# Patient Record
Sex: Female | Born: 1955 | Race: White | Hispanic: No | Marital: Married | Smoking: Former smoker
Health system: Southern US, Community
[De-identification: ages and names within clinical notes are randomized; demographics above are authoritative.]

## PROBLEM LIST (undated history)

## (undated) DIAGNOSIS — Z8719 Personal history of other diseases of the digestive system: Secondary | ICD-10-CM

## (undated) DIAGNOSIS — Z8041 Family history of malignant neoplasm of ovary: Secondary | ICD-10-CM

## (undated) DIAGNOSIS — G43909 Migraine, unspecified, not intractable, without status migrainosus: Secondary | ICD-10-CM

## (undated) DIAGNOSIS — E785 Hyperlipidemia, unspecified: Secondary | ICD-10-CM

## (undated) DIAGNOSIS — I10 Essential (primary) hypertension: Secondary | ICD-10-CM

## (undated) DIAGNOSIS — Z803 Family history of malignant neoplasm of breast: Secondary | ICD-10-CM

## (undated) DIAGNOSIS — K219 Gastro-esophageal reflux disease without esophagitis: Secondary | ICD-10-CM

## (undated) DIAGNOSIS — E119 Type 2 diabetes mellitus without complications: Secondary | ICD-10-CM

## (undated) HISTORY — PX: BREAST LUMPECTOMY: SHX2

## (undated) HISTORY — PX: TUBAL LIGATION: SHX77

## (undated) HISTORY — DX: Hyperlipidemia, unspecified: E78.5

## (undated) HISTORY — DX: Family history of malignant neoplasm of ovary: Z80.41

## (undated) HISTORY — DX: Family history of malignant neoplasm of breast: Z80.3

## (undated) HISTORY — PX: EXCISION OF BREAST BIOPSY: SHX5822

---

## 1999-04-14 HISTORY — PX: BREAST BIOPSY: SHX20

## 1999-06-19 HISTORY — PX: BREAST EXCISIONAL BIOPSY: SUR124

## 1999-06-19 HISTORY — PX: BREAST BIOPSY: SHX20

## 2004-09-25 ENCOUNTER — Ambulatory Visit: Payer: Self-pay | Admitting: Family Medicine

## 2006-05-20 ENCOUNTER — Ambulatory Visit: Payer: Self-pay | Admitting: Family Medicine

## 2007-07-19 ENCOUNTER — Ambulatory Visit: Payer: Self-pay | Admitting: Family Medicine

## 2008-11-01 ENCOUNTER — Ambulatory Visit: Payer: Self-pay | Admitting: Family Medicine

## 2009-02-15 ENCOUNTER — Ambulatory Visit: Payer: Self-pay | Admitting: Unknown Physician Specialty

## 2009-02-15 HISTORY — PX: COLONOSCOPY: SHX174

## 2009-12-03 ENCOUNTER — Ambulatory Visit: Payer: Self-pay | Admitting: Family Medicine

## 2010-12-11 ENCOUNTER — Ambulatory Visit: Payer: Self-pay | Admitting: Family Medicine

## 2014-06-20 IMAGING — US US BREAST*R* LIMITED INC AXILLA
1 series · 7 of 7 positions shown · non-contrast
Comparison: [DATE]

CLINICAL DATA: The patient returns after screening study for
evaluation of possible asymmetry is in the right breast.

EXAM:
2D DIGITAL DIAGNOSTIC RIGHT MAMMOGRAM WITH CAD AND ADJUNCT TOMO
ULTRASOUND RIGHT BREAST

[Series 1: us breast*right* limited inc axilla · 0.07mm/px · 7 of 7 slices shown]
[im 1/7]
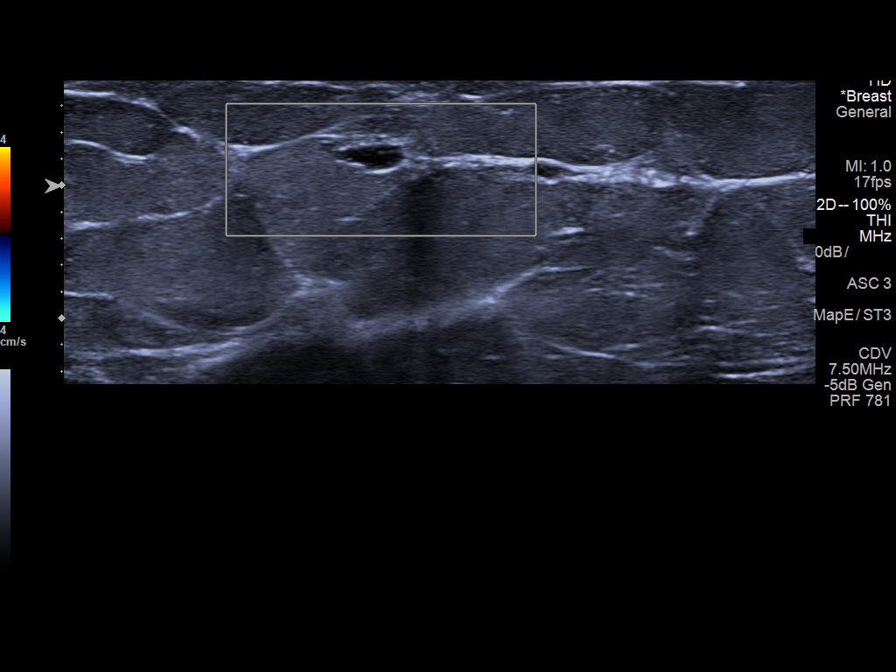
[im 2/7]
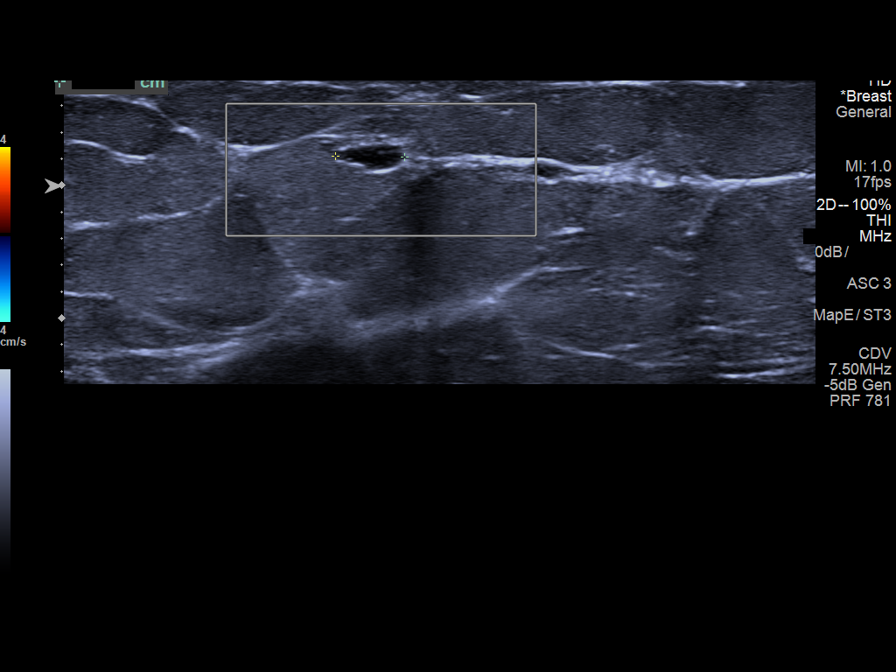
[im 3/7]
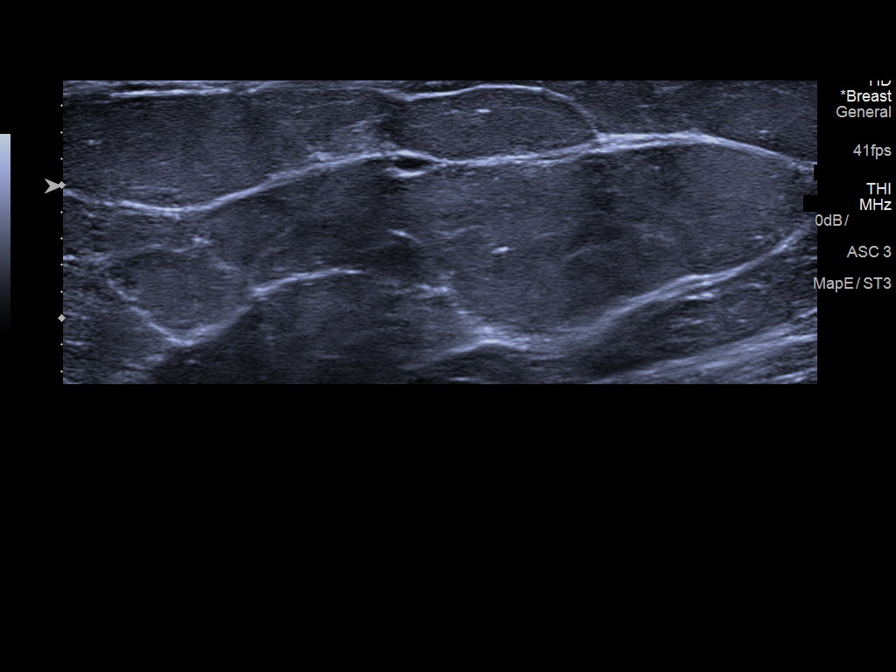
[im 4/7]
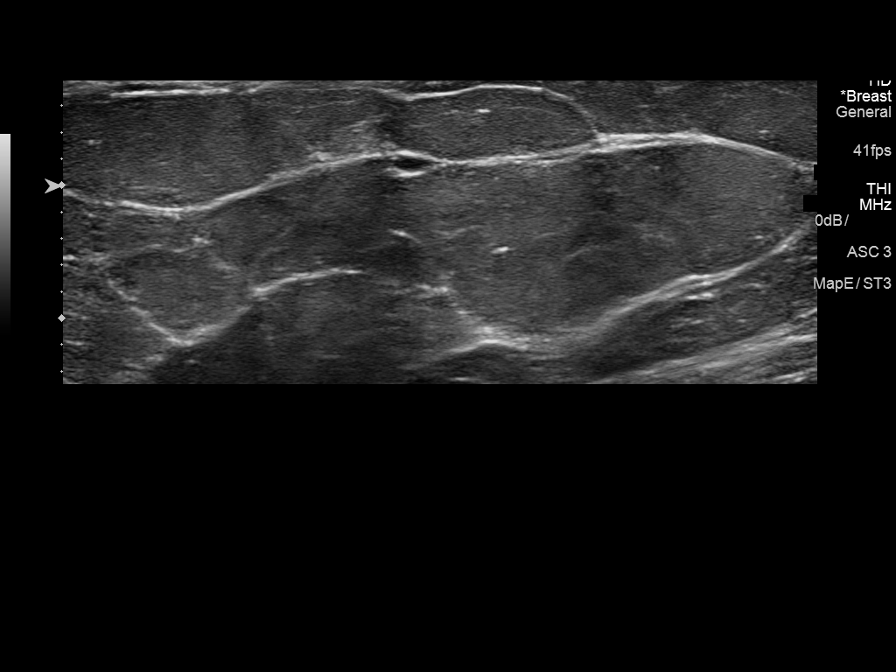
[im 5/7]
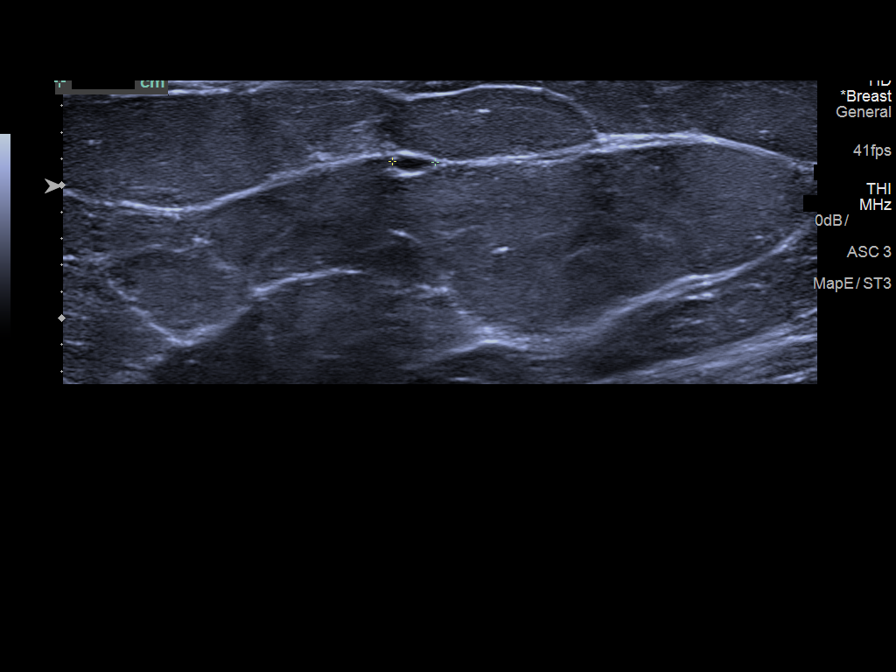
[im 6/7]
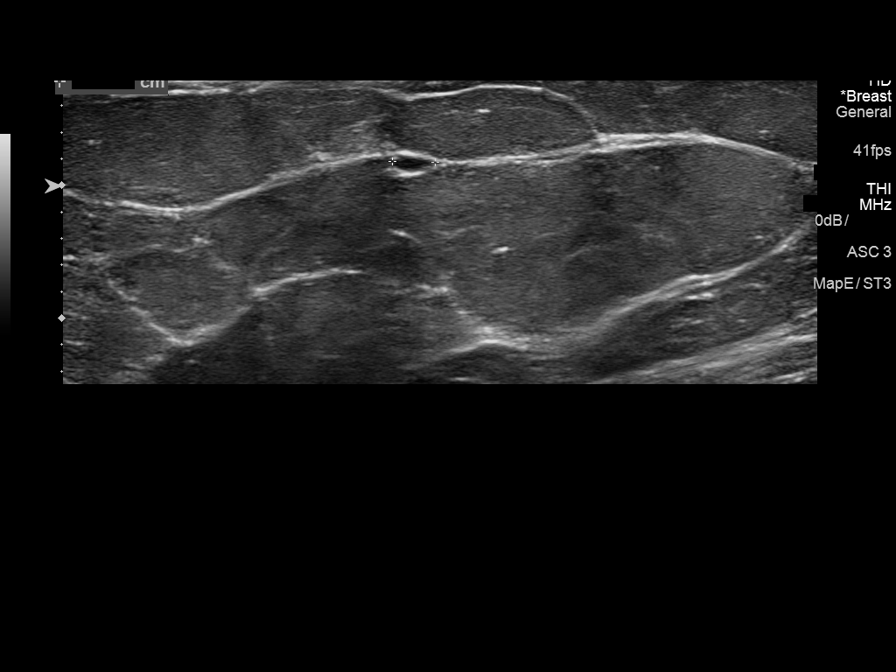
[im 7/7]
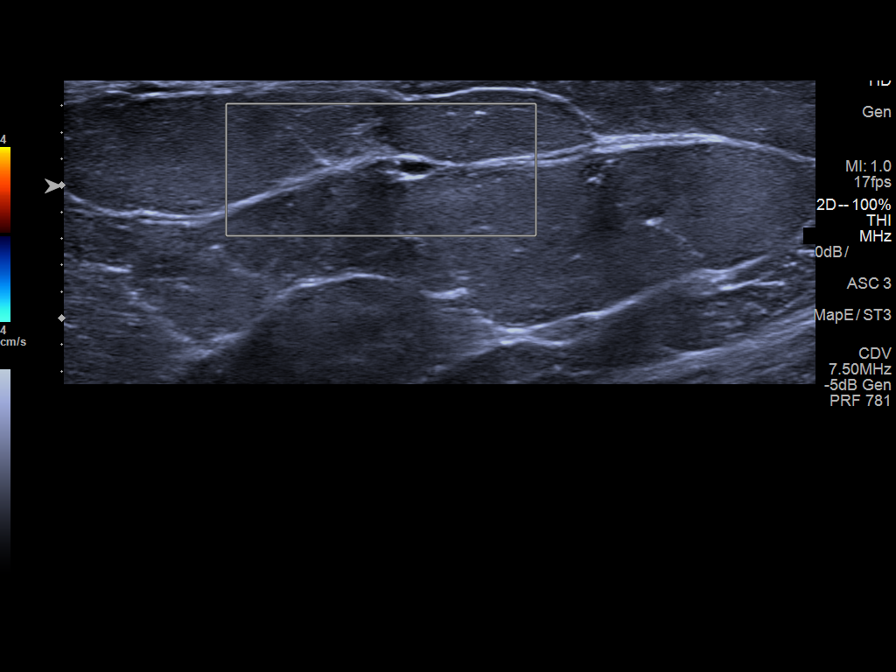

[7 of 7 positions shown; findings below may reference images not displayed]

ACR Breast Density Category b: There are scattered areas of
fibroglandular density.
FINDINGS: Additional tomosynthesis images are performed, showing persistent
circumscribed mass in the superficial medial portion of the right
breast. The second area of concern is shown represent normal
appearing breast parenchyma in the more central portion of the
breast.

Mammographic images were processed with CAD.

On physical exam, I palpate no abnormality in the medial aspect of
the right breast.

Targeted ultrasound is performed, showing a small oval hypoechoic
mass in the 3 o'clock location of the right breast 3 cm from the
nipple which measures 0.5 x 0.3 cm. Internal echoes are present. No
internal blood flow identified on Doppler evaluation. Lesion is
parallel and associated with increased through transmission.
Findings favor benign fibrocystic change/ apocrine metaplasia.
IMPRESSION: Persistent abnormality in the medial aspect of the right breast
likely represents benign apocrine metaplasia or fibrocystic changes.
Followup is recommended to document stability.

RECOMMENDATION:
Right breast ultrasound is suggested in 6 months.

I have discussed the findings and recommendations with the patient.
Results were also provided in writing at the conclusion of the
visit. If applicable, a reminder letter will be sent to the patient
regarding the next appointment.

BI-RADS CATEGORY  3: Probably benign.

## 2015-07-31 ENCOUNTER — Other Ambulatory Visit: Payer: Self-pay | Admitting: Family Medicine

## 2015-07-31 DIAGNOSIS — Z1231 Encounter for screening mammogram for malignant neoplasm of breast: Secondary | ICD-10-CM

## 2015-08-07 ENCOUNTER — Ambulatory Visit
Admission: RE | Admit: 2015-08-07 | Discharge: 2015-08-07 | Disposition: A | Discharge status: Home / Self Care | Payer: Managed Care, Other (non HMO) | Source: Ambulatory Visit | Attending: Family Medicine | Admitting: Family Medicine

## 2015-08-07 DIAGNOSIS — Z1231 Encounter for screening mammogram for malignant neoplasm of breast: Secondary | ICD-10-CM | POA: Insufficient documentation

## 2015-08-07 DIAGNOSIS — R928 Other abnormal and inconclusive findings on diagnostic imaging of breast: Secondary | ICD-10-CM | POA: Insufficient documentation

## 2015-08-07 IMAGING — MG MM DIGITAL SCREENING BILAT W/ CAD
2 series · 5 of 5 positions shown · non-contrast
Comparison: Previous exam(s).

CLINICAL DATA: Screening.

EXAM:
DIGITAL SCREENING BILATERAL MAMMOGRAM WITH CAD

[R CC · right · 4 of 4 slices shown (1 of 2)]
[im 1/4]
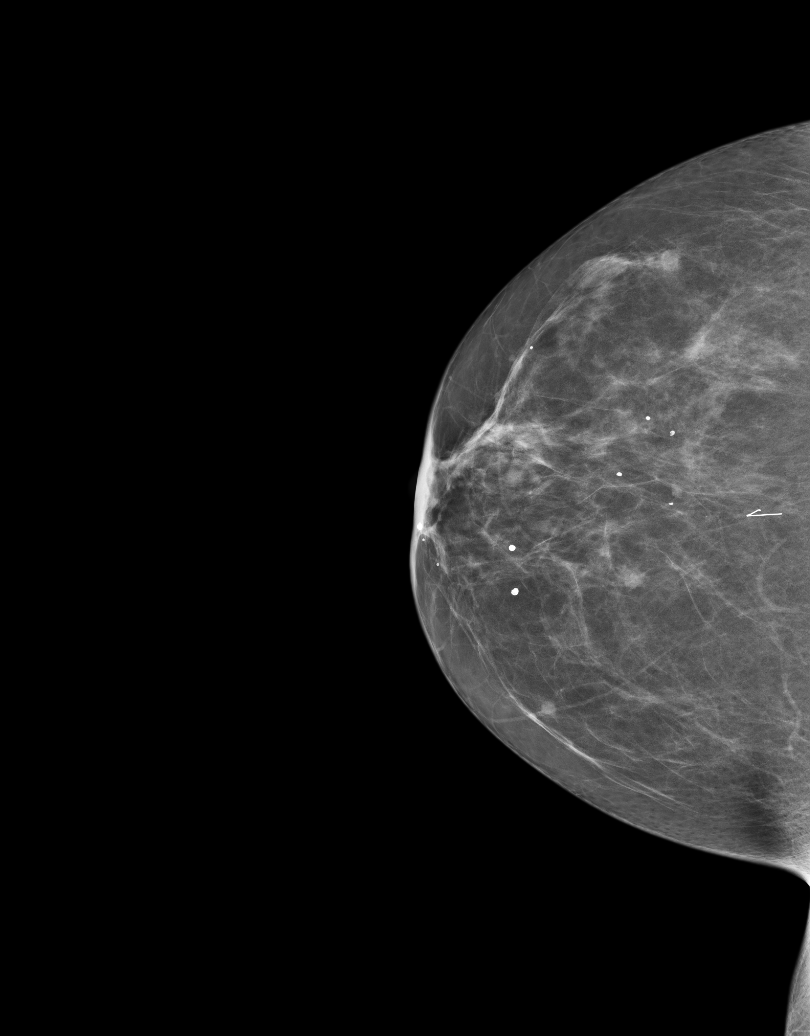
[im 2/4]
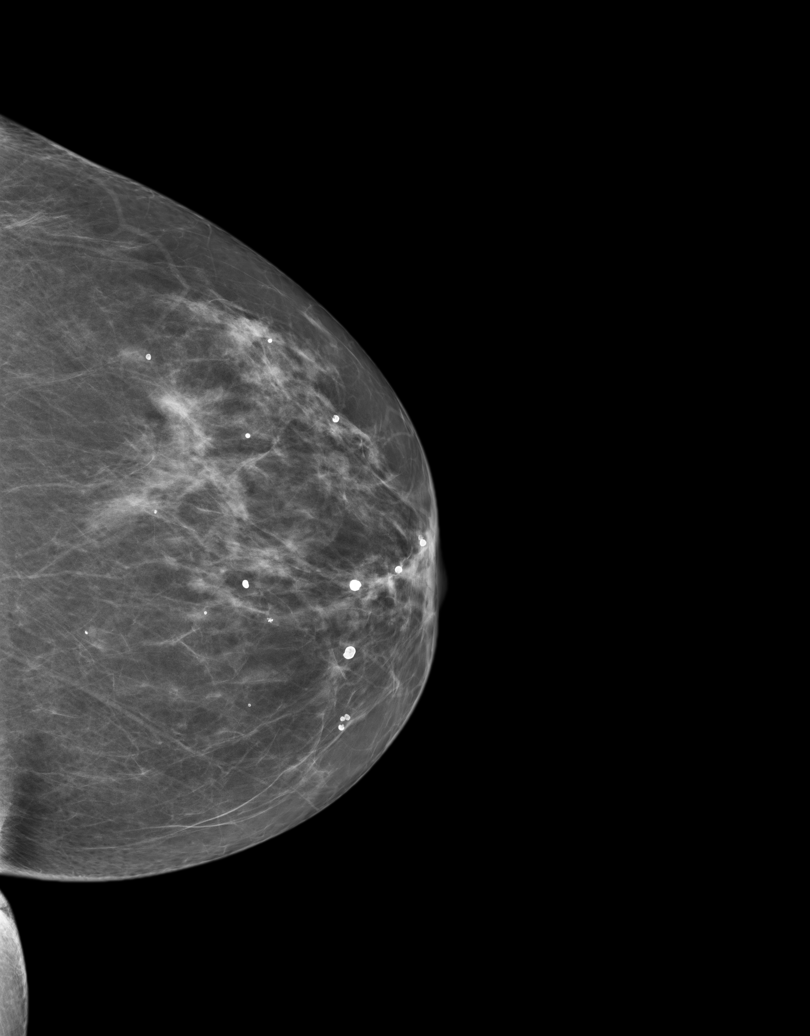
[im 3/4]
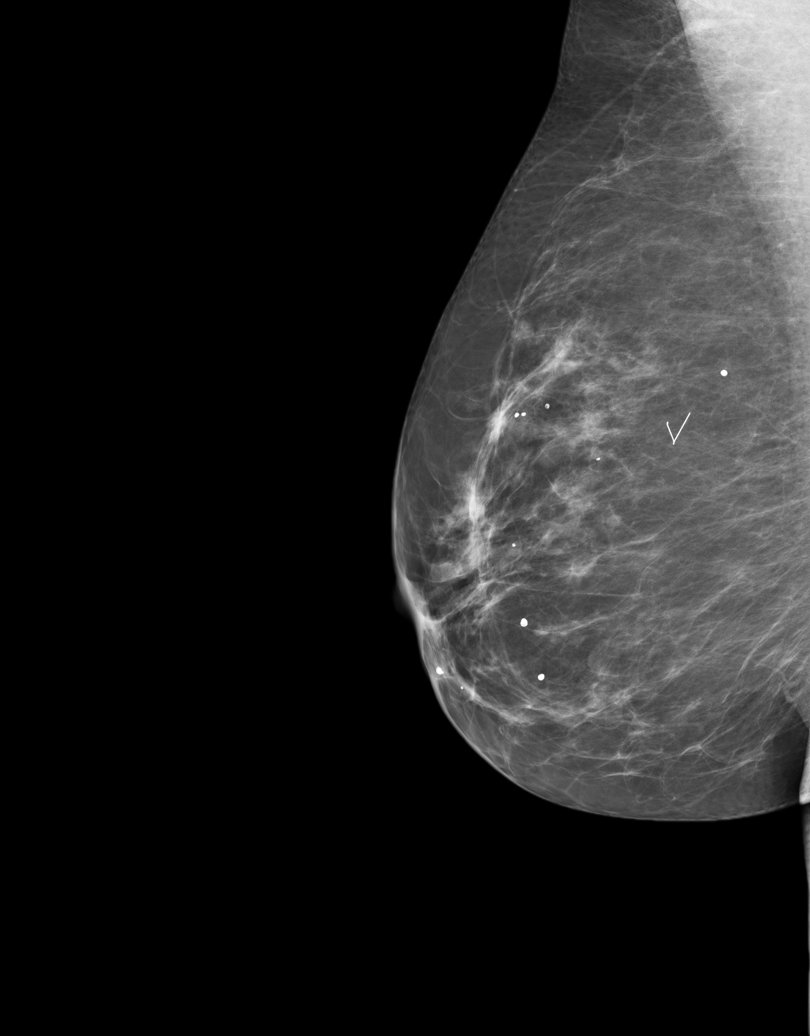
[im 4/4]
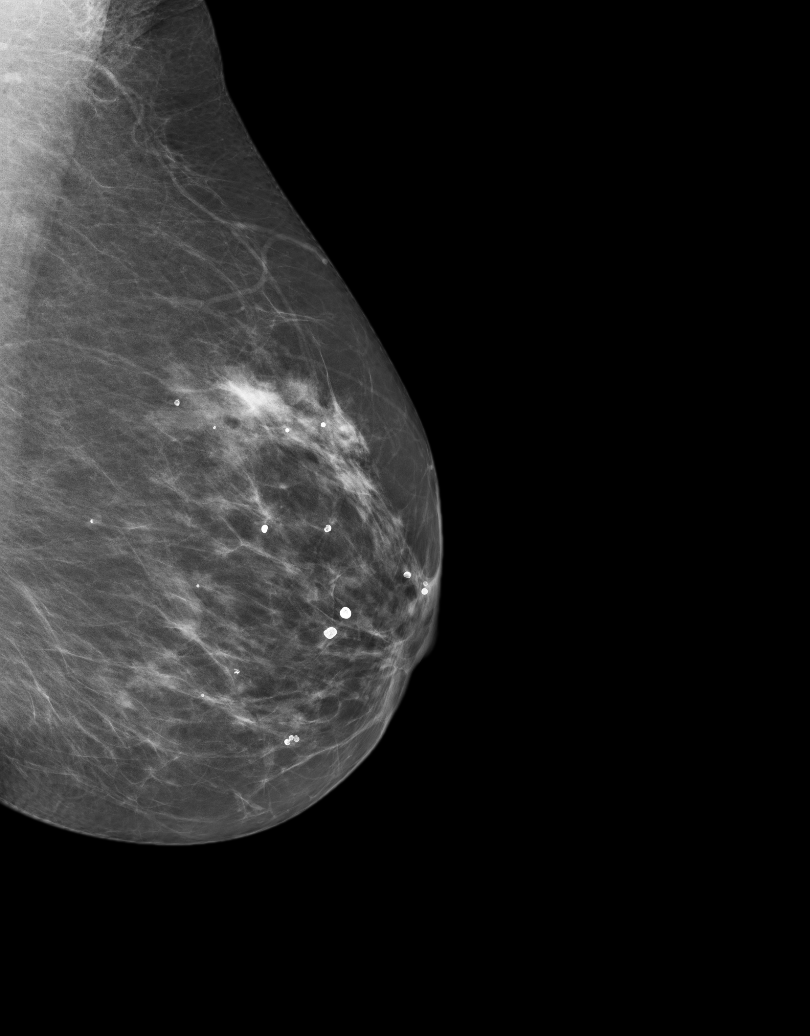

[R CC (2 of 2)]
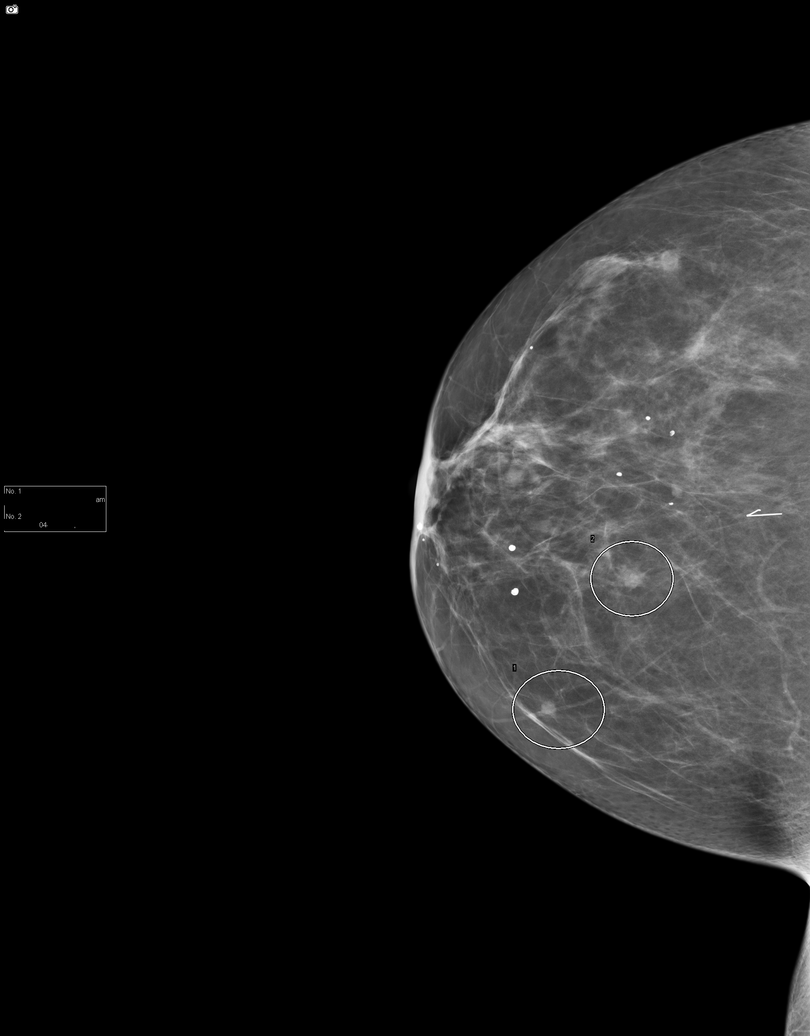

[5 of 5 positions shown; findings below may reference images not displayed]

ACR Breast Density Category b: There are scattered areas of
fibroglandular density.
FINDINGS: In the right breast, possible asymmetries warrant further
evaluation. In the left breast, no findings suspicious for
malignancy. Images were processed with CAD.
IMPRESSION: Further evaluation is suggested for possible asymmetries in the
right breast.

RECOMMENDATION:
Diagnostic mammogram and possibly ultrasound of the right breast.
(Code:[H2])

The patient will be contacted regarding the findings, and additional
imaging will be scheduled.

BI-RADS CATEGORY  0: Incomplete. Need additional imaging evaluation
and/or prior mammograms for comparison.

## 2015-08-26 ENCOUNTER — Other Ambulatory Visit: Payer: Self-pay | Admitting: Family Medicine

## 2015-08-26 DIAGNOSIS — R928 Other abnormal and inconclusive findings on diagnostic imaging of breast: Secondary | ICD-10-CM

## 2015-09-02 ENCOUNTER — Ambulatory Visit
Admission: RE | Admit: 2015-09-02 | Discharge: 2015-09-02 | Disposition: A | Discharge status: Home / Self Care | Payer: Managed Care, Other (non HMO) | Source: Ambulatory Visit | Attending: Family Medicine | Admitting: Family Medicine

## 2015-09-02 DIAGNOSIS — R928 Other abnormal and inconclusive findings on diagnostic imaging of breast: Secondary | ICD-10-CM

## 2015-09-02 DIAGNOSIS — N63 Unspecified lump in breast: Secondary | ICD-10-CM | POA: Insufficient documentation

## 2015-09-02 IMAGING — MG MM DIGITAL DIAGNOSTIC UNILAT*R* W/ TOMO W/ CAD
6 series · 6 of 14 positions shown · non-contrast
Comparison: [DATE]

CLINICAL DATA: The patient returns after screening study for
evaluation of possible asymmetry is in the right breast.

EXAM:
2D DIGITAL DIAGNOSTIC RIGHT MAMMOGRAM WITH CAD AND ADJUNCT TOMO
ULTRASOUND RIGHT BREAST

[R MLO synth-2D]
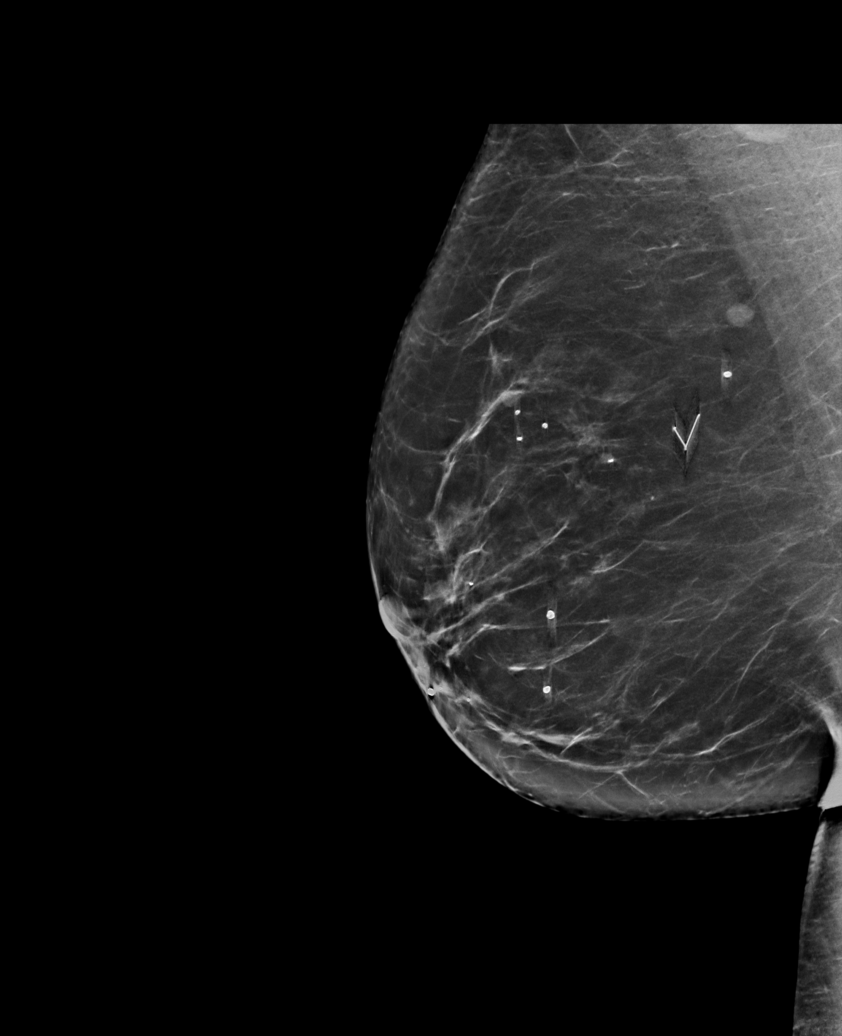

[R CC]
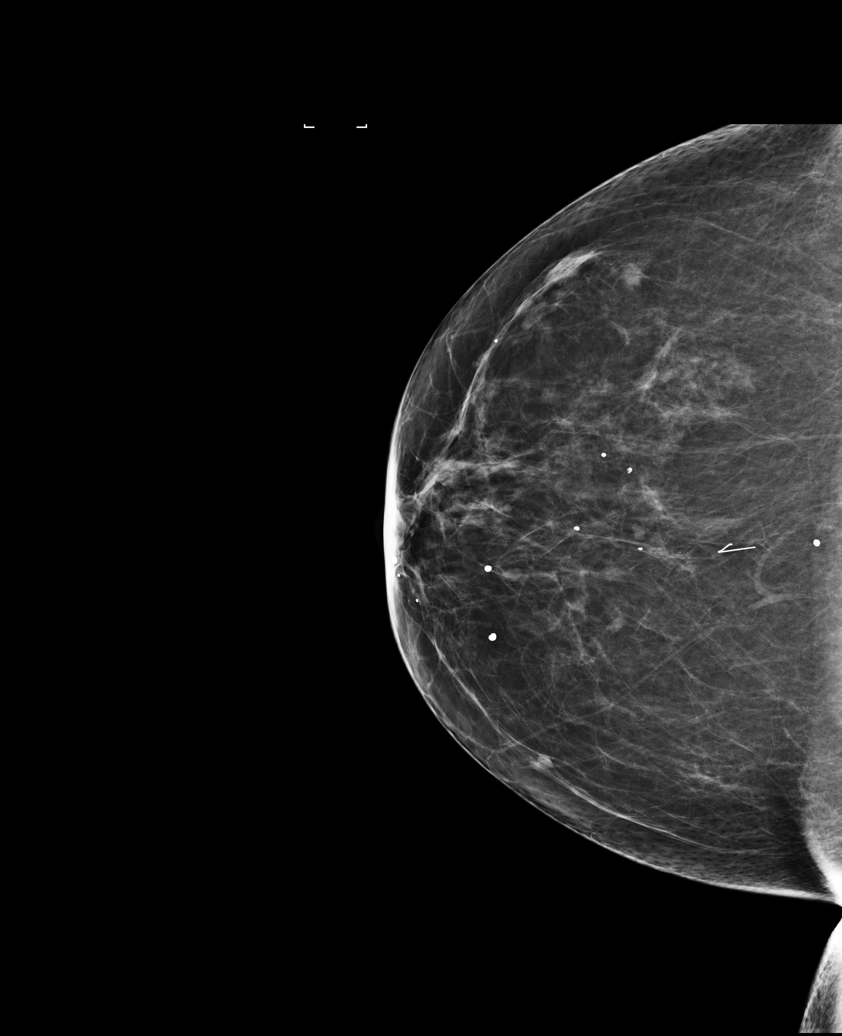

[R CC synth-2D]
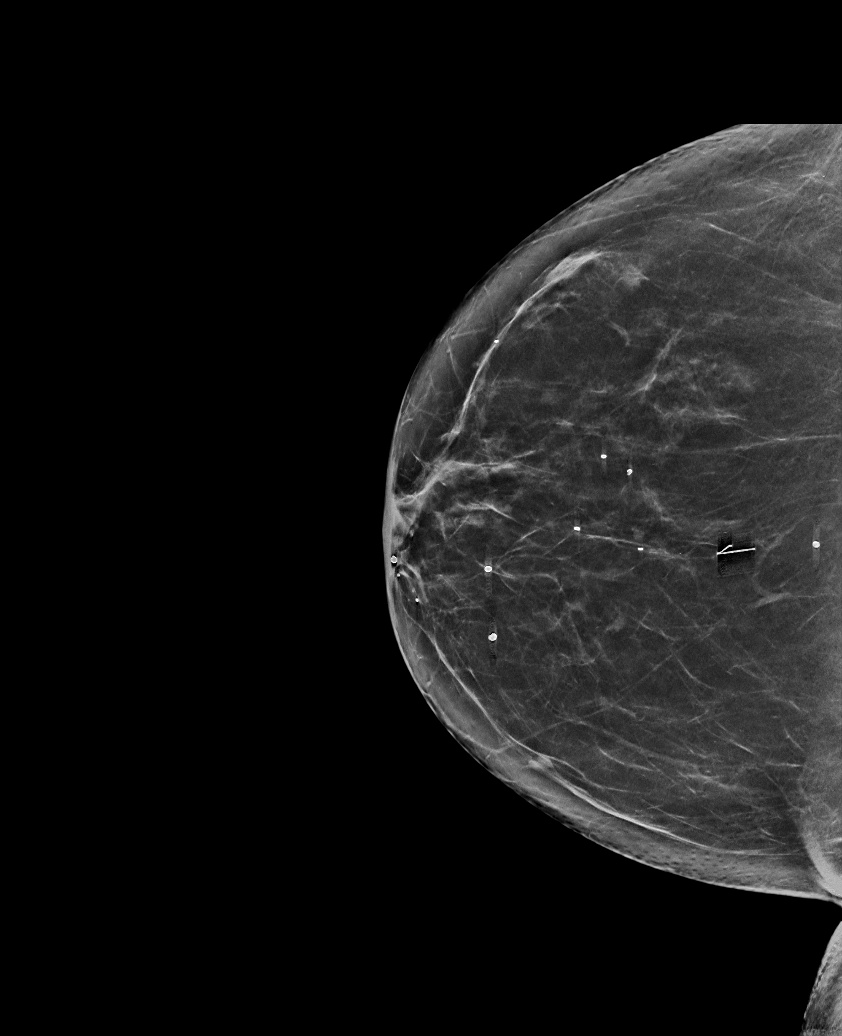

[R MLO]
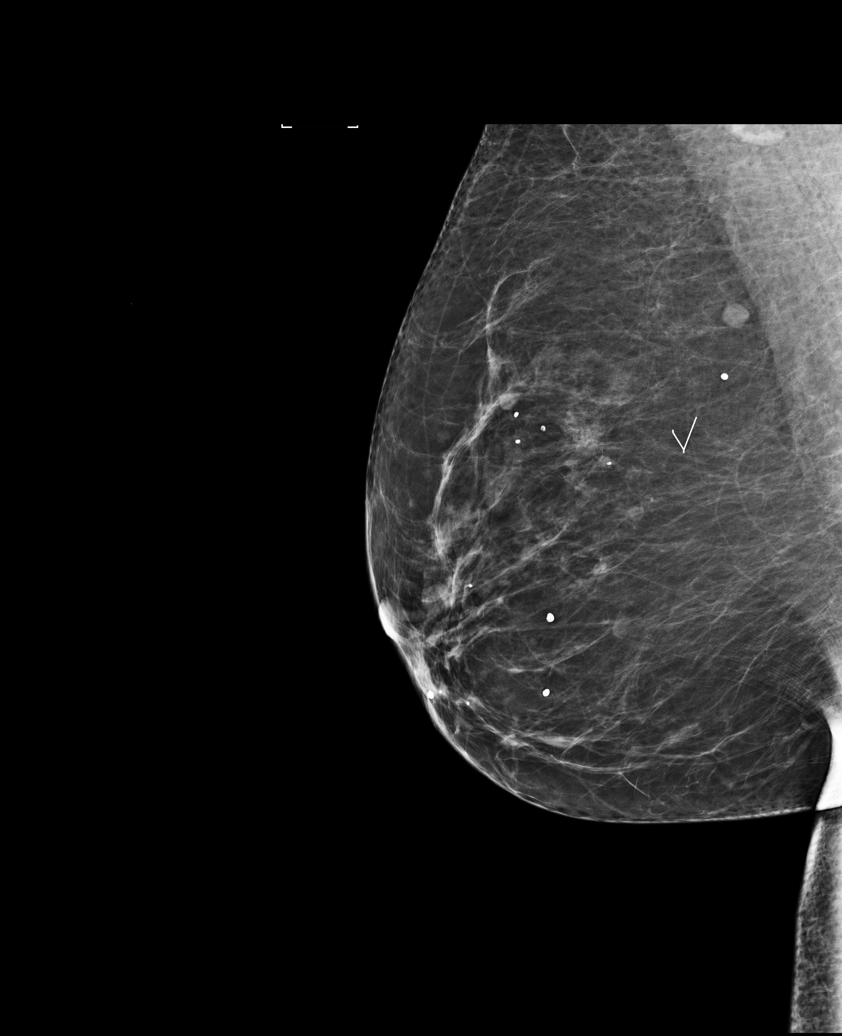

[R MLO tomo · tomo slice 41/80.0]
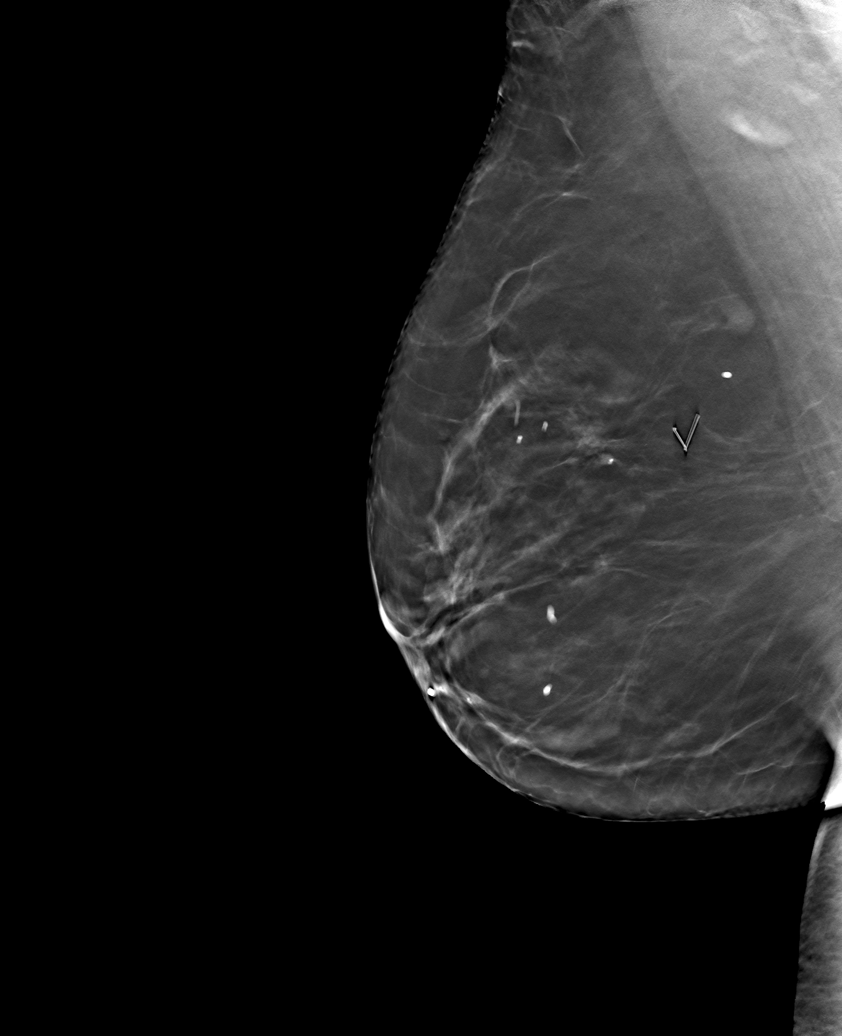

[R CC tomo · tomo slice 35/69.0]
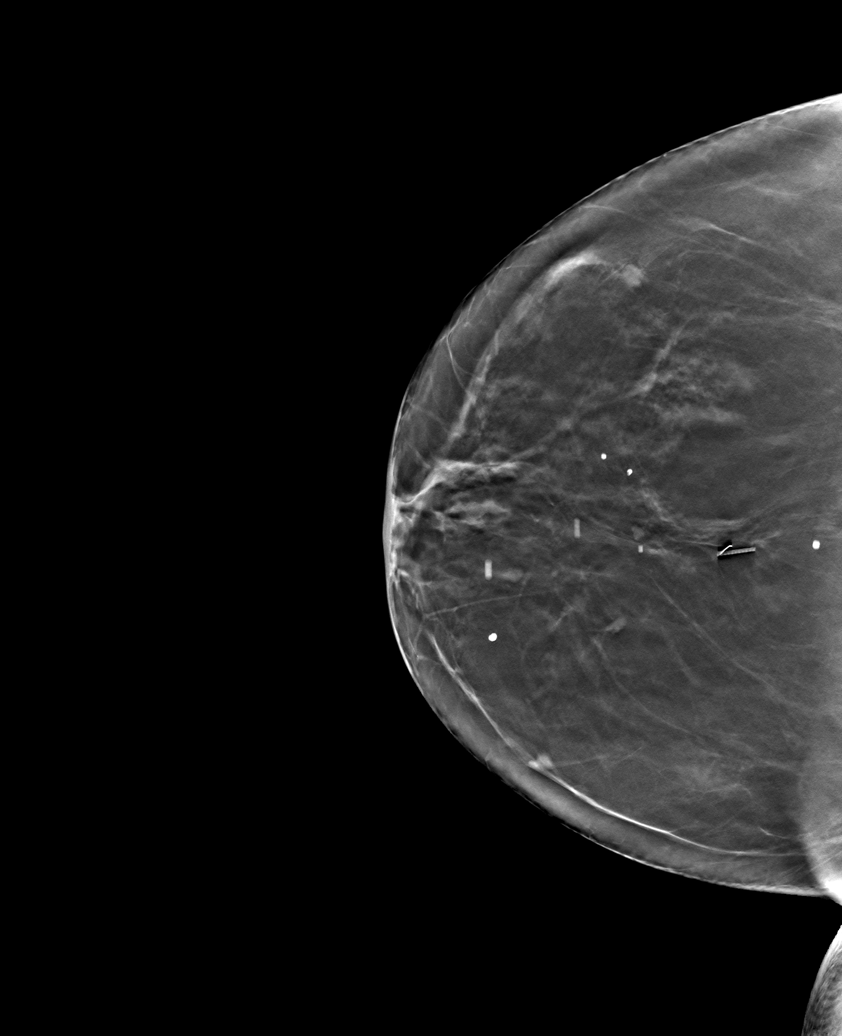

[6 of 14 positions shown; findings below may reference images not displayed]

ACR Breast Density Category b: There are scattered areas of
fibroglandular density.
FINDINGS: Additional tomosynthesis images are performed, showing persistent
circumscribed mass in the superficial medial portion of the right
breast. The second area of concern is shown represent normal
appearing breast parenchyma in the more central portion of the
breast.

Mammographic images were processed with CAD.

On physical exam, I palpate no abnormality in the medial aspect of
the right breast.

Targeted ultrasound is performed, showing a small oval hypoechoic
mass in the 3 o'clock location of the right breast 3 cm from the
nipple which measures 0.5 x 0.3 cm. Internal echoes are present. No
internal blood flow identified on Doppler evaluation. Lesion is
parallel and associated with increased through transmission.
Findings favor benign fibrocystic change/ apocrine metaplasia.
IMPRESSION: Persistent abnormality in the medial aspect of the right breast
likely represents benign apocrine metaplasia or fibrocystic changes.
Followup is recommended to document stability.

RECOMMENDATION:
Right breast ultrasound is suggested in 6 months.

I have discussed the findings and recommendations with the patient.
Results were also provided in writing at the conclusion of the
visit. If applicable, a reminder letter will be sent to the patient
regarding the next appointment.

BI-RADS CATEGORY  3: Probably benign.

## 2016-08-03 ENCOUNTER — Other Ambulatory Visit: Payer: Self-pay | Admitting: Family Medicine

## 2016-08-03 DIAGNOSIS — Z1231 Encounter for screening mammogram for malignant neoplasm of breast: Secondary | ICD-10-CM

## 2016-08-11 ENCOUNTER — Other Ambulatory Visit: Payer: Self-pay | Admitting: Family Medicine

## 2016-08-11 DIAGNOSIS — N6019 Diffuse cystic mastopathy of unspecified breast: Secondary | ICD-10-CM

## 2017-03-16 ENCOUNTER — Ambulatory Visit
Admission: RE | Admit: 2017-03-16 | Discharge: 2017-03-16 | Disposition: A | Discharge status: Home / Self Care | Payer: Managed Care, Other (non HMO) | Source: Ambulatory Visit | Attending: Family Medicine | Admitting: Family Medicine

## 2017-03-16 DIAGNOSIS — N6019 Diffuse cystic mastopathy of unspecified breast: Secondary | ICD-10-CM

## 2017-03-16 IMAGING — MG 2D DIGITAL DIAGNOSTIC BILATERAL MAMMOGRAM WITH CAD AND ADJUNCT T
8 of 12 series · 8 of 28 positions shown · non-contrast
Comparison: Previous exam(s).

CLINICAL DATA: Follow-up for probably benign mass within the inner
right breast. This mass was originally identified on screening
mammogram of [DATE]. Diagnostic report of [DATE] described
this mass as a probably benign finding, likely apocrine metaplasia
or fibrocystic change, for which a follow-up diagnostic exam was
recommended in 6 months to ensure stability. Patient returns today
for the follow-up diagnostic exam.

EXAM:
2D DIGITAL DIAGNOSTIC BILATERAL MAMMOGRAM WITH CAD AND ADJUNCT TOMO

[R CC]
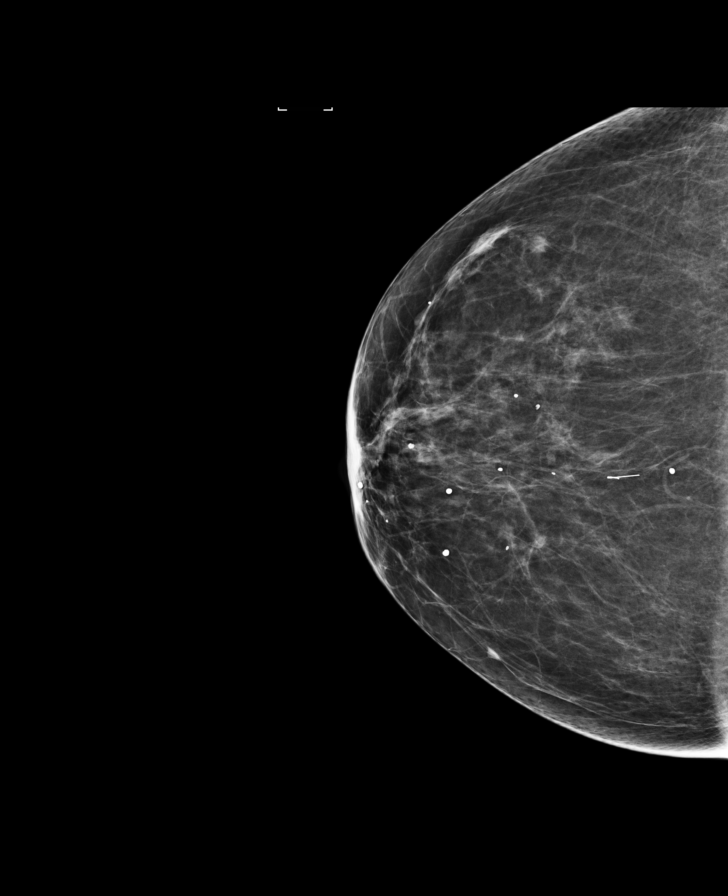

[L MLO]
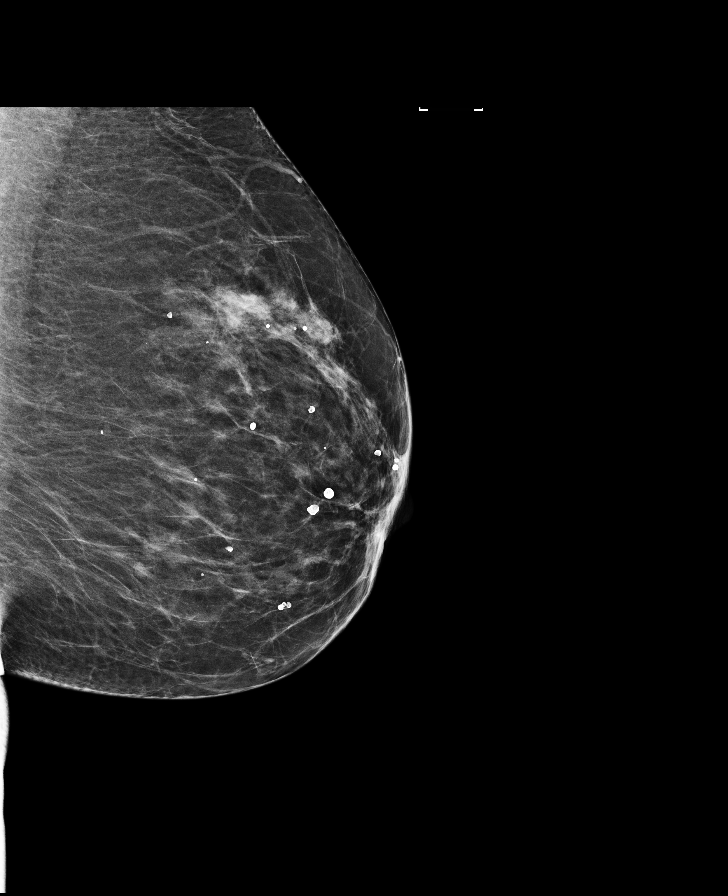

[L MLO synth-2D]
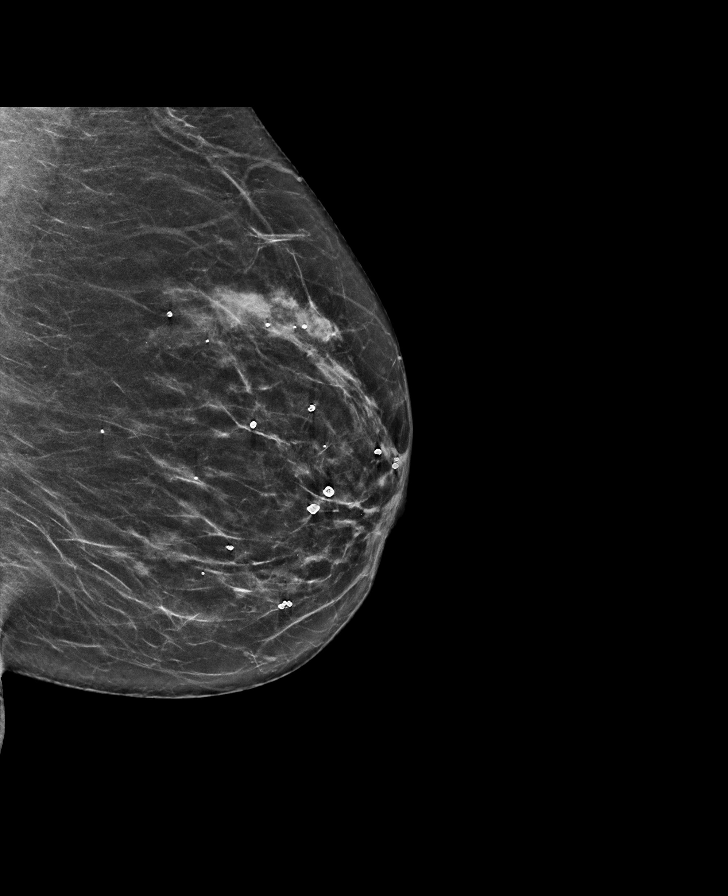

[R CC synth-2D]
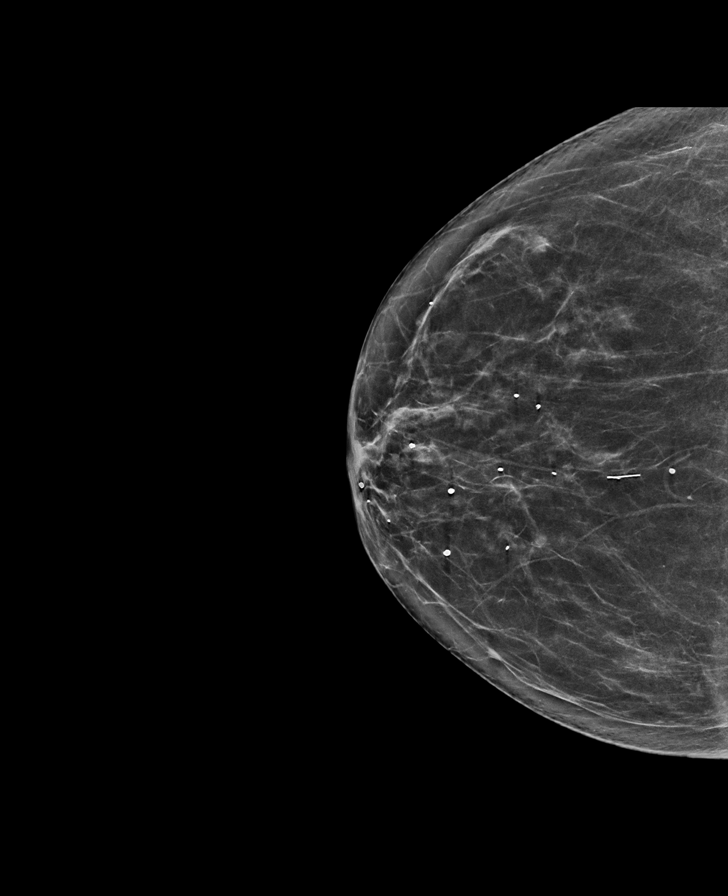

[L CC synth-2D]
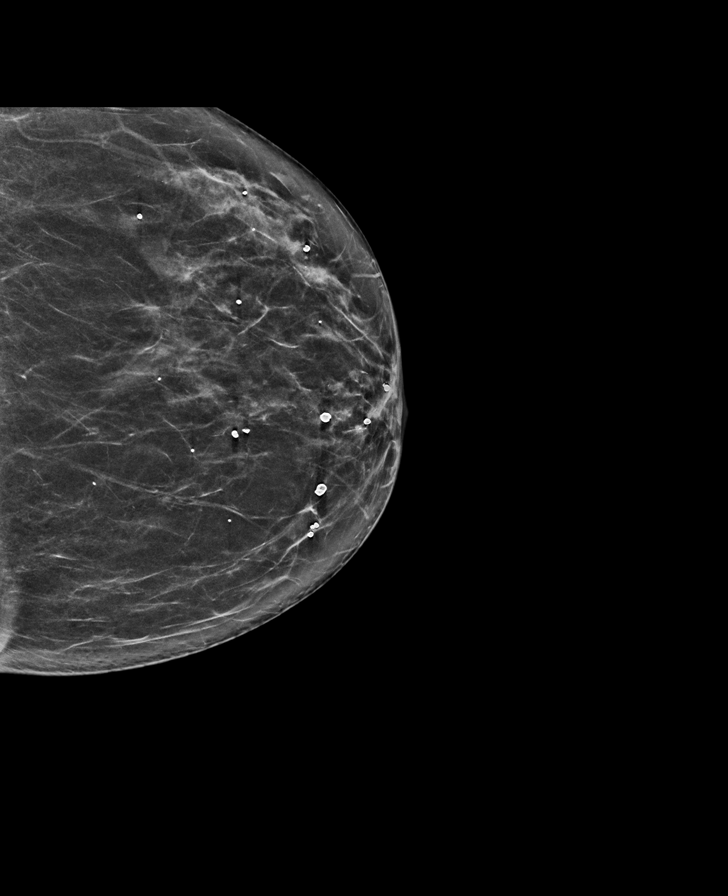

[R MLO]
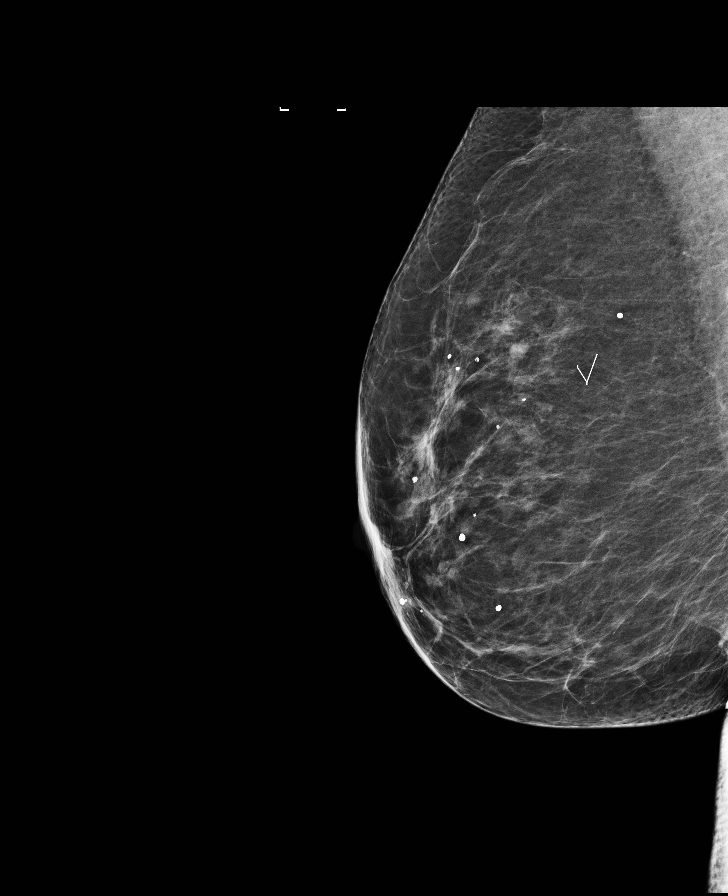

[R MLO synth-2D]
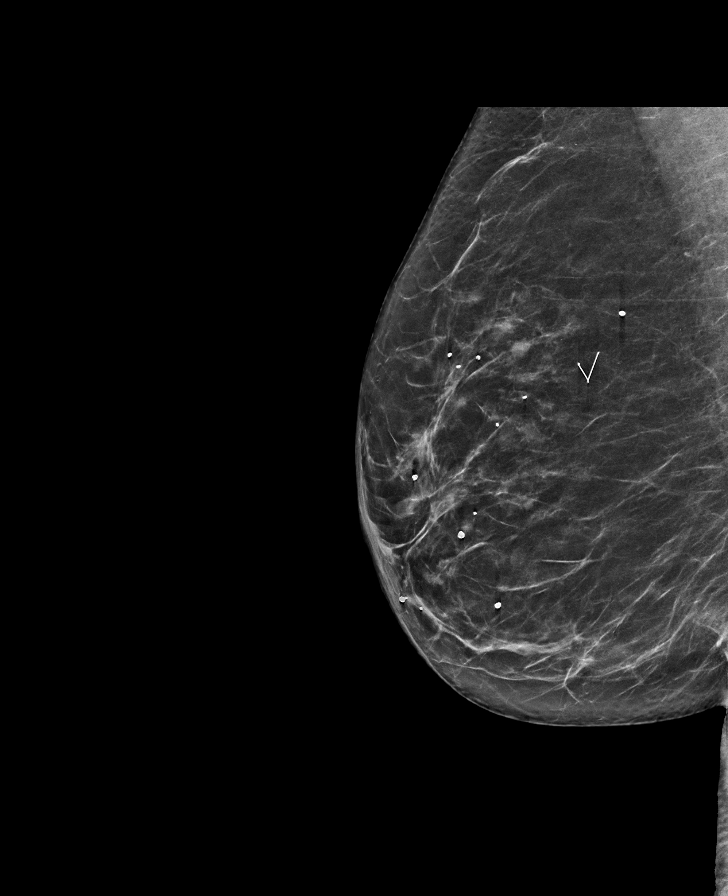

[L CC]
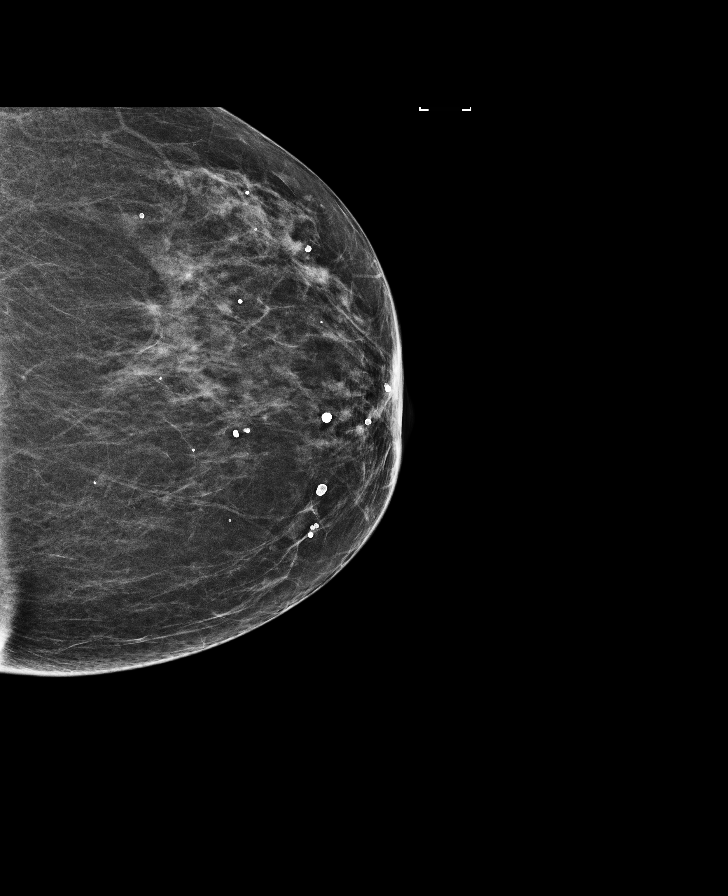

[8 of 28 positions shown; findings below may reference images not displayed]

ACR Breast Density Category b: There are scattered areas of
fibroglandular density.
FINDINGS: The previously questioned mass within the inner right breast is
stable, or perhaps slightly smaller, indicating benignity. There are
no new dominant masses, suspicious calcifications or secondary signs
of malignancy identified within either breast.

Mammographic images were processed with CAD.
IMPRESSION: No evidence of malignancy within either breast. Patient may return
to routine annual bilateral screening mammogram schedule.

RECOMMENDATION:
Screening mammogram in one year.(Code:[39])

I have discussed the findings and recommendations with the patient.
Results were also provided in writing at the conclusion of the
visit. If applicable, a reminder letter will be sent to the patient
regarding the next appointment.

BI-RADS CATEGORY  2: Benign.

## 2017-03-17 ENCOUNTER — Other Ambulatory Visit: Payer: Managed Care, Other (non HMO)

## 2018-03-02 ENCOUNTER — Other Ambulatory Visit: Payer: Self-pay | Admitting: Family Medicine

## 2018-03-02 DIAGNOSIS — Z1231 Encounter for screening mammogram for malignant neoplasm of breast: Secondary | ICD-10-CM

## 2018-03-21 ENCOUNTER — Encounter (INDEPENDENT_AMBULATORY_CARE_PROVIDER_SITE_OTHER): Payer: Self-pay

## 2018-03-21 ENCOUNTER — Ambulatory Visit
Admission: RE | Admit: 2018-03-21 | Discharge: 2018-03-21 | Disposition: A | Discharge status: Home / Self Care | Payer: BLUE CROSS/BLUE SHIELD | Source: Ambulatory Visit | Attending: Family Medicine | Admitting: Family Medicine

## 2018-03-21 DIAGNOSIS — Z1231 Encounter for screening mammogram for malignant neoplasm of breast: Secondary | ICD-10-CM | POA: Diagnosis not present

## 2018-03-21 IMAGING — MG DIGITAL SCREENING BILATERAL MAMMOGRAM WITH TOMO AND CAD
8 series · 8 of 24 positions shown · non-contrast
Comparison: Previous exam(s).

CLINICAL DATA: Screening.

EXAM:
DIGITAL SCREENING BILATERAL MAMMOGRAM WITH TOMO AND CAD

[L CC synth-2D]
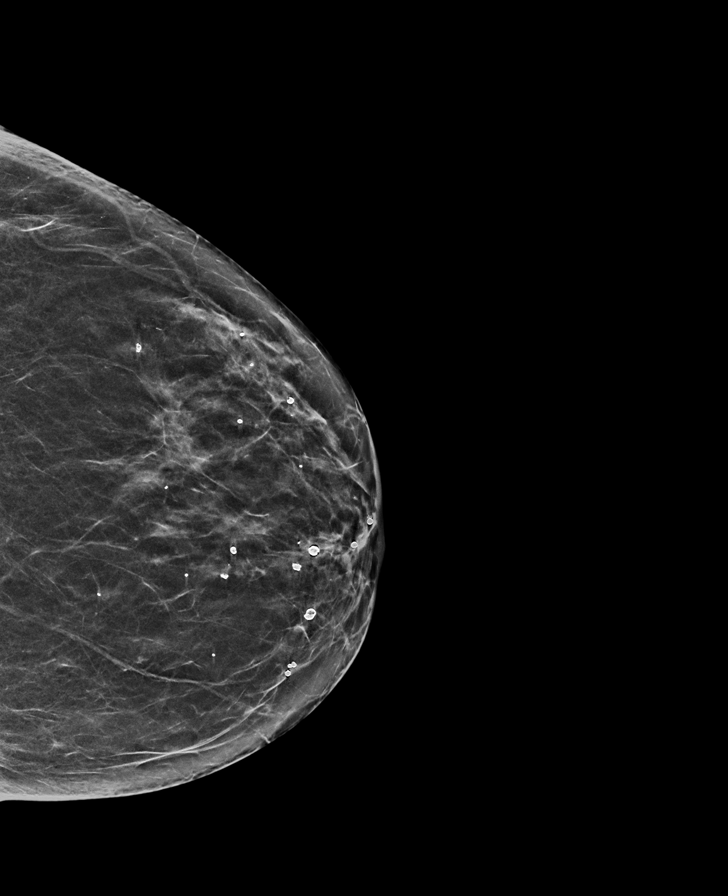

[L MLO synth-2D]
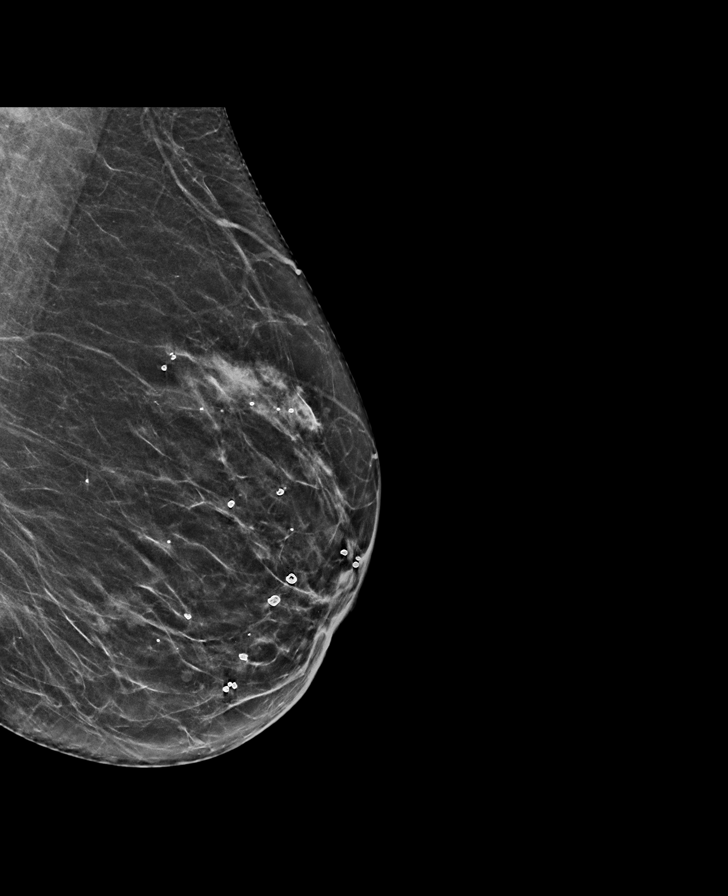

[R CC synth-2D]
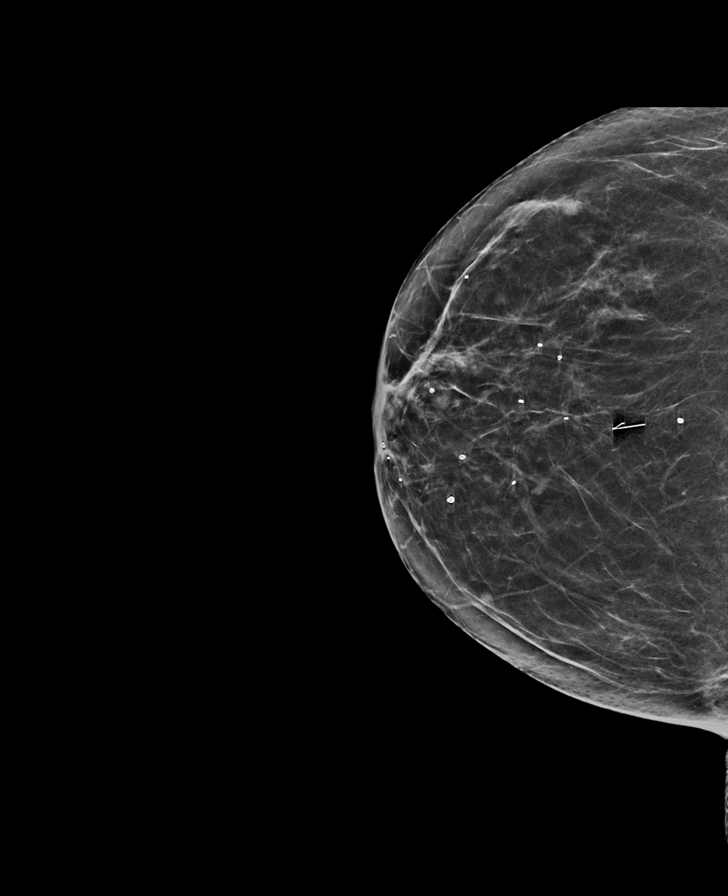

[R MLO synth-2D]
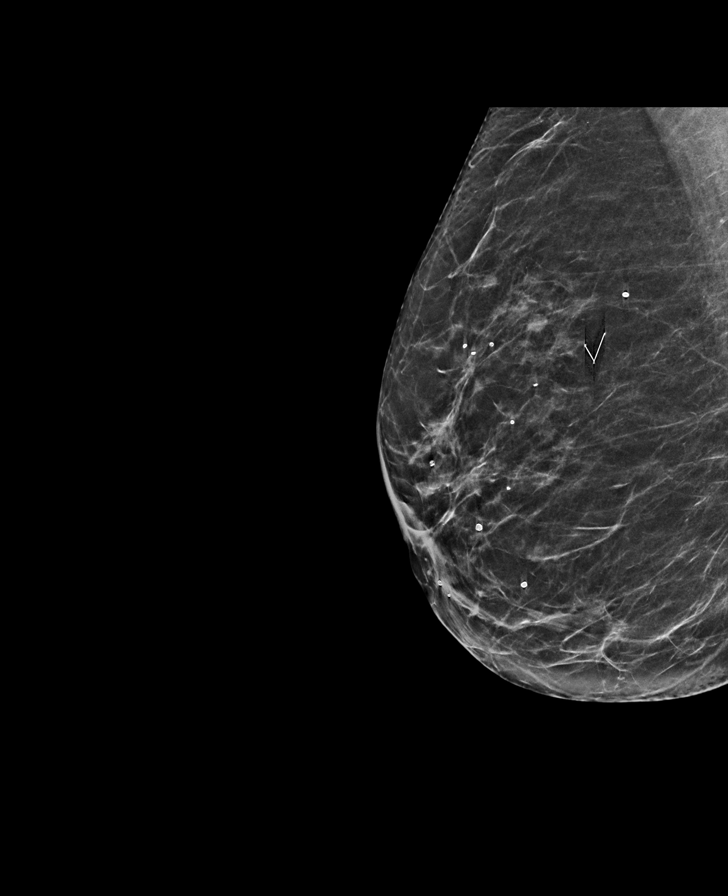

[R CC tomo · tomo slice 30/59.0]
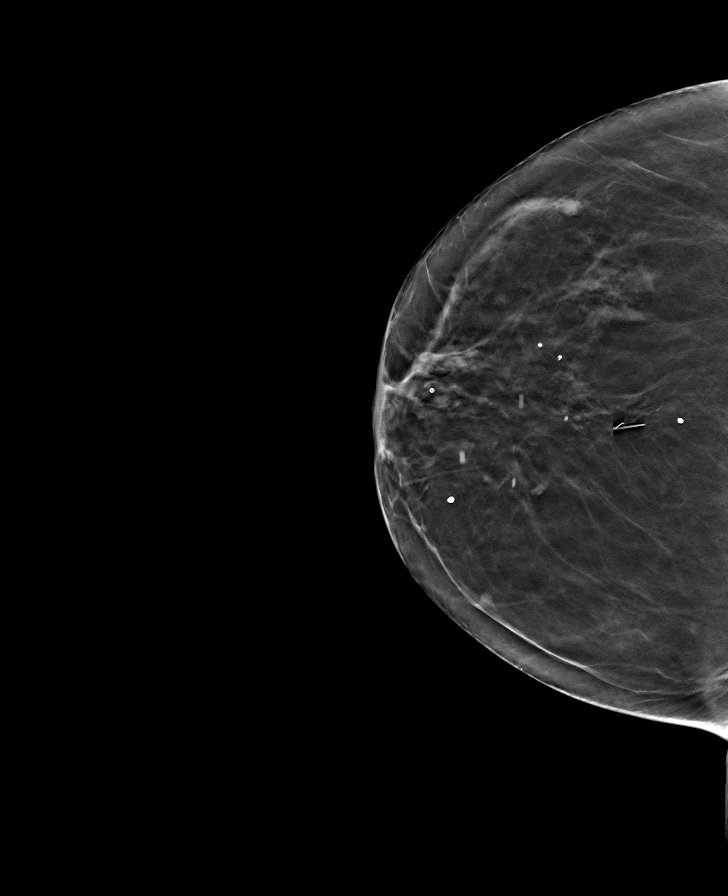

[L CC tomo · tomo slice 29/58.0]
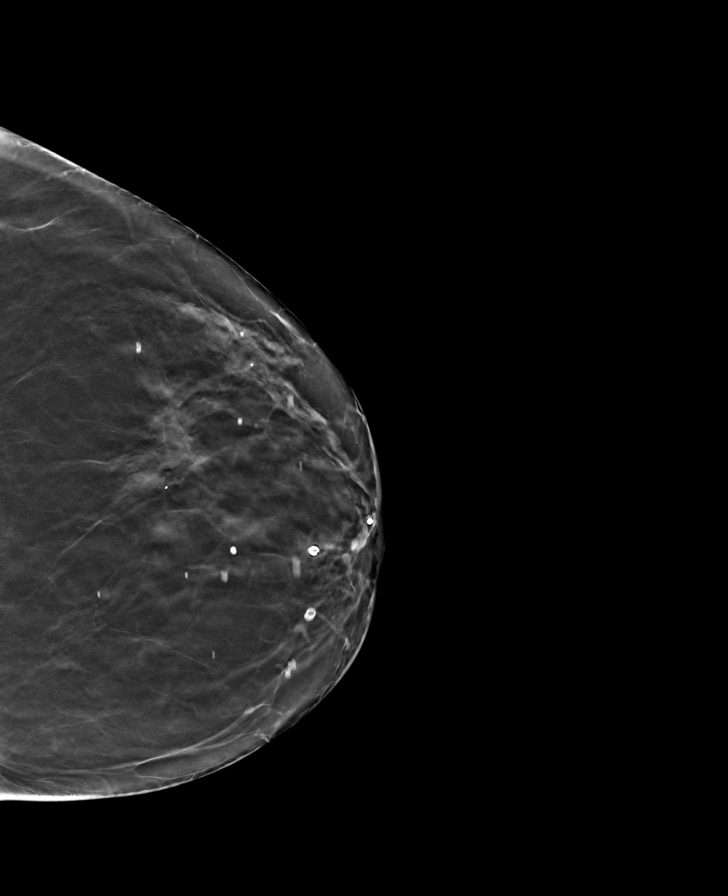

[L MLO tomo · tomo slice 31/60.0]
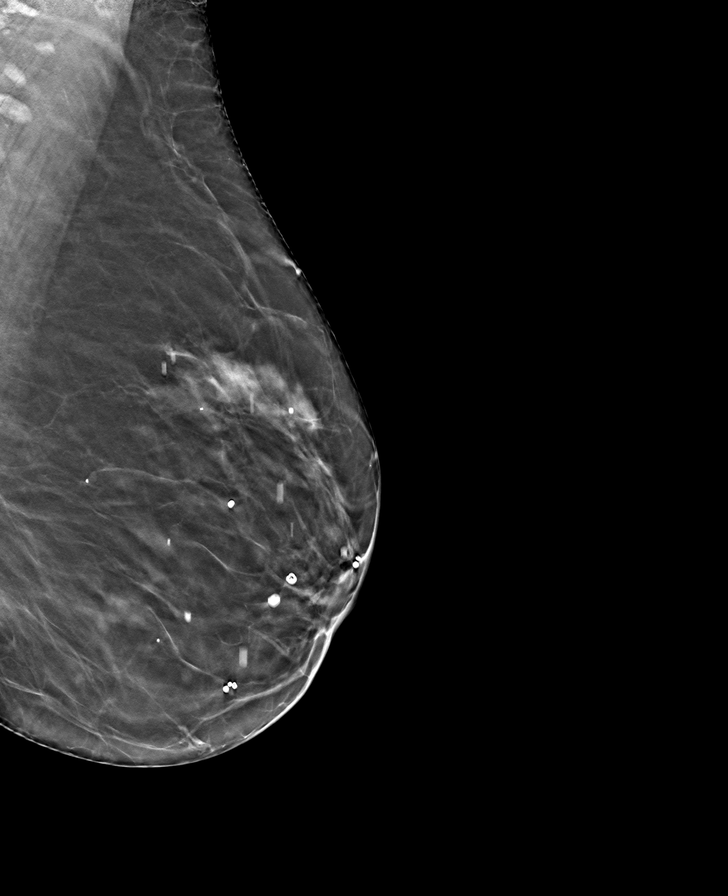

[R MLO tomo · tomo slice 30/59.0]
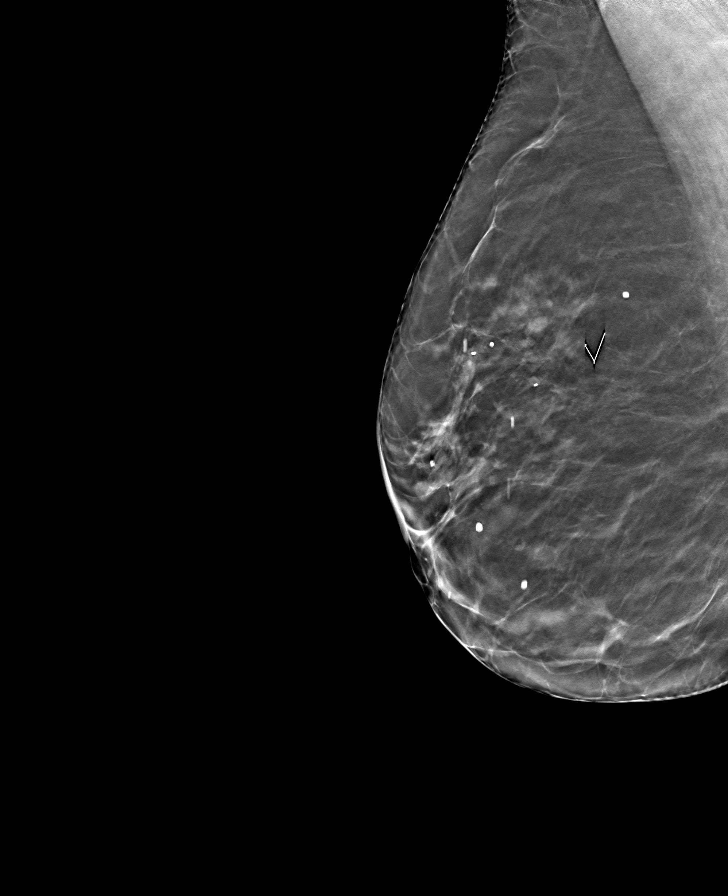

[8 of 24 positions shown; findings below may reference images not displayed]

ACR Breast Density Category b: There are scattered areas of
fibroglandular density.
FINDINGS: There are no findings suspicious for malignancy. Images were
processed with CAD.
IMPRESSION: No mammographic evidence of malignancy. A result letter of this
screening mammogram will be mailed directly to the patient.

RECOMMENDATION:
Screening mammogram in one year. (Code:[TQ])

BI-RADS CATEGORY  1: Negative.

## 2019-03-06 ENCOUNTER — Other Ambulatory Visit: Payer: Self-pay | Admitting: Family Medicine

## 2019-03-06 DIAGNOSIS — Z1231 Encounter for screening mammogram for malignant neoplasm of breast: Secondary | ICD-10-CM

## 2019-03-23 ENCOUNTER — Other Ambulatory Visit: Payer: Self-pay

## 2019-03-23 ENCOUNTER — Ambulatory Visit
Admission: RE | Admit: 2019-03-23 | Discharge: 2019-03-23 | Disposition: A | Discharge status: Home / Self Care | Payer: BLUE CROSS/BLUE SHIELD | Source: Ambulatory Visit | Attending: Family Medicine | Admitting: Family Medicine

## 2019-03-23 DIAGNOSIS — Z1231 Encounter for screening mammogram for malignant neoplasm of breast: Secondary | ICD-10-CM | POA: Insufficient documentation

## 2019-03-23 IMAGING — MG DIGITAL SCREENING BILAT W/ TOMO W/ CAD
8 series · 8 of 24 positions shown · non-contrast
Comparison: Previous exam(s).

CLINICAL DATA: Screening.

EXAM:
DIGITAL SCREENING BILATERAL MAMMOGRAM WITH TOMO AND CAD

[R CC synth-2D]
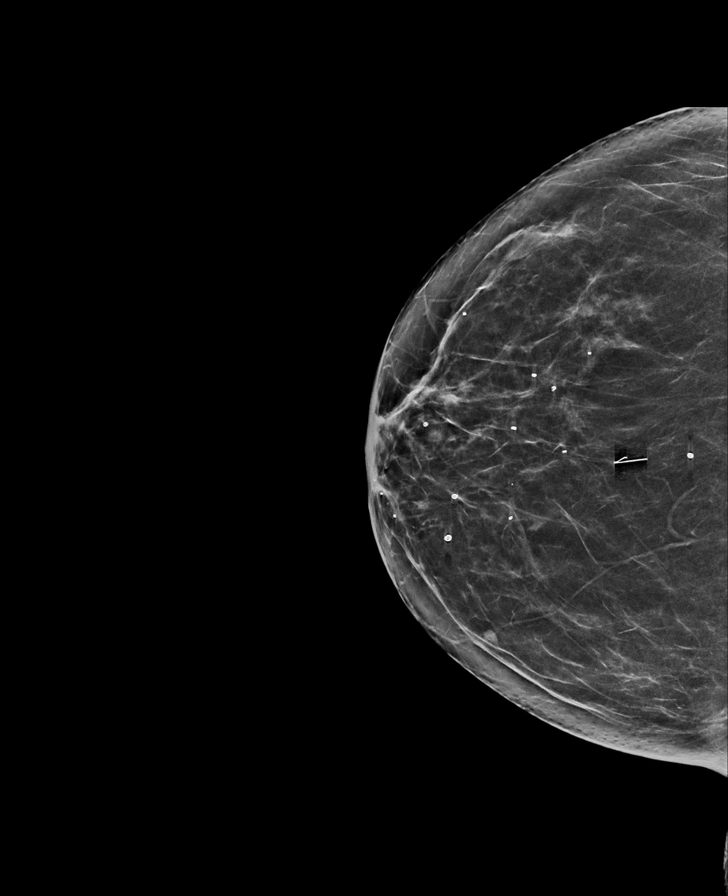

[L MLO synth-2D]
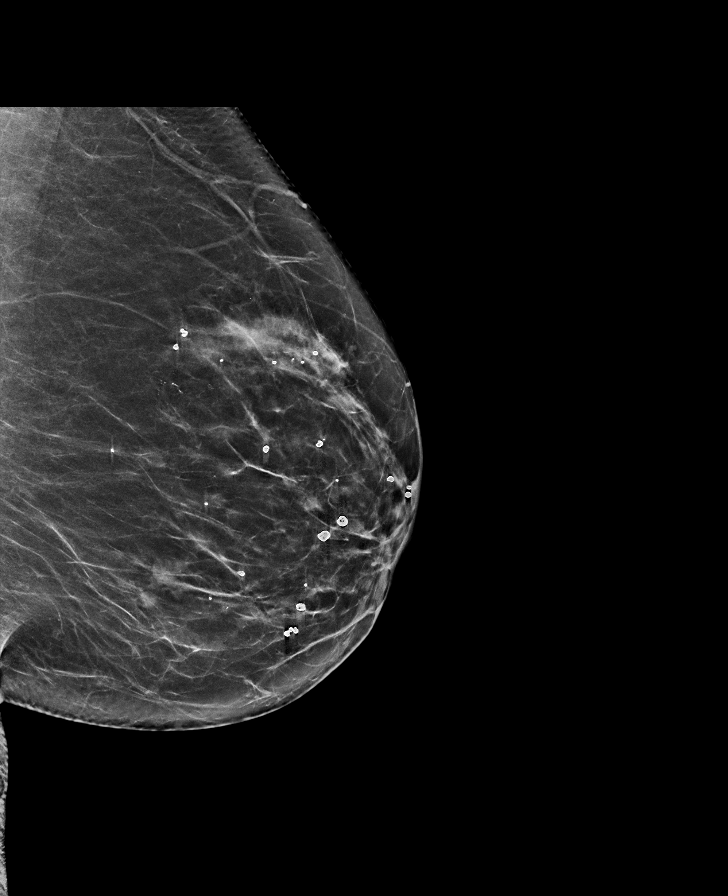

[R MLO synth-2D]
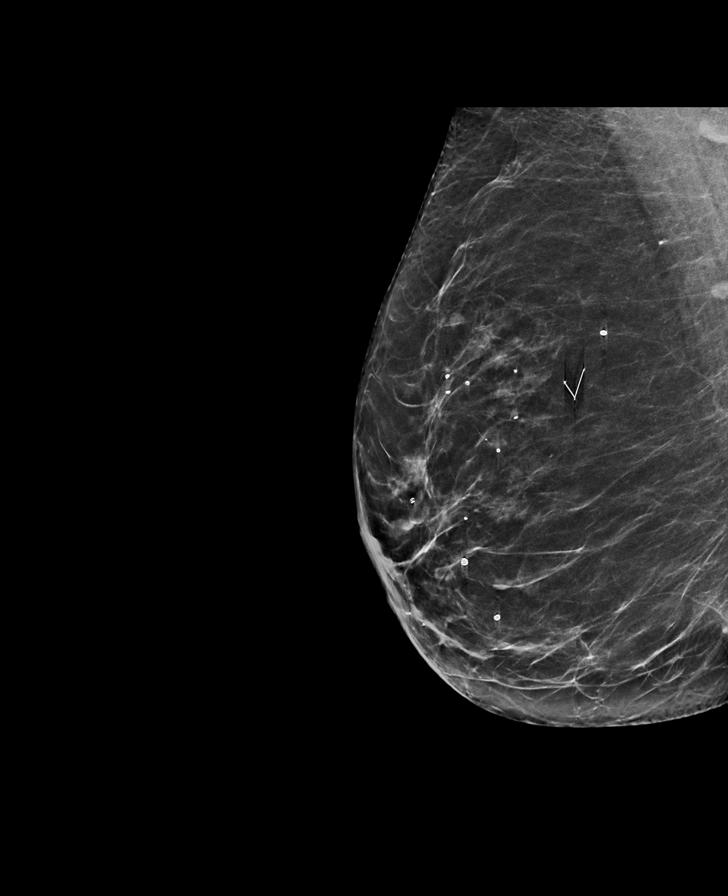

[L CC synth-2D]
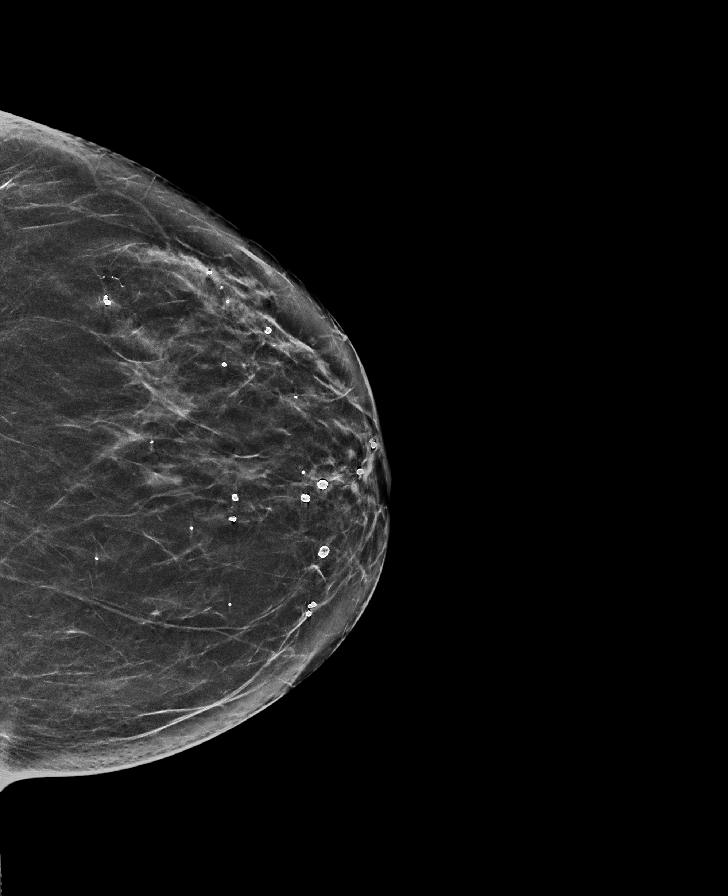

[R MLO tomo · tomo slice 35/68.0]
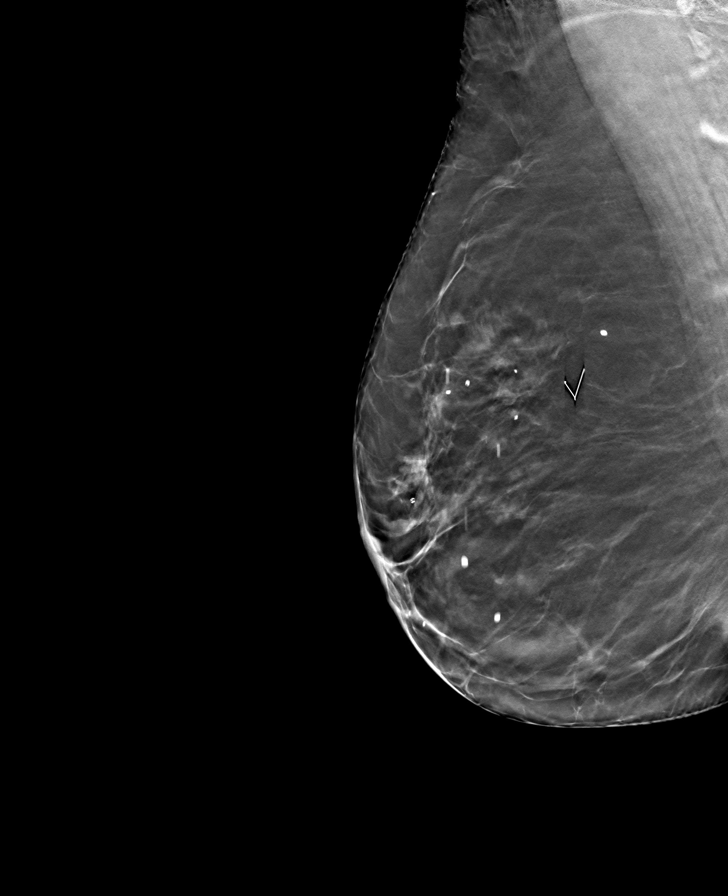

[L CC tomo · tomo slice 32/63.0]
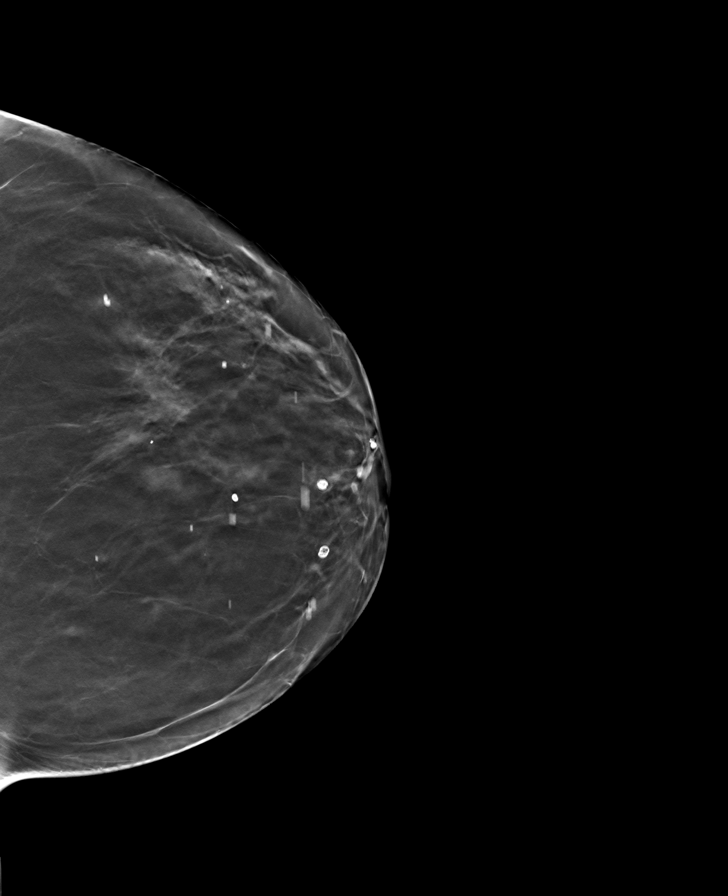

[R CC tomo · tomo slice 33/64.0]
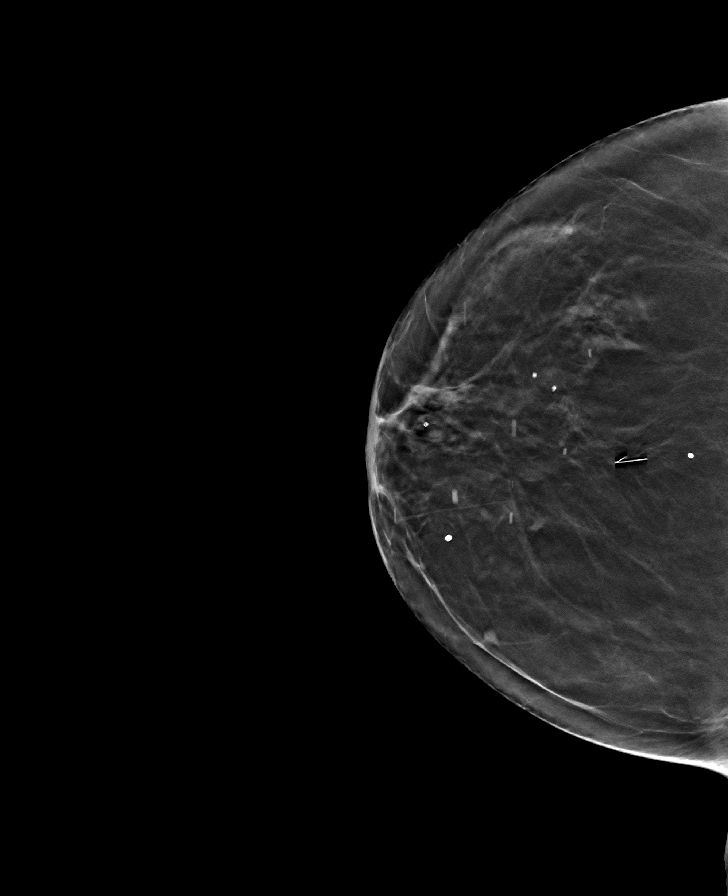

[L MLO tomo · tomo slice 34/67.0]
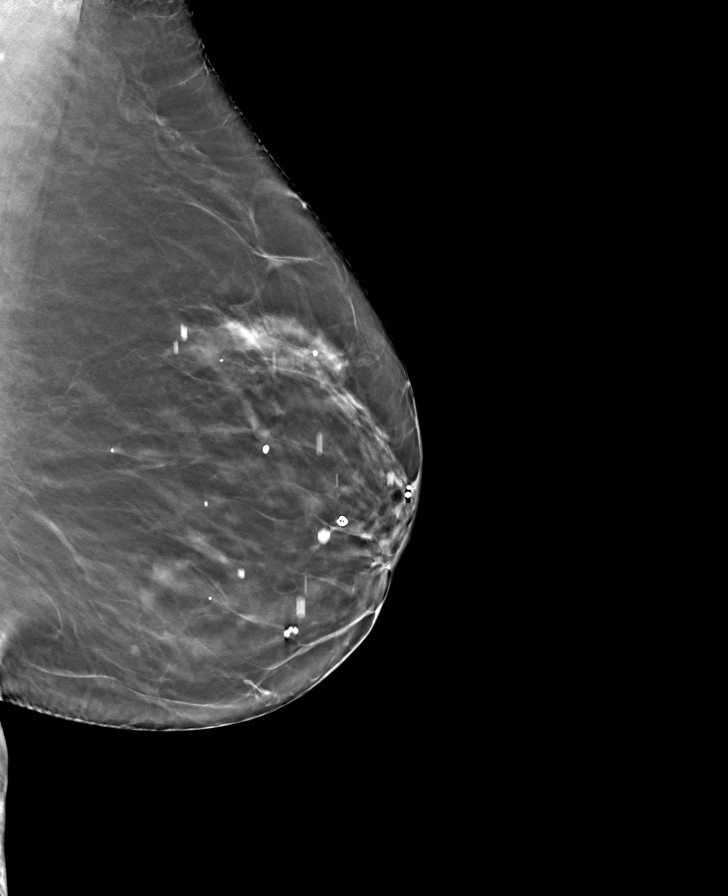

[8 of 24 positions shown; findings below may reference images not displayed]

ACR Breast Density Category b: There are scattered areas of
fibroglandular density.
FINDINGS: There are no findings suspicious for malignancy. Images were
processed with CAD.
IMPRESSION: No mammographic evidence of malignancy. A result letter of this
screening mammogram will be mailed directly to the patient.

RECOMMENDATION:
Screening mammogram in one year. (Code:[TQ])

BI-RADS CATEGORY  1: Negative.

## 2020-04-08 ENCOUNTER — Other Ambulatory Visit: Payer: Self-pay | Admitting: Family Medicine

## 2020-04-08 DIAGNOSIS — Z1231 Encounter for screening mammogram for malignant neoplasm of breast: Secondary | ICD-10-CM

## 2020-05-01 ENCOUNTER — Ambulatory Visit
Admission: RE | Admit: 2020-05-01 | Discharge: 2020-05-01 | Disposition: A | Discharge status: Home / Self Care | Payer: 59 | Source: Ambulatory Visit | Attending: Family Medicine | Admitting: Family Medicine

## 2020-05-01 ENCOUNTER — Other Ambulatory Visit: Payer: Self-pay

## 2020-05-01 DIAGNOSIS — Z1231 Encounter for screening mammogram for malignant neoplasm of breast: Secondary | ICD-10-CM | POA: Insufficient documentation

## 2020-05-01 IMAGING — MG DIGITAL SCREENING BILAT W/ TOMO W/ CAD
8 series · 8 of 24 positions shown · non-contrast
Comparison: Previous exam(s).

CLINICAL DATA: Screening.

EXAM:
DIGITAL SCREENING BILATERAL MAMMOGRAM WITH TOMO AND CAD

[L MLO synth-2D]
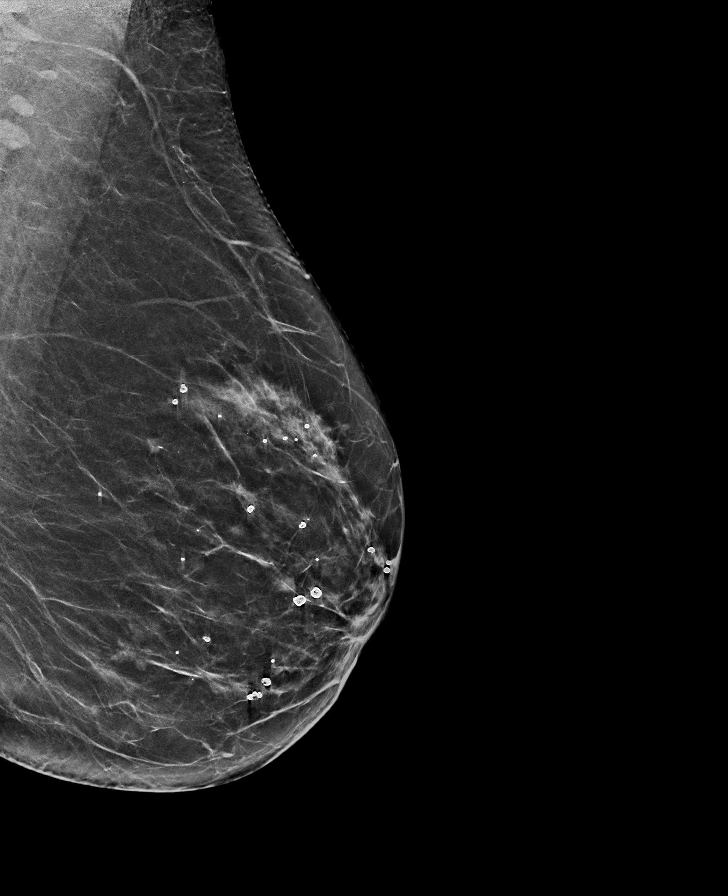

[R MLO synth-2D]
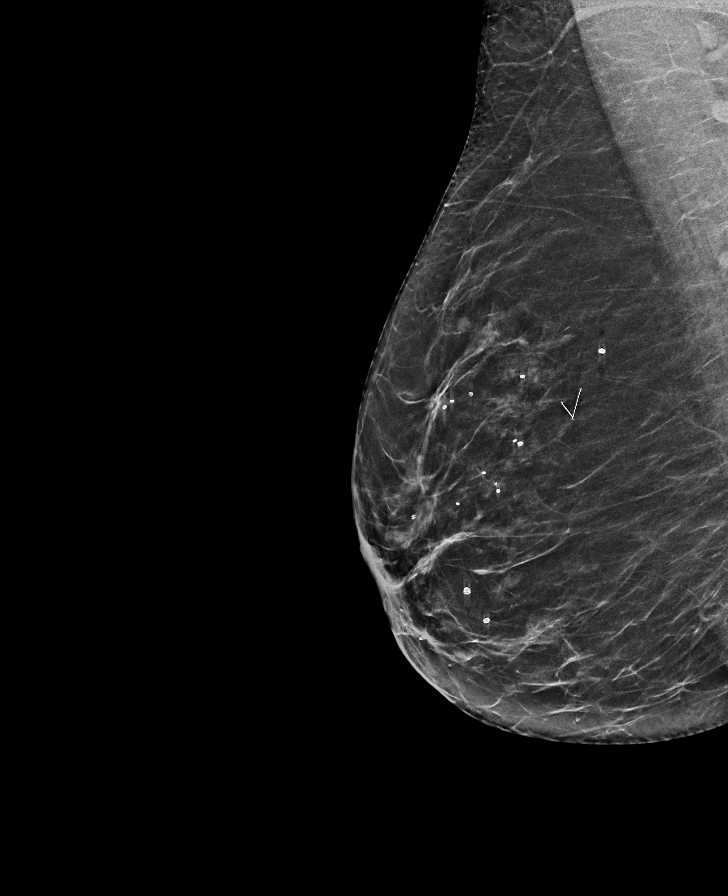

[R CC synth-2D]
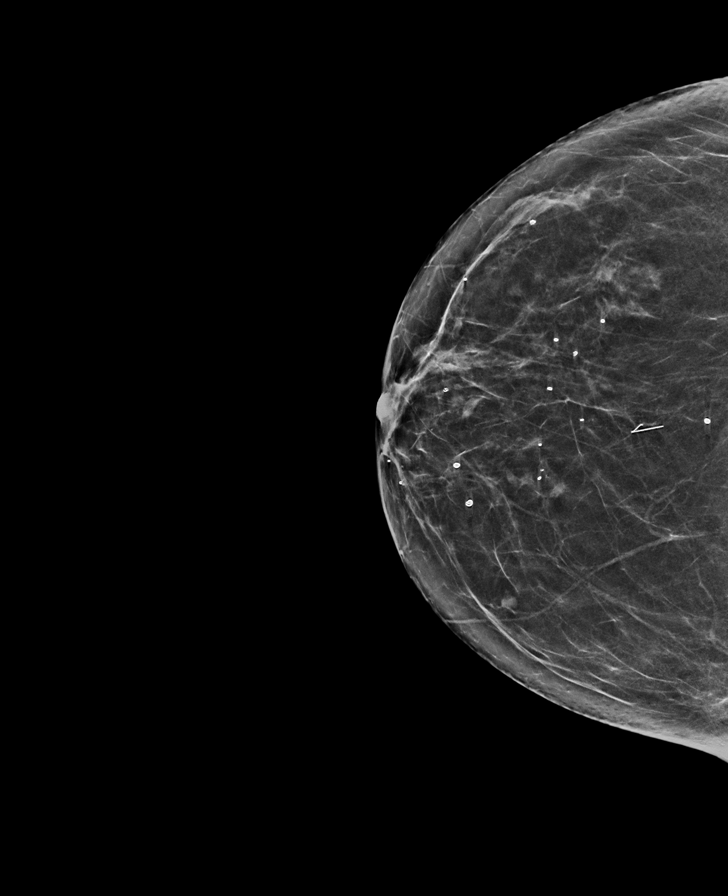

[L CC synth-2D]
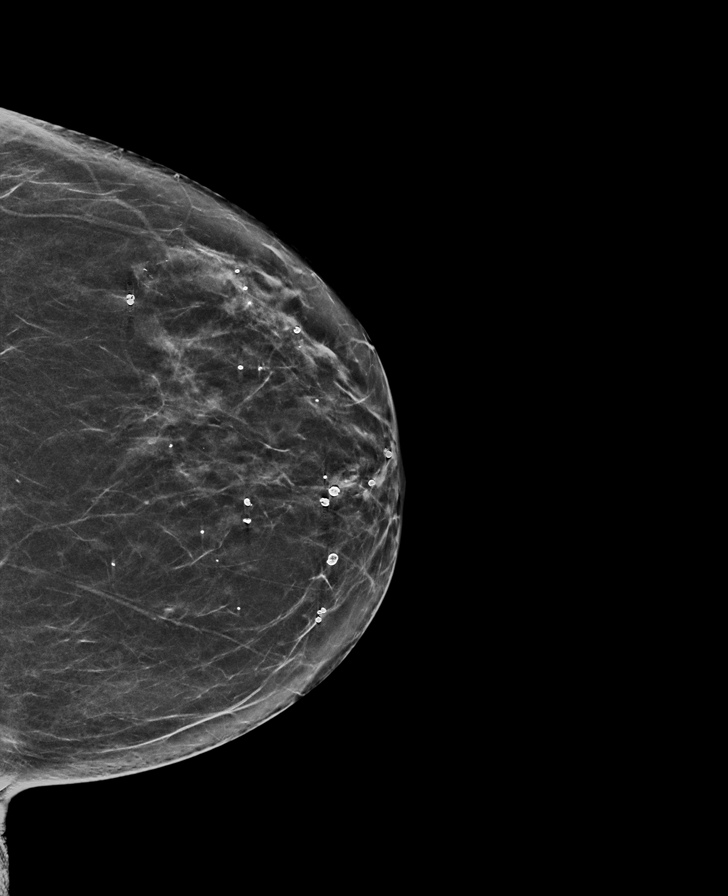

[L MLO tomo · tomo slice 33/65.0]
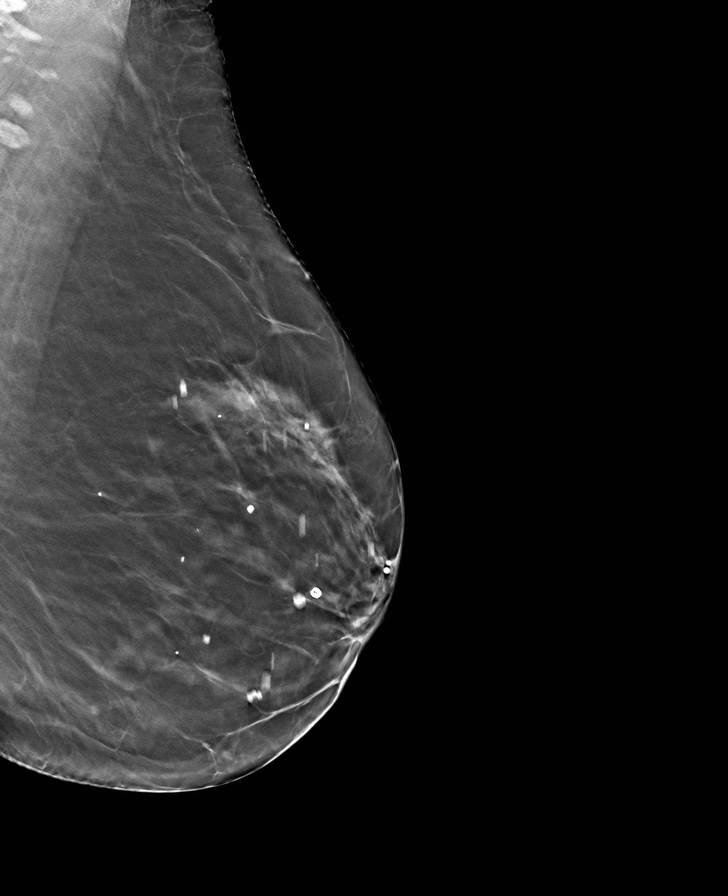

[L CC tomo · tomo slice 31/61.0]
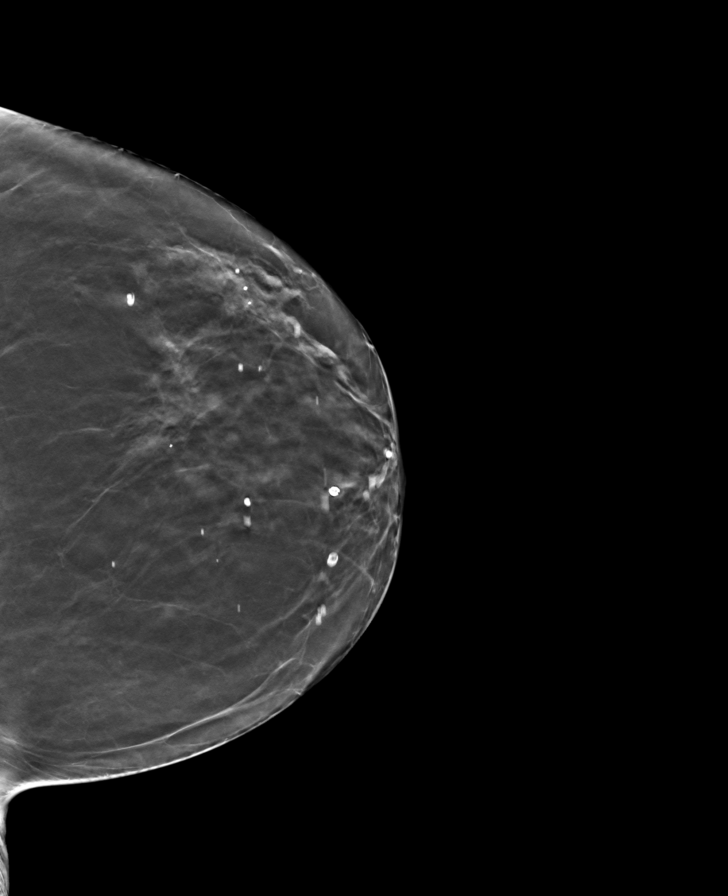

[R CC tomo · tomo slice 31/62.0]
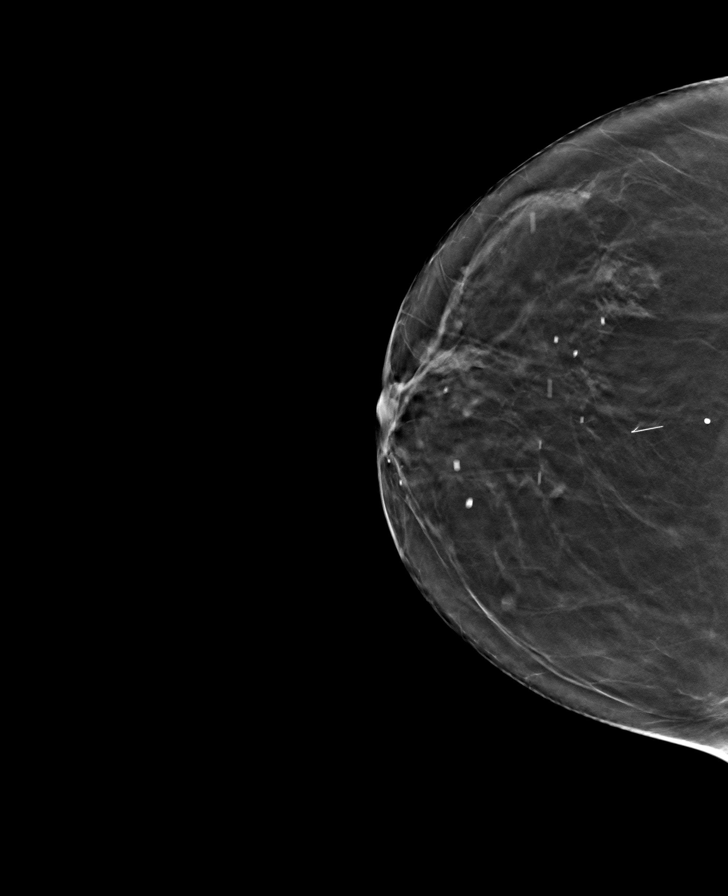

[R MLO tomo · tomo slice 33/65.0]
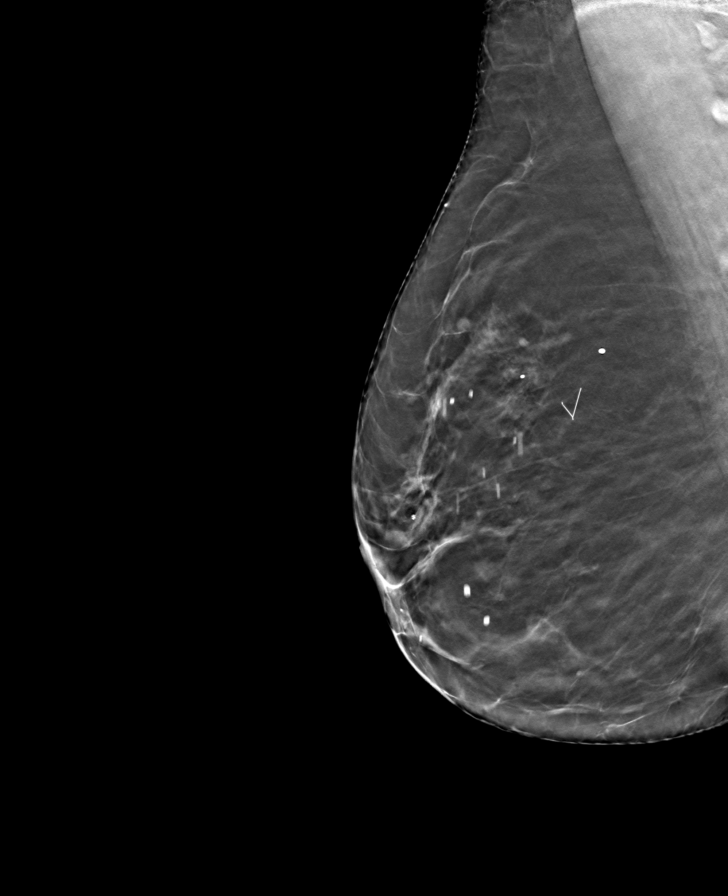

[8 of 24 positions shown; findings below may reference images not displayed]

ACR Breast Density Category b: There are scattered areas of
fibroglandular density.
FINDINGS: In the left breast, a possible mass with adjacent calcifications
warrants further evaluation. In the right breast, no findings
suspicious for malignancy.

Images were processed with CAD.
IMPRESSION: Further evaluation is suggested for a possible mass with adjacent
calcifications in the left breast.

RECOMMENDATION:
Diagnostic mammogram and possibly ultrasound of the left breast.
(Code:[BR])

The patient will be contacted regarding the findings, and additional
imaging will be scheduled.

BI-RADS CATEGORY  0: Incomplete. Need additional imaging evaluation
and/or prior mammograms for comparison.

## 2020-05-03 ENCOUNTER — Other Ambulatory Visit: Payer: Self-pay | Admitting: Family Medicine

## 2020-05-03 DIAGNOSIS — R928 Other abnormal and inconclusive findings on diagnostic imaging of breast: Secondary | ICD-10-CM

## 2020-05-03 DIAGNOSIS — R921 Mammographic calcification found on diagnostic imaging of breast: Secondary | ICD-10-CM

## 2020-05-03 DIAGNOSIS — N6320 Unspecified lump in the left breast, unspecified quadrant: Secondary | ICD-10-CM

## 2020-05-14 ENCOUNTER — Ambulatory Visit
Admission: RE | Admit: 2020-05-14 | Discharge: 2020-05-14 | Disposition: A | Discharge status: Home / Self Care | Payer: 59 | Source: Ambulatory Visit | Attending: Family Medicine | Admitting: Family Medicine

## 2020-05-14 ENCOUNTER — Other Ambulatory Visit: Payer: Self-pay

## 2020-05-14 DIAGNOSIS — N6320 Unspecified lump in the left breast, unspecified quadrant: Secondary | ICD-10-CM | POA: Diagnosis present

## 2020-05-14 DIAGNOSIS — R921 Mammographic calcification found on diagnostic imaging of breast: Secondary | ICD-10-CM

## 2020-05-14 DIAGNOSIS — R928 Other abnormal and inconclusive findings on diagnostic imaging of breast: Secondary | ICD-10-CM

## 2020-05-14 IMAGING — MG MM DIGITAL DIAGNOSTIC UNILAT*L* W/ TOMO W/ CAD
8 series · 8 of 20 positions shown · non-contrast
Comparison: Previous exam(s).

CLINICAL DATA: 64-year-old female for further evaluation of
possible LEFT breast mass and calcifications identified on screening
mammogram.

EXAM:
DIGITAL DIAGNOSTIC LEFT MAMMOGRAM WITH CAD AND TOMOSYNTHESIS
ULTRASOUND LEFT BREAST
TECHNIQUE: Left digital diagnostic mammography and breast tomosynthesis was
performed. Digital images of the breasts were evaluated with
computer-aided detection. Targeted ultrasound examination of the
Left breast was performed.

[L CC]
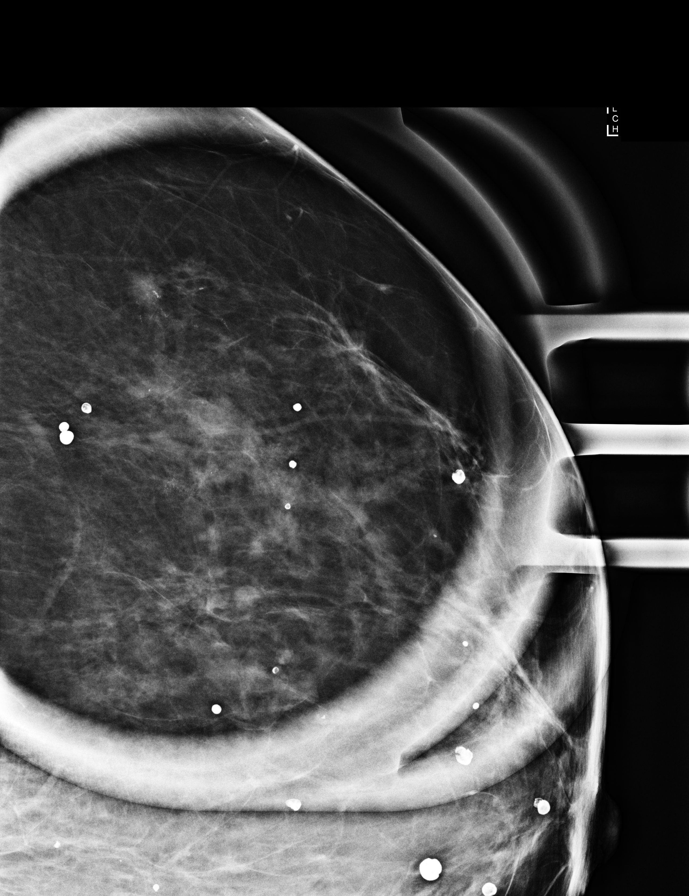

[L ML]
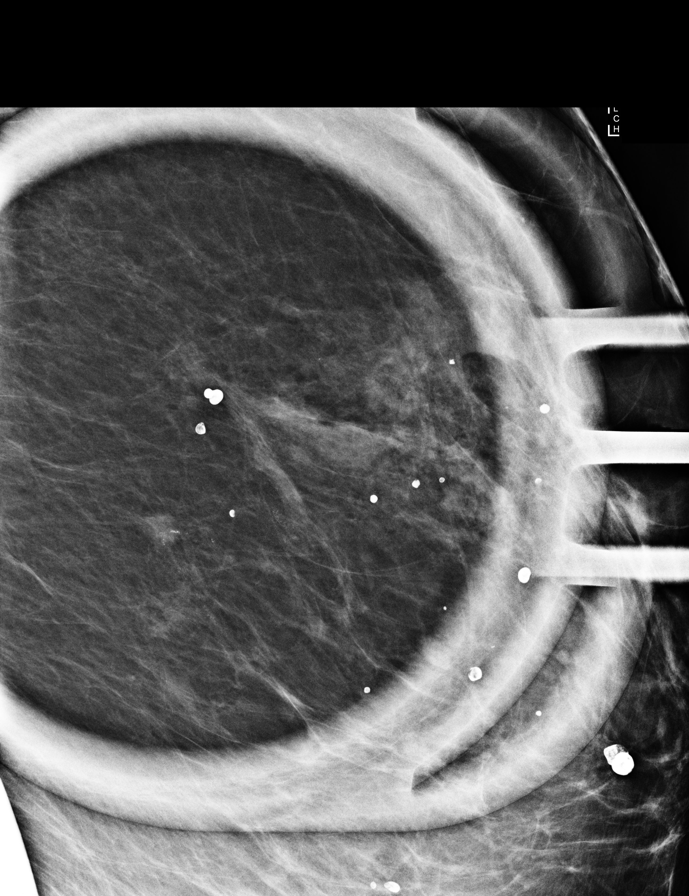

[L CC synth-2D]
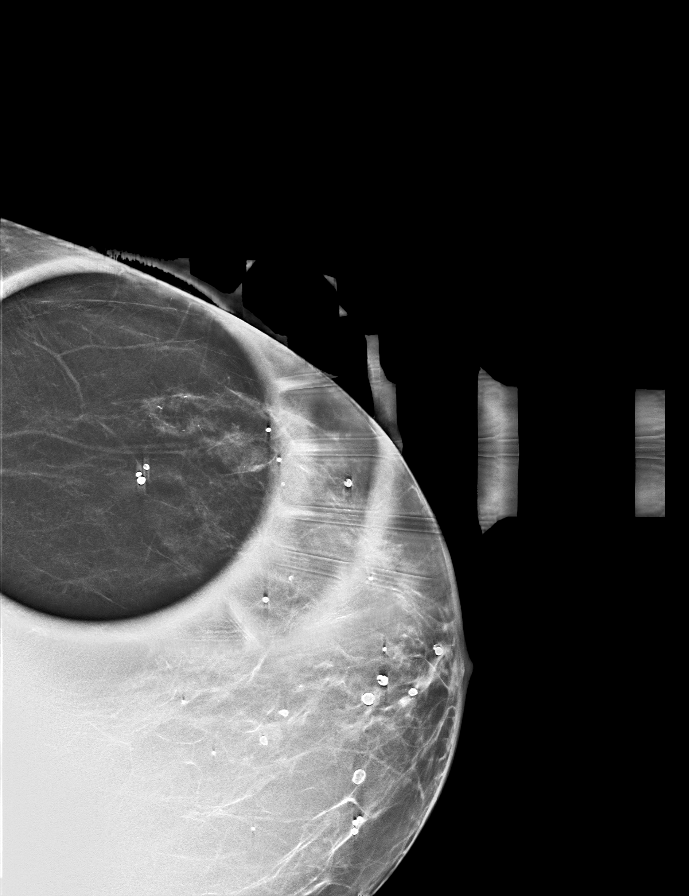

[L ML synth-2D]
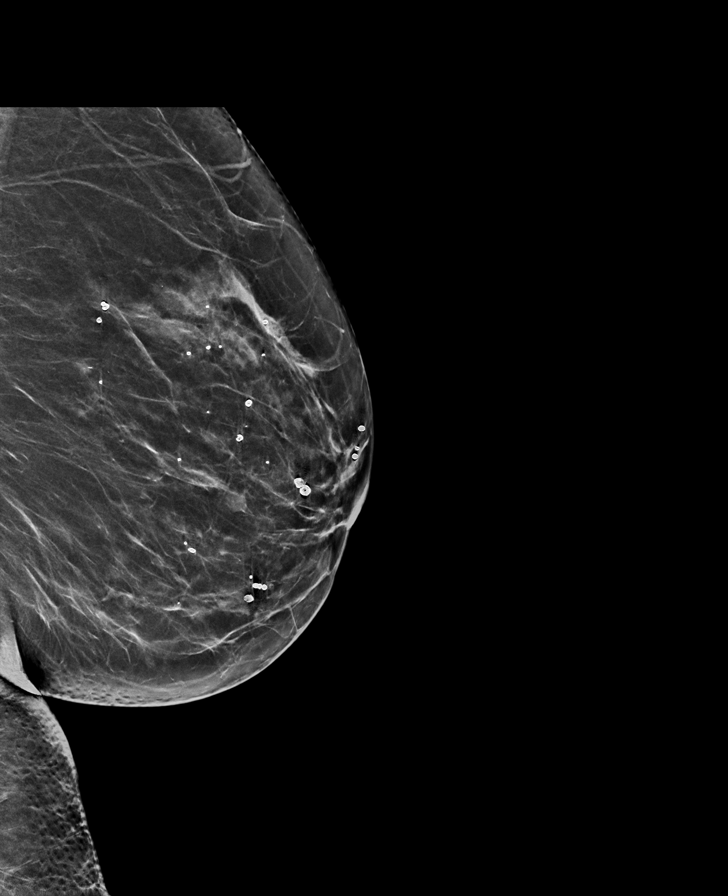

[L MLO synth-2D]
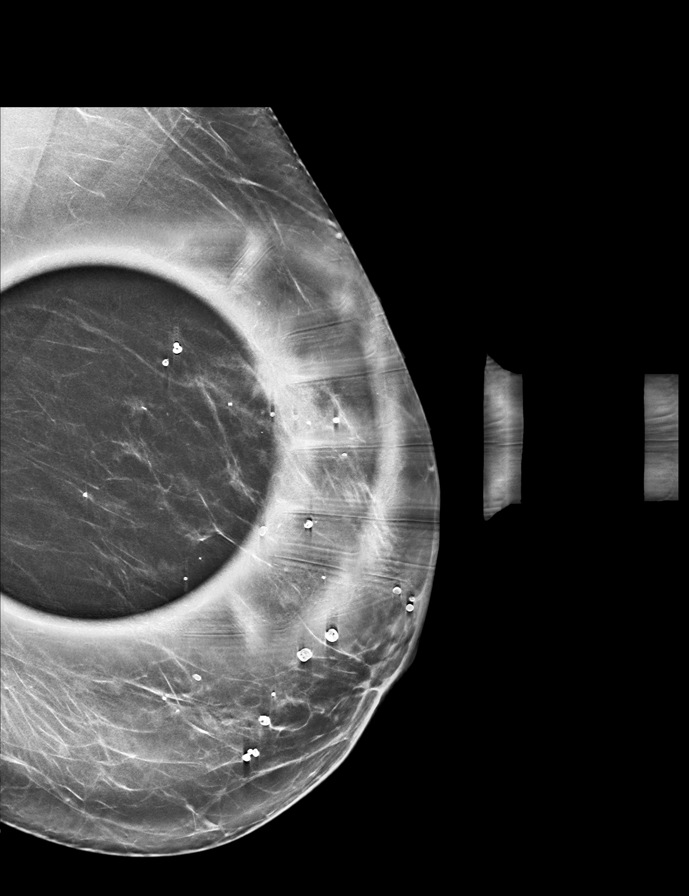

[L ML tomo · tomo slice 34/67.0]
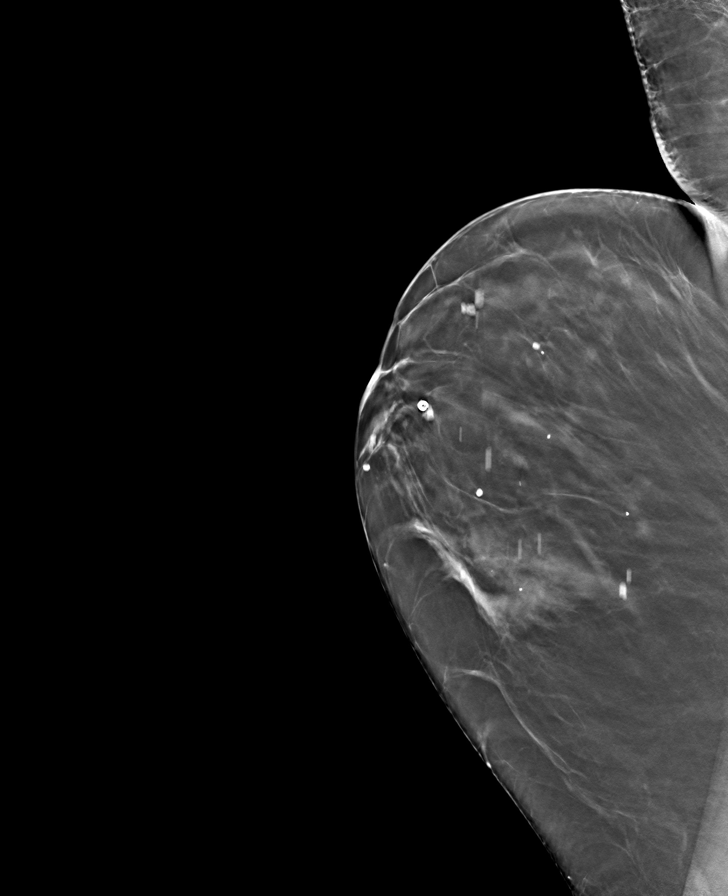

[L CC tomo · tomo slice 27/53.0]
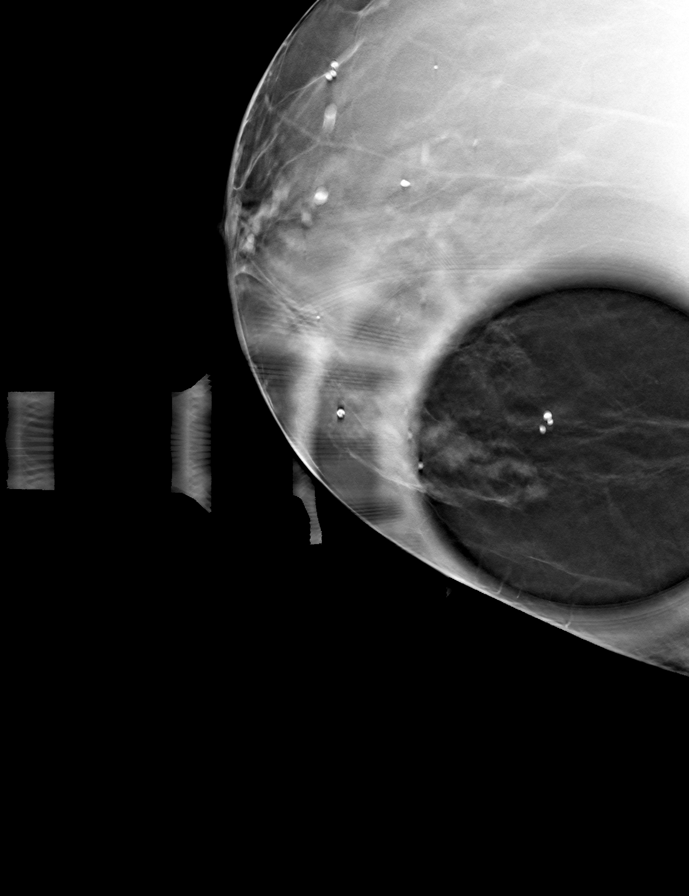

[L MLO tomo · tomo slice 31/61.0]
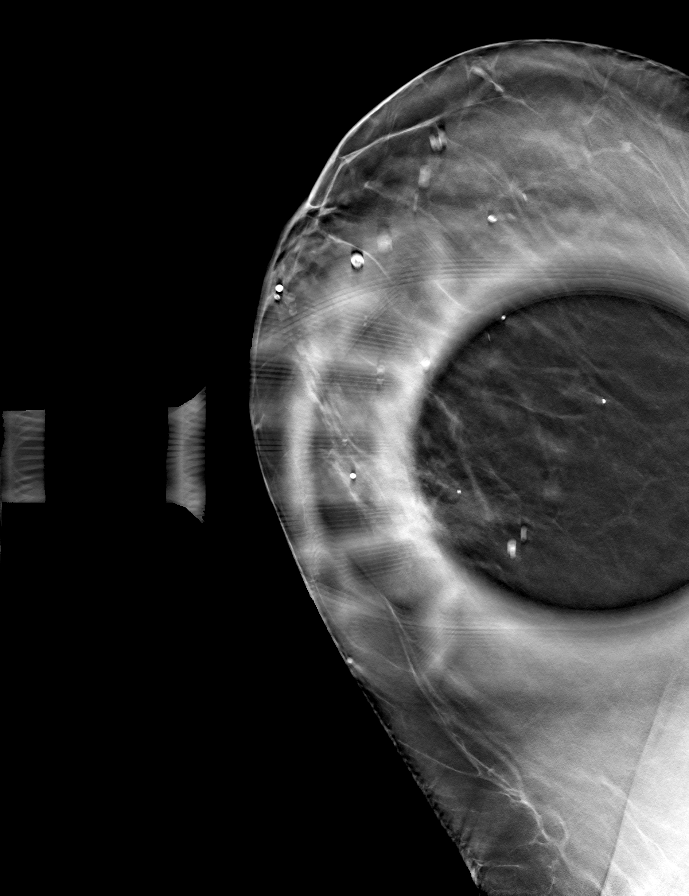

[8 of 20 positions shown; findings below may reference images not displayed]

ACR Breast Density Category b: There are scattered areas of
fibroglandular density.
FINDINGS: Full field, spot compression and magnification views of the LEFT
breast are performed.

A persistent 0.5 cm irregular mass within the UPPER-OUTER LEFT
breast is identified, middle depth. Extending 1.5 cm
anteroinferiorly from this irregular mass are calcifications in a
linear orientation.

An additional 0.4 cm group of primarily punctate calcifications are
also noted within the UPPER-OUTER LEFT breast, located 2.5 cm medial
and 1.5 cm anterior to the irregular mass.

Targeted ultrasound is performed, showing a 0.5 x 0.4 x 0.3 cm
irregular hypoechoic mass at the 2 o'clock position of the LEFT
breast 7 cm from the nipple, corresponding to the mammographic mass.

No abnormal LEFT axillary lymph nodes are identified.
IMPRESSION: 1. 0.5 cm irregular UPPER-OUTER LEFT breast mass with calcifications
extending 1.5 cm anteroinferiorly, suspicious for malignancy.
Ultrasound-guided biopsy of the mass is recommended. Stereotactic
guided biopsy of the anterior/inferior most calcifications extending
from this mass is also recommended.
2. Indeterminate 0.4 cm group of UPPER-OUTER LEFT breast
calcifications, located 2.5 cm from the irregular mass above. Tissue
sampling is recommended.

RECOMMENDATION:
1. Ultrasound-guided biopsy of 0.5 cm UPPER-OUTER LEFT breast mass.
2. Stereotactic guided biopsy of anterior/inferior most
calcifications extending from the 0.5 cm UPPER-OUTER LEFT breast
mass mass.
3. Stereotactic guided biopsy of 0.4 cm separate group of
calcifications within the UPPER-OUTER LEFT breast.

I have discussed the findings and recommendations with the patient.
If applicable, a reminder letter will be sent to the patient
regarding the next appointment.

BI-RADS CATEGORY  4: Suspicious.

## 2020-05-14 IMAGING — US US BREAST*L* LIMITED INC AXILLA
1 series · 11 of 11 positions shown · non-contrast
Comparison: Previous exam(s).

CLINICAL DATA: 64-year-old female for further evaluation of
possible LEFT breast mass and calcifications identified on screening
mammogram.

EXAM:
DIGITAL DIAGNOSTIC LEFT MAMMOGRAM WITH CAD AND TOMOSYNTHESIS
ULTRASOUND LEFT BREAST
TECHNIQUE: Left digital diagnostic mammography and breast tomosynthesis was
performed. Digital images of the breasts were evaluated with
computer-aided detection. Targeted ultrasound examination of the
Left breast was performed.

[Series 1: us breast*left* limited inc axilla · 0.06mm/px · 11 of 11 slices shown]
[im 1/11]
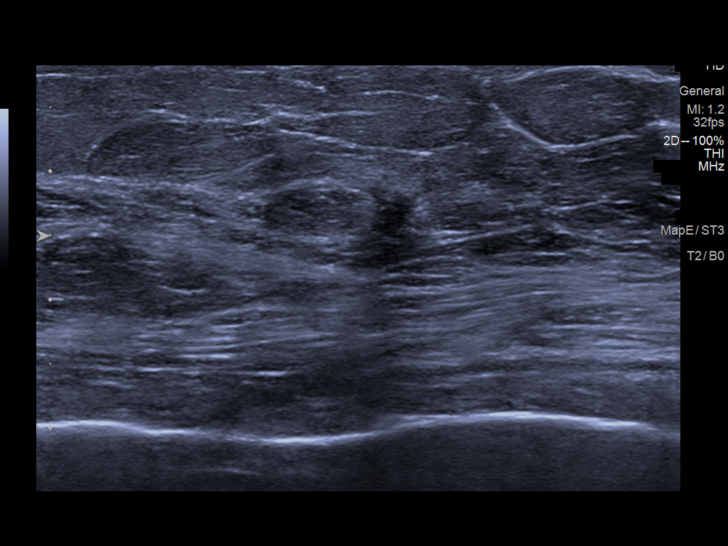
[im 2/11]
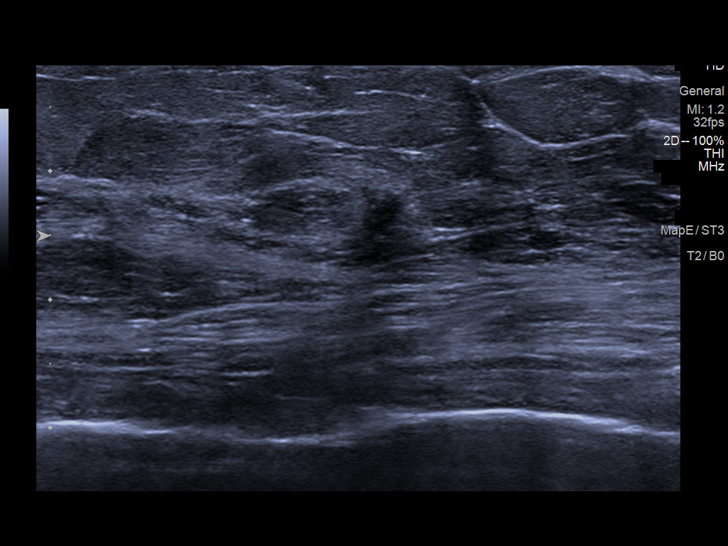
[im 3/11]
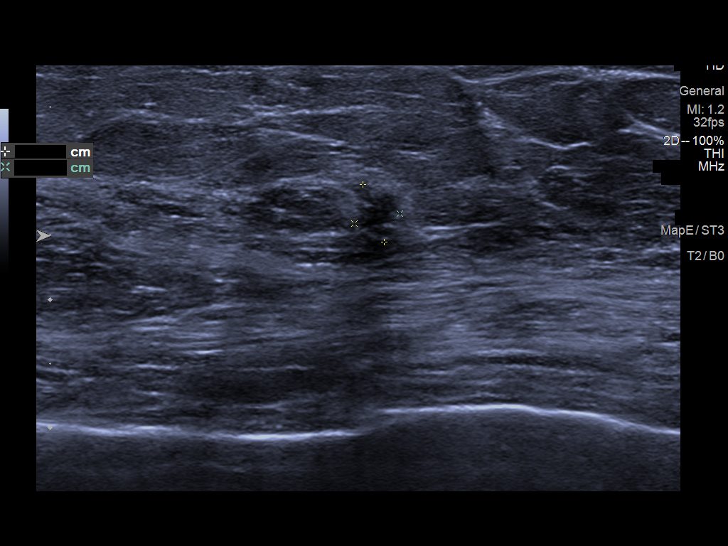
[im 4/11]
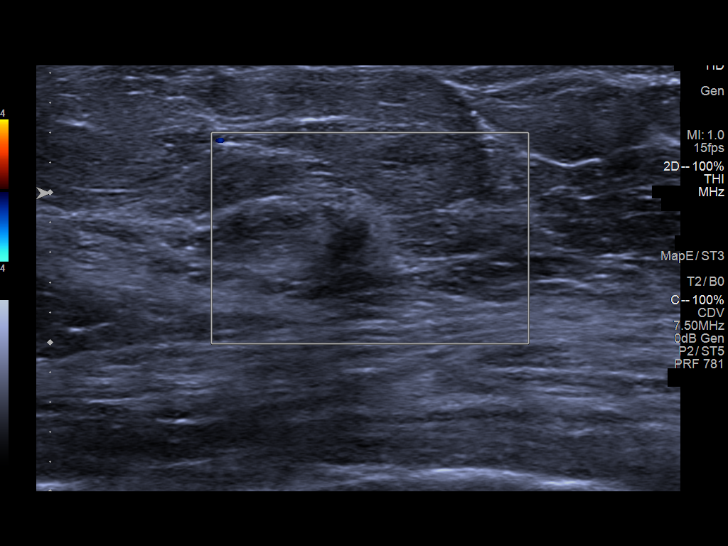
[im 5/11]
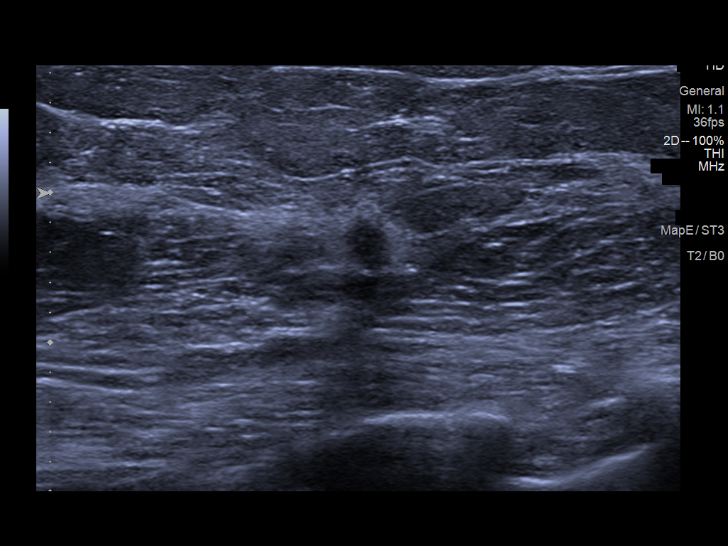
[im 6/11]
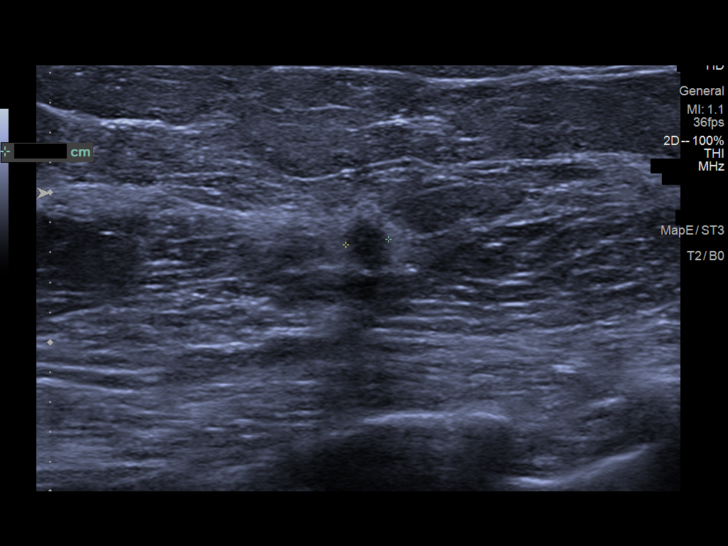
[im 7/11]
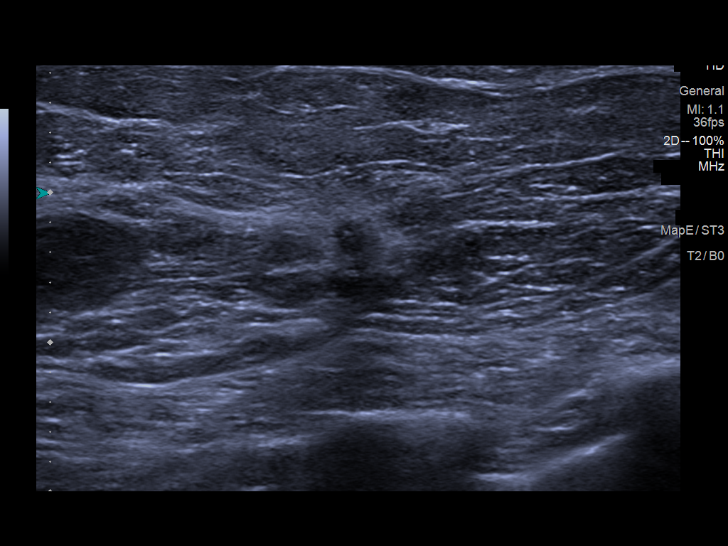
[im 8/11]
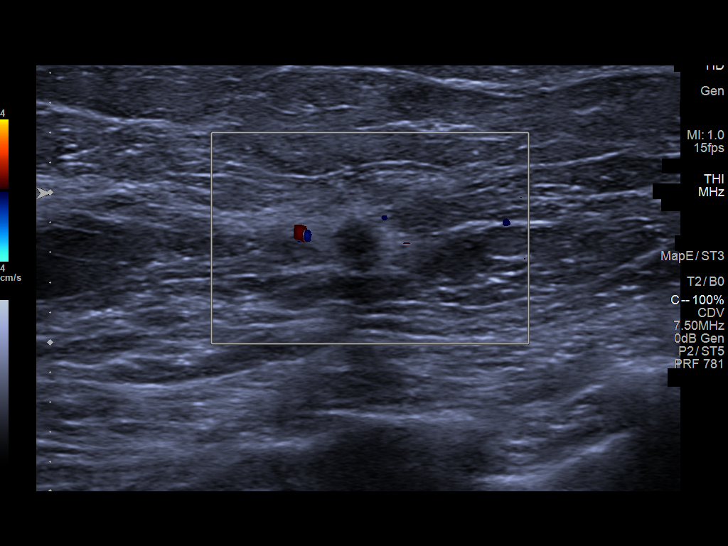
[im 9/11]
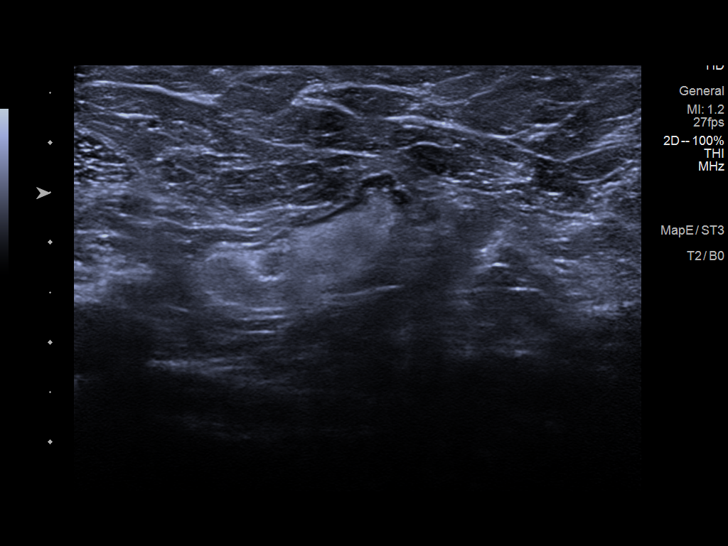
[im 10/11]
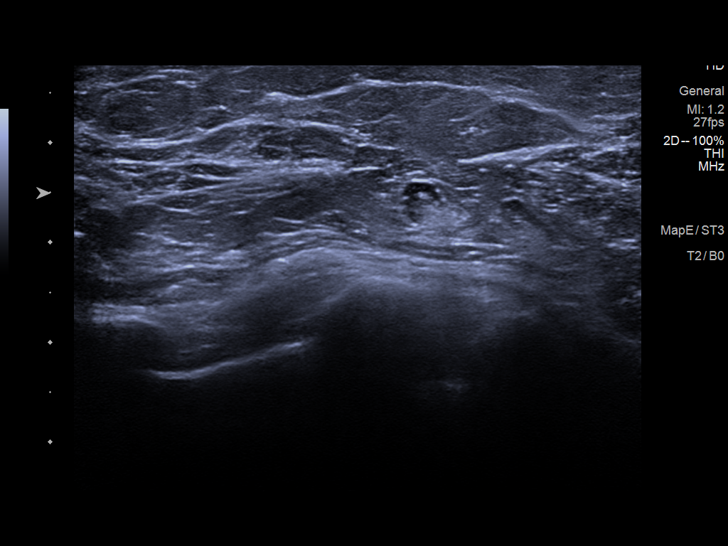
[im 11/11]
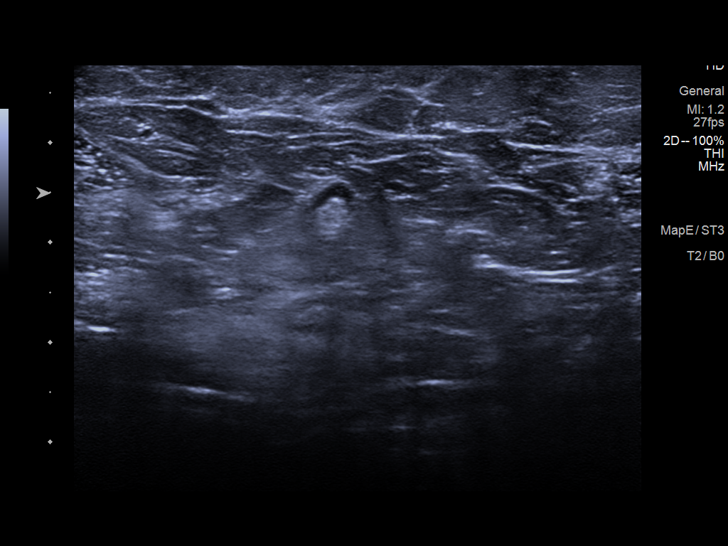

[11 of 11 positions shown; findings below may reference images not displayed]

ACR Breast Density Category b: There are scattered areas of
fibroglandular density.
FINDINGS: Full field, spot compression and magnification views of the LEFT
breast are performed.

A persistent 0.5 cm irregular mass within the UPPER-OUTER LEFT
breast is identified, middle depth. Extending 1.5 cm
anteroinferiorly from this irregular mass are calcifications in a
linear orientation.

An additional 0.4 cm group of primarily punctate calcifications are
also noted within the UPPER-OUTER LEFT breast, located 2.5 cm medial
and 1.5 cm anterior to the irregular mass.

Targeted ultrasound is performed, showing a 0.5 x 0.4 x 0.3 cm
irregular hypoechoic mass at the 2 o'clock position of the LEFT
breast 7 cm from the nipple, corresponding to the mammographic mass.

No abnormal LEFT axillary lymph nodes are identified.
IMPRESSION: 1. 0.5 cm irregular UPPER-OUTER LEFT breast mass with calcifications
extending 1.5 cm anteroinferiorly, suspicious for malignancy.
Ultrasound-guided biopsy of the mass is recommended. Stereotactic
guided biopsy of the anterior/inferior most calcifications extending
from this mass is also recommended.
2. Indeterminate 0.4 cm group of UPPER-OUTER LEFT breast
calcifications, located 2.5 cm from the irregular mass above. Tissue
sampling is recommended.

RECOMMENDATION:
1. Ultrasound-guided biopsy of 0.5 cm UPPER-OUTER LEFT breast mass.
2. Stereotactic guided biopsy of anterior/inferior most
calcifications extending from the 0.5 cm UPPER-OUTER LEFT breast
mass mass.
3. Stereotactic guided biopsy of 0.4 cm separate group of
calcifications within the UPPER-OUTER LEFT breast.

I have discussed the findings and recommendations with the patient.
If applicable, a reminder letter will be sent to the patient
regarding the next appointment.

BI-RADS CATEGORY  4: Suspicious.

## 2020-05-17 ENCOUNTER — Other Ambulatory Visit: Payer: Self-pay | Admitting: Family Medicine

## 2020-05-17 DIAGNOSIS — R921 Mammographic calcification found on diagnostic imaging of breast: Secondary | ICD-10-CM

## 2020-05-17 DIAGNOSIS — R928 Other abnormal and inconclusive findings on diagnostic imaging of breast: Secondary | ICD-10-CM

## 2020-05-17 DIAGNOSIS — N6320 Unspecified lump in the left breast, unspecified quadrant: Secondary | ICD-10-CM

## 2020-05-29 ENCOUNTER — Ambulatory Visit
Admission: RE | Admit: 2020-05-29 | Discharge: 2020-05-29 | Disposition: A | Discharge status: Home / Self Care | Payer: 59 | Source: Ambulatory Visit | Attending: Family Medicine | Admitting: Family Medicine

## 2020-05-29 ENCOUNTER — Other Ambulatory Visit: Payer: Self-pay

## 2020-05-29 DIAGNOSIS — N6320 Unspecified lump in the left breast, unspecified quadrant: Secondary | ICD-10-CM | POA: Insufficient documentation

## 2020-05-29 DIAGNOSIS — R921 Mammographic calcification found on diagnostic imaging of breast: Secondary | ICD-10-CM | POA: Insufficient documentation

## 2020-05-29 DIAGNOSIS — R928 Other abnormal and inconclusive findings on diagnostic imaging of breast: Secondary | ICD-10-CM

## 2020-05-29 HISTORY — PX: BREAST BIOPSY: SHX20

## 2020-05-29 IMAGING — MG MM BREAST LOCALIZATION CLIP
4 series · 4 of 12 positions shown · non-contrast
Comparison: Previous exam(s).

CLINICAL DATA: Status post ultrasound-guided biopsy of a mass in
the left breast and two-site stereotactic guided biopsy of
calcifications in the left breast.

EXAM:
3D DIAGNOSTIC LEFT MAMMOGRAM POST ULTRASOUND AND STEREOTACTIC GUIDED
BIOPSIES

[L ML synth-2D]
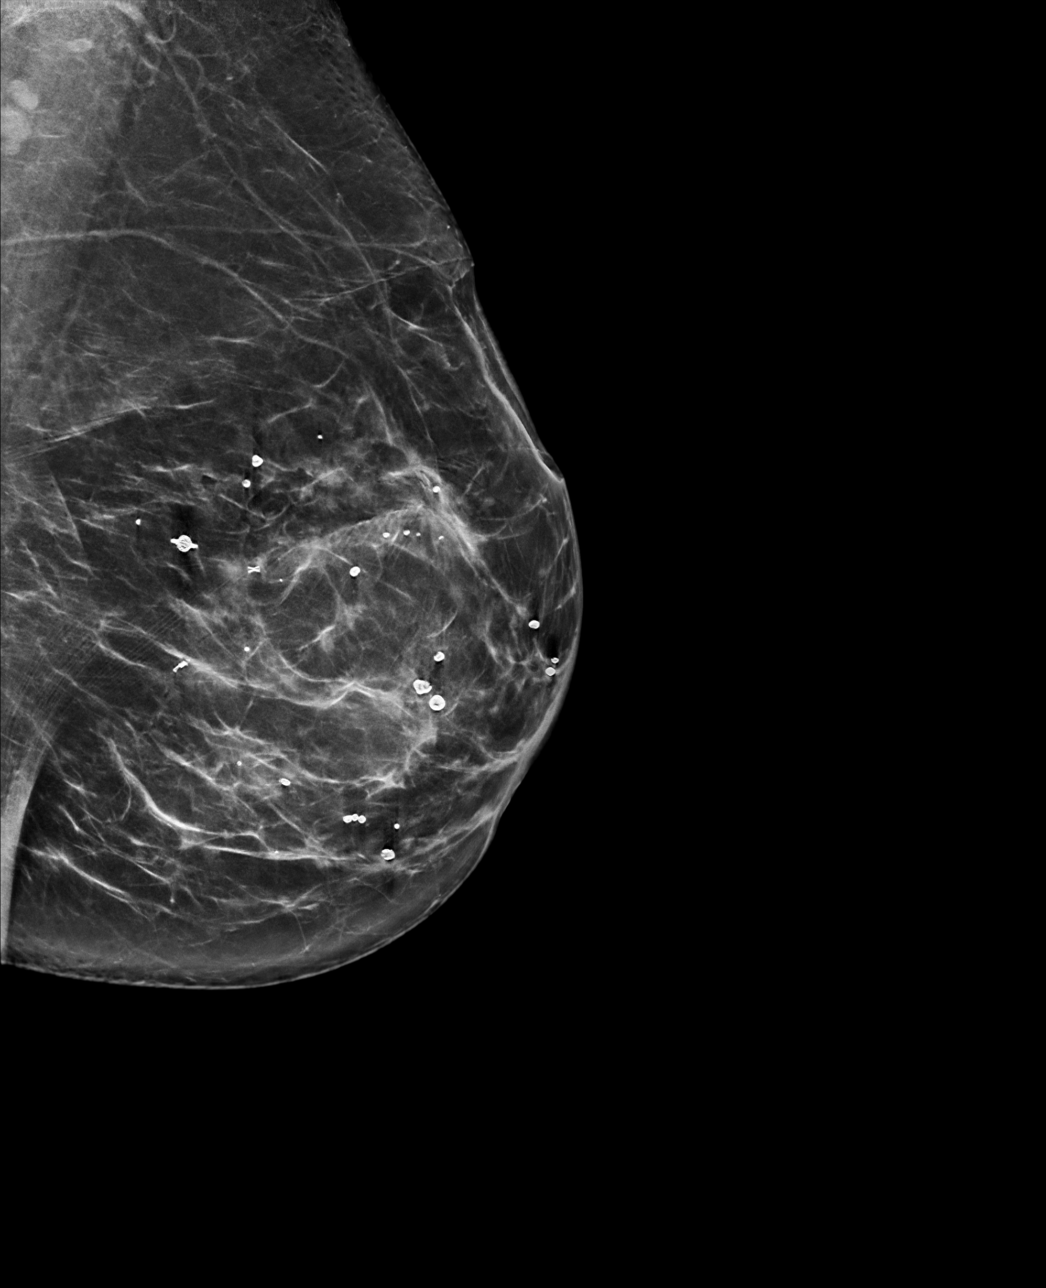

[L CC synth-2D]
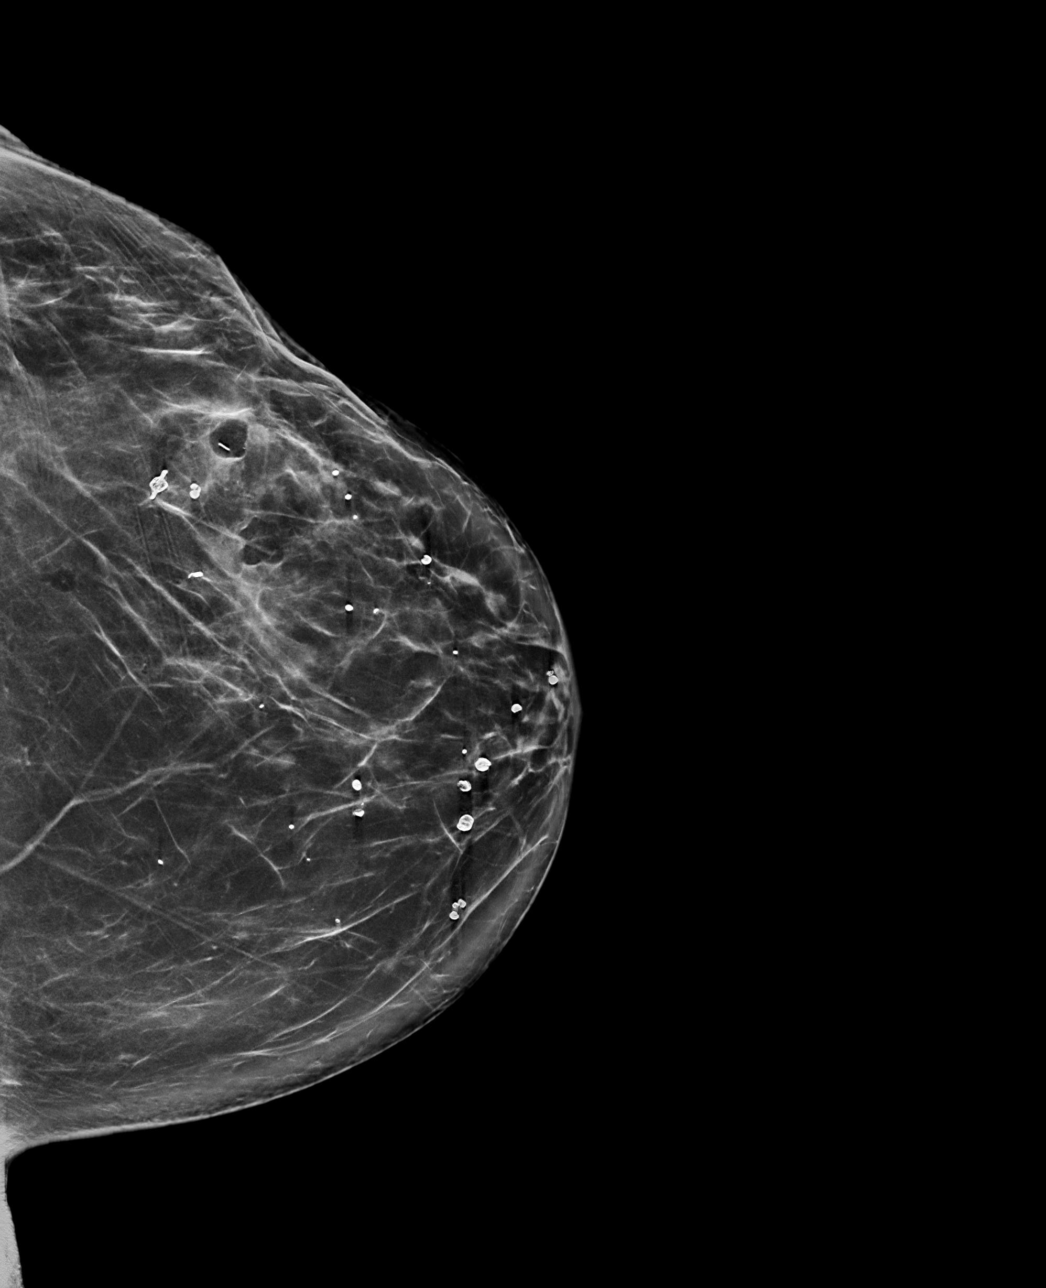

[L ML tomo · tomo slice 38/75.0]
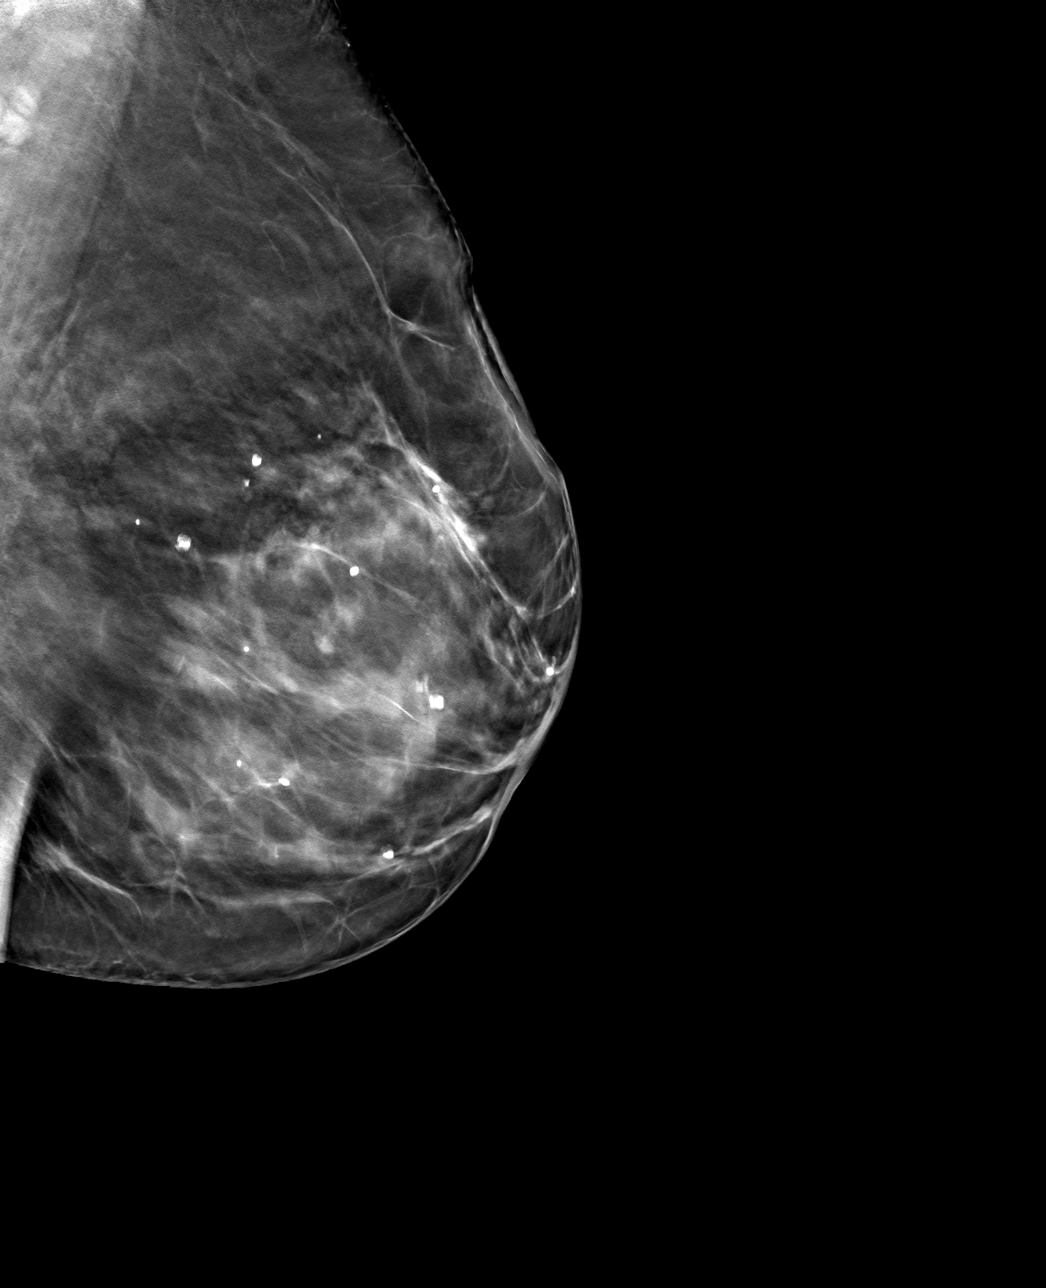

[L CC tomo · tomo slice 41/80.0]
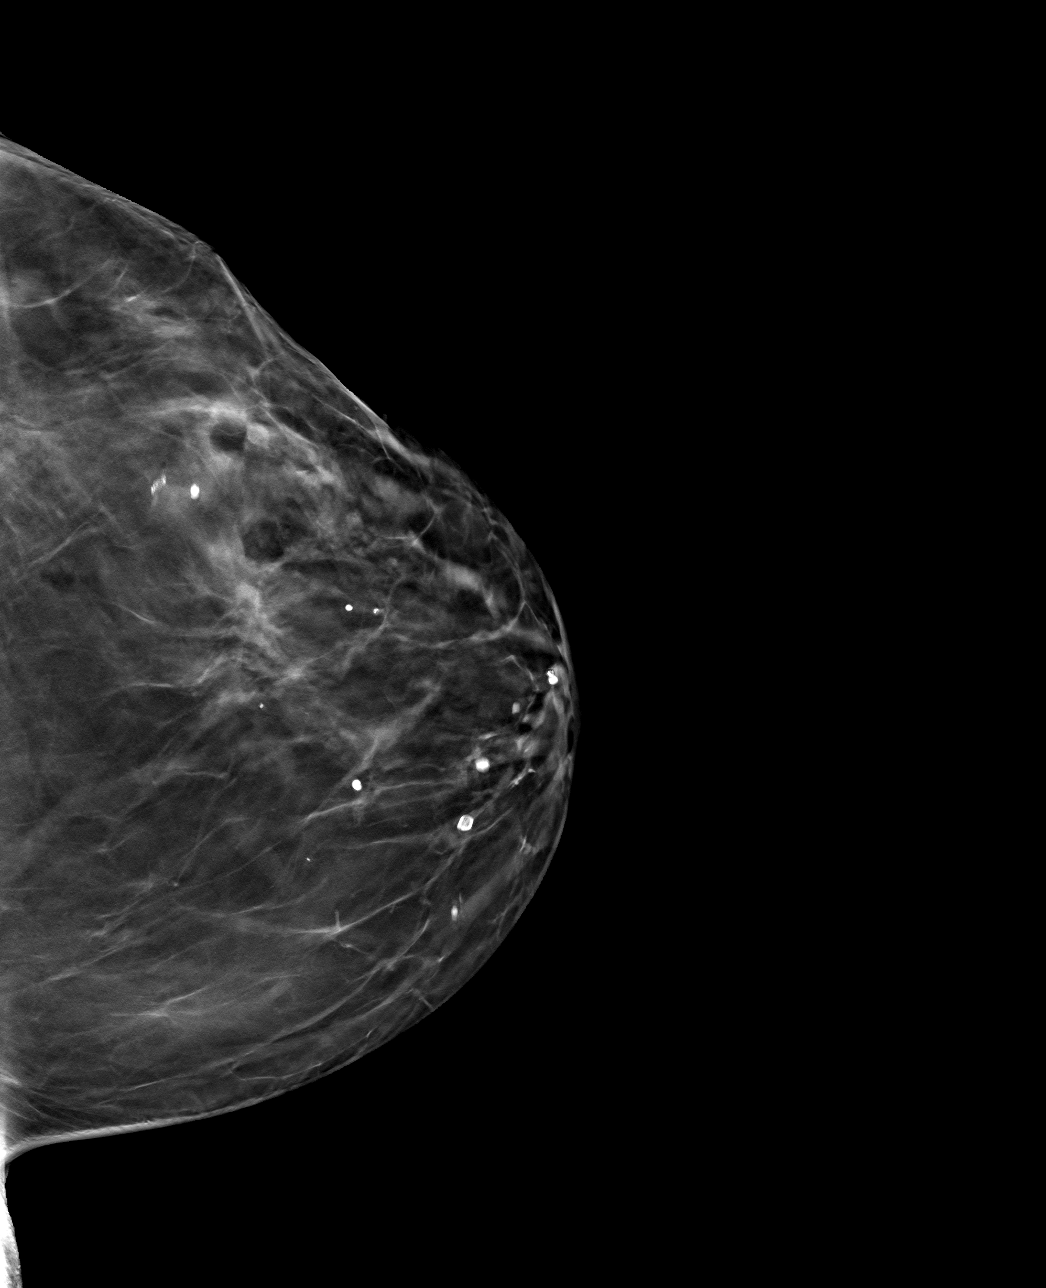

[4 of 12 positions shown; findings below may reference images not displayed]

FINDINGS: 3D Mammographic images were obtained following ultrasound guided
biopsy of a mass in the left breast at 2 o'clock 7 cm from the
nipple and stereotactic guided two-site biopsy of two groups of
microcalcifications in the upper-outer quadrant of the left breast.
The biopsy marking clips are in expected position at the sites of
biopsy.
IMPRESSION: Appropriate positioning of the Vision, X, and coil shaped biopsy
marking clips at the sites of biopsy in the left breast.

Final Assessment: Post Procedure Mammograms for Marker Placement

## 2020-05-29 IMAGING — MG MM BREAST BX W LOC DEV 1ST LESION IMAGE BX SPEC STEREO GUIDE*L*
6 of 13 series · 6 of 25 positions shown · non-contrast
Comparison: Previous exam(s).
COMPARISON: Previous exam(s).

Addendum:
CLINICAL DATA: Here for ultrasound-guided biopsy of an irregular
mass in the left breast as well as two-site stereotactic guided
biopsy of calcifications in the left breast.

EXAM:
ULTRASOUND GUIDED LEFT BREAST CORE NEEDLE BIOPSY
LEFT BREAST STEREOTACTIC CORE NEEDLE BIOPSY, TWO SITES

[L (1 of 5)]
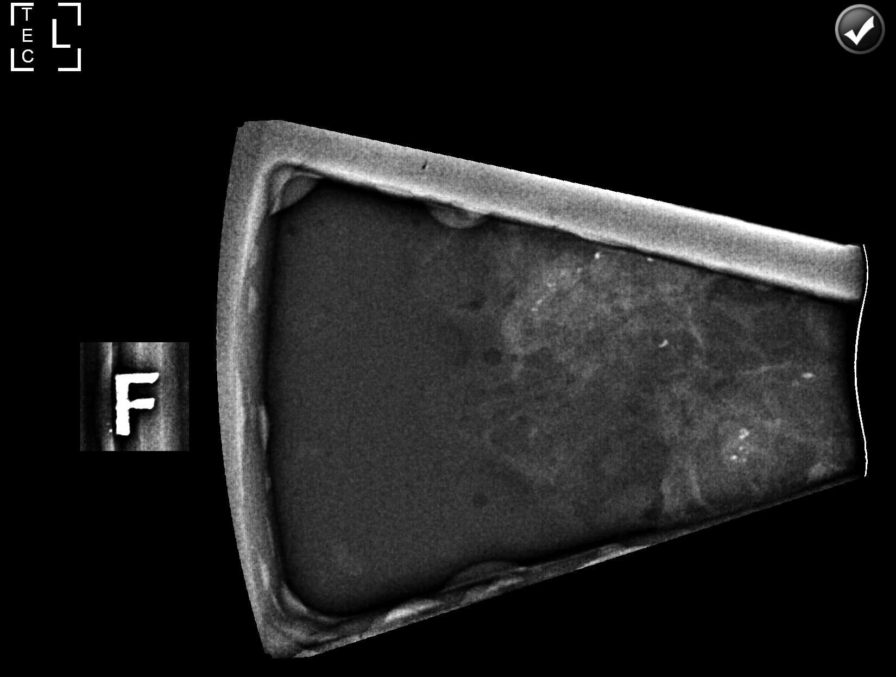

[L (2 of 5)]
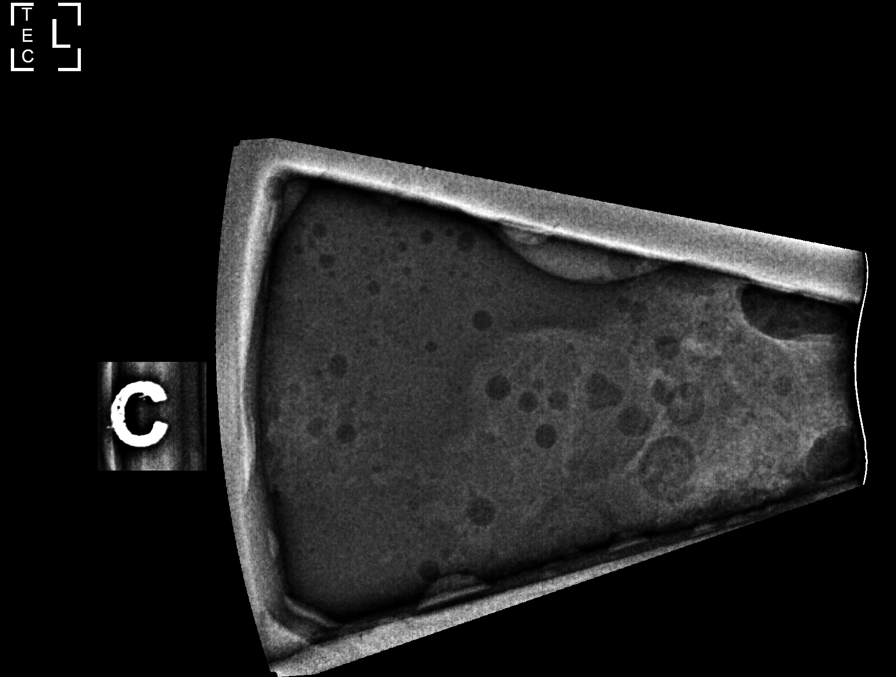

[L (3 of 5)]
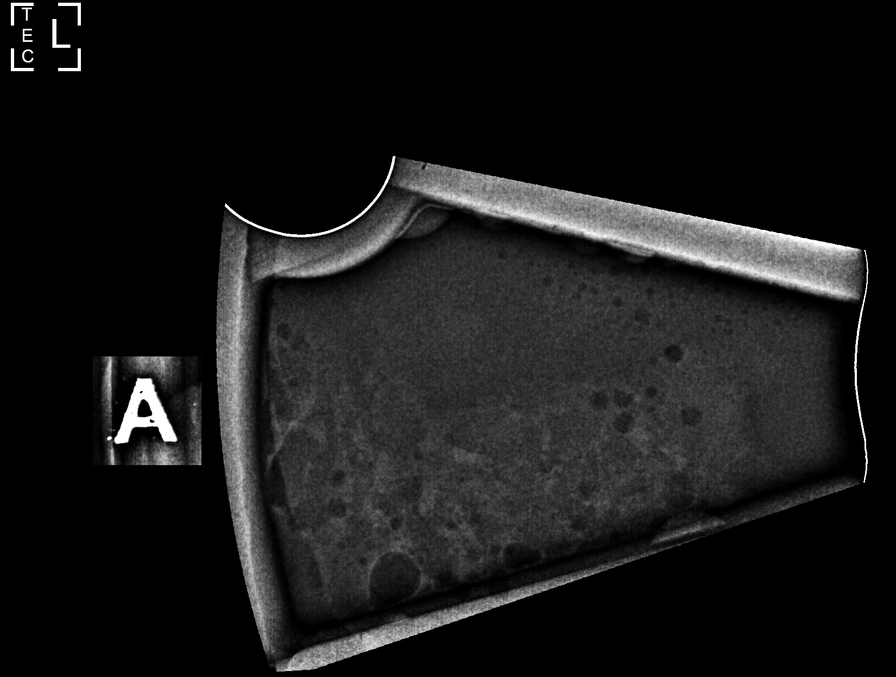

[L (4 of 5)]
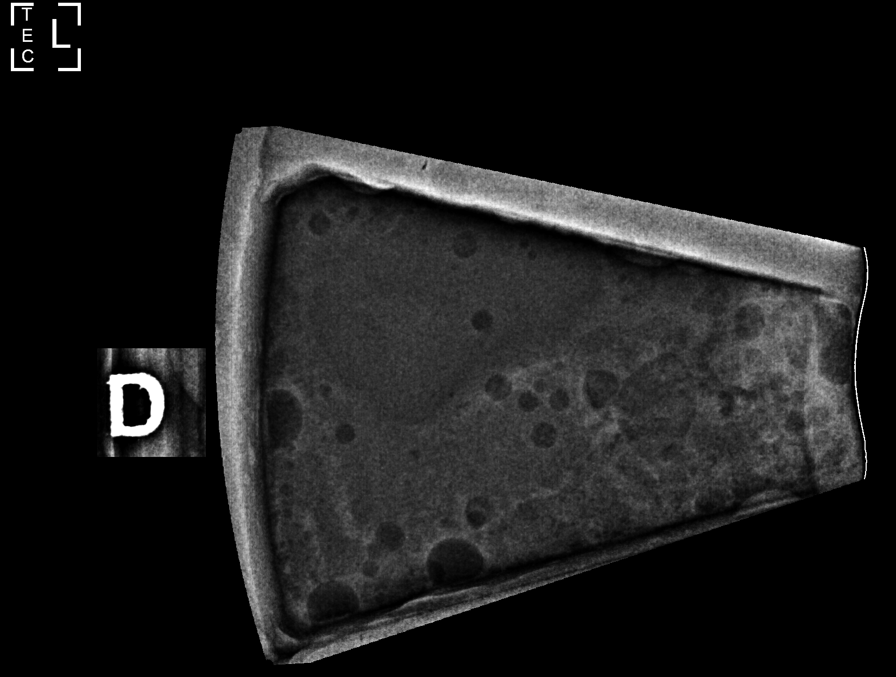

[L (5 of 5)]
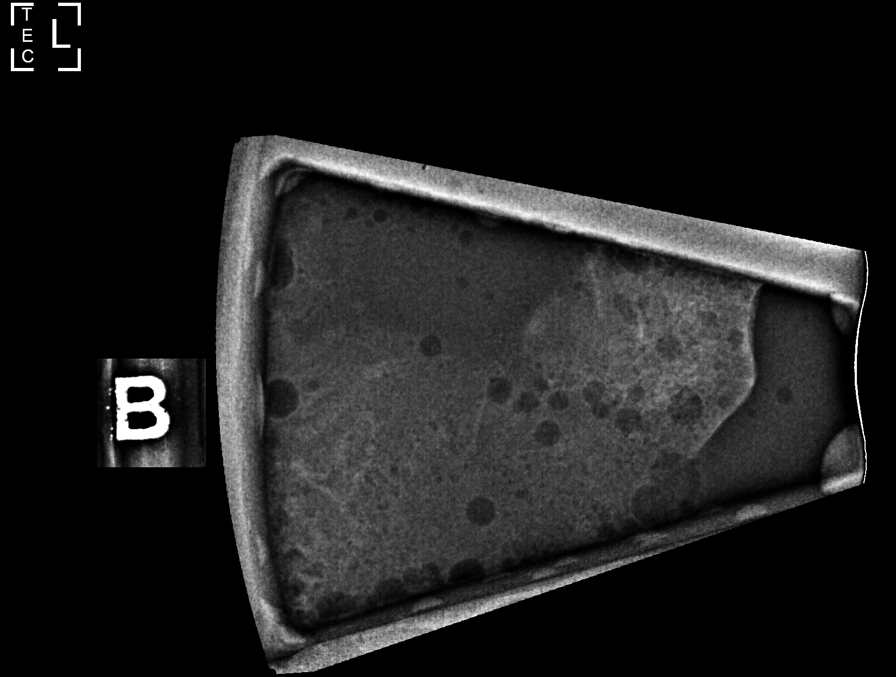

[L CC]
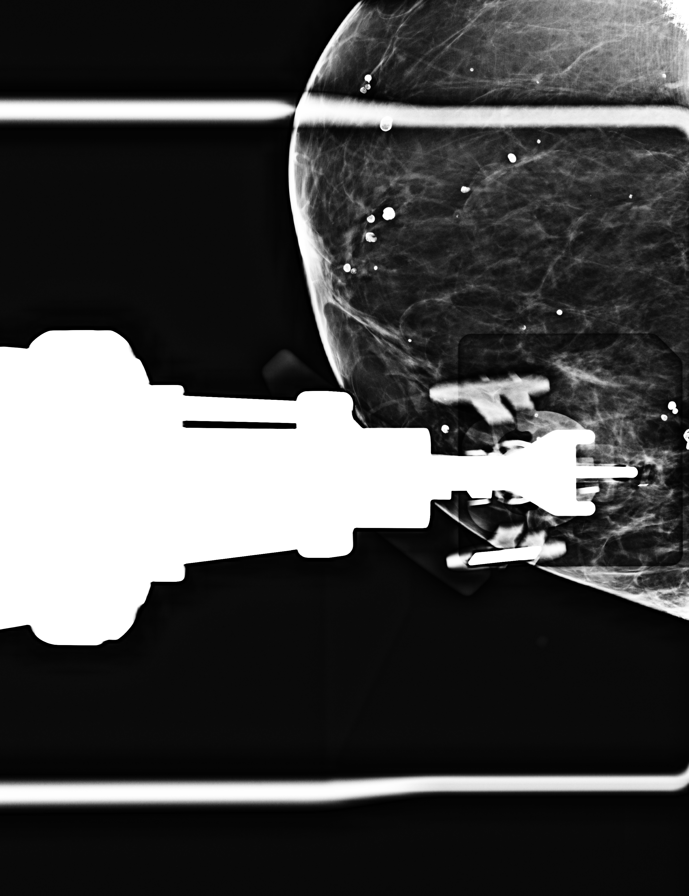

[6 of 25 positions shown; findings below may reference images not displayed]

PROCEDURE:
I met with the patient and we discussed the procedure of
image-guided biopsy, including benefits and alternatives. We
discussed the high likelihood of a successful procedure. We
discussed the risks of the procedure, including infection, bleeding,
tissue injury, clip migration, and inadequate sampling. Informed
written consent was given. The usual time-out protocol was performed
immediately prior to the procedure.

Site 1 (US Biopsy)-

Lesion quadrant: Upper outer quadrant

Using sterile technique and 1% Lidocaine as local anesthetic, under
direct ultrasound visualization, a 12 gauge SOMMERS device was
used to perform biopsy of an irregular hypoechoic mass at 2 o'clock
7 cm from the nipple using a lateral approach. At the conclusion of
the procedure a Vision shaped tissue marker clip was deployed into
the biopsy cavity. Follow up 2 view mammogram was performed and
dictated separately.

Site 2 (Stereotactic Biopsy)-

Using sterile technique and 1% Lidocaine as local anesthetic, under
stereotactic guidance, a 9 gauge vacuum assisted device was used to
perform core needle biopsy of calcifications extending
anteroinferiorly from the irregular mass which are located in the
upper outer quadrant (more laterally) using a superior approach.
Specimen radiograph was performed showing sampling of representative
tissue. Specimens with calcifications are identified for pathology.

Lesion quadrant: Upper outer quadrant

At the conclusion of the procedure, an X shaped tissue marker clip
was deployed into the biopsy cavity. Follow-up 2-view mammogram was
performed and dictated separately.

Site 3 (Stereotactic Biopsy)-

Using sterile technique and 1% Lidocaine as local anesthetic, under
stereotactic guidance, a 9 gauge vacuum assisted device was used to
perform core needle biopsy of grouped microcalcifications located in
the upper outer quadrant (more medially) using a superior approach.
Specimen radiograph was performed showing sampling of representative
tissue. Specimens with calcifications are identified for pathology.

Lesion quadrant: Upper outer quadrant

At the conclusion of the procedure, a coil shaped tissue marker clip
was deployed into the biopsy cavity. Follow-up 2-view mammogram was
performed and dictated separately.
IMPRESSION: Ultrasound-guided biopsy of an irregular mass in the left breast and
stereotactic-guided two-site biopsy of calcifications in the left
breast. No apparent complications.

ADDENDUM:
Pathology revealed GRADE III INVASIVE MAMMARY CARCINOMA, NO SPECIAL
TYPE, HIGH GRADE DUCTAL CARCINOMA IN SITU WITH CALCIFICATIONS of the
LEFT BREAST, 2 o'clock, [BZ], vision clip. This was found to be
concordant by Dr. SOMMERS.

Pathology revealed GRADE III INVASIVE MAMMARY CARCINOMA, NO SPECIAL
TYPE, EXTENSIVE HIGH-GRADE DUCTAL CARCINOMA IN SITU (DCIS) WITH
COMEDONECROSIS, CALCIFICATIONS ASSOCIATED WITH DCIS AND SCLEROSING
ADENOSIS, AREAS SUSPICIOUS FOR LYMPHOVASCULAR INVASION of the LEFT
breast, lateral upper outer quadrant, X clip. This was found to be
concordant by Dr. SOMMERS.

Pathology revealed COLUMNAR CELL CHANGE WITH ASSOCIATED
CALCIFICATIONS, NEGATIVE FOR ATYPIA AND MALIGNANCY of the LEFT
breast calcifications, medial upper outer quadrant, coil clip. This
was found to be concordant by Dr. SOMMERS.

Of note, the area of invasive mammary carcinoma spans at least
cm mammographically.

Pathology results were discussed with the patient by telephone. The
patient reported doing well after the biopsies with tenderness at
the sites. Post biopsy instructions and care were reviewed and
questions were answered. The patient was encouraged to call [HOSPITAL]
[facility] for any
additional concerns.

Surgical and Medical Oncologist referral will be made by SOMM[facility]
RN Breast Oncology Navigator of [facility]]. Reports sent via [REDACTED] message to
Oncology Navigator.

Pathology results reported by SOMMERS RN on [DATE].

*** End of Addendum ***
PROCEDURE:
I met with the patient and we discussed the procedure of
image-guided biopsy, including benefits and alternatives. We
discussed the high likelihood of a successful procedure. We
discussed the risks of the procedure, including infection, bleeding,
tissue injury, clip migration, and inadequate sampling. Informed
written consent was given. The usual time-out protocol was performed
immediately prior to the procedure.

Site 1 (US Biopsy)-

Lesion quadrant: Upper outer quadrant

Using sterile technique and 1% Lidocaine as local anesthetic, under
direct ultrasound visualization, a 12 gauge SOMMERS device was
used to perform biopsy of an irregular hypoechoic mass at 2 o'clock
7 cm from the nipple using a lateral approach. At the conclusion of
the procedure a Vision shaped tissue marker clip was deployed into
the biopsy cavity. Follow up 2 view mammogram was performed and
dictated separately.

Site 2 (Stereotactic Biopsy)-

Using sterile technique and 1% Lidocaine as local anesthetic, under
stereotactic guidance, a 9 gauge vacuum assisted device was used to
perform core needle biopsy of calcifications extending
anteroinferiorly from the irregular mass which are located in the
upper outer quadrant (more laterally) using a superior approach.
Specimen radiograph was performed showing sampling of representative
tissue. Specimens with calcifications are identified for pathology.

Lesion quadrant: Upper outer quadrant

At the conclusion of the procedure, an X shaped tissue marker clip
was deployed into the biopsy cavity. Follow-up 2-view mammogram was
performed and dictated separately.

Site 3 (Stereotactic Biopsy)-

Using sterile technique and 1% Lidocaine as local anesthetic, under
stereotactic guidance, a 9 gauge vacuum assisted device was used to
perform core needle biopsy of grouped microcalcifications located in
the upper outer quadrant (more medially) using a superior approach.
Specimen radiograph was performed showing sampling of representative
tissue. Specimens with calcifications are identified for pathology.

Lesion quadrant: Upper outer quadrant

At the conclusion of the procedure, a coil shaped tissue marker clip
was deployed into the biopsy cavity. Follow-up 2-view mammogram was
performed and dictated separately.
IMPRESSION: Ultrasound-guided biopsy of an irregular mass in the left breast and
stereotactic-guided two-site biopsy of calcifications in the left
breast. No apparent complications.

## 2020-05-29 IMAGING — MG MM BREAST BX W LOC DEV EA AD LESION IMG BX SPEC STEREO GUIDE*L*
6 of 12 series · 6 of 20 positions shown · non-contrast
Comparison: Previous exam(s).
COMPARISON: Previous exam(s).

Addendum:
CLINICAL DATA: Here for ultrasound-guided biopsy of an irregular
mass in the left breast as well as two-site stereotactic guided
biopsy of calcifications in the left breast.

EXAM:
ULTRASOUND GUIDED LEFT BREAST CORE NEEDLE BIOPSY
LEFT BREAST STEREOTACTIC CORE NEEDLE BIOPSY, TWO SITES

[L (1 of 6)]
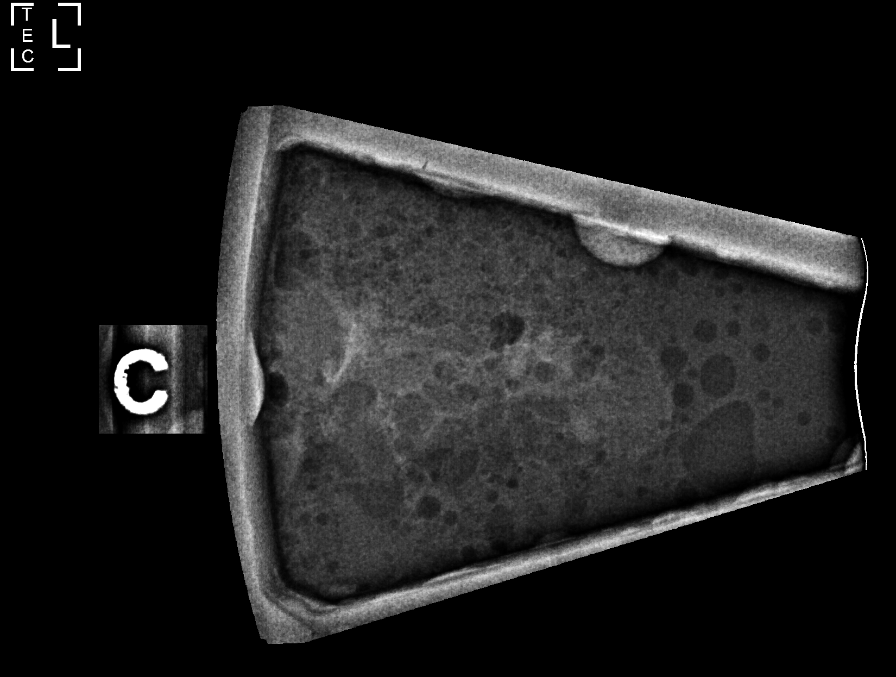

[L (2 of 6)]
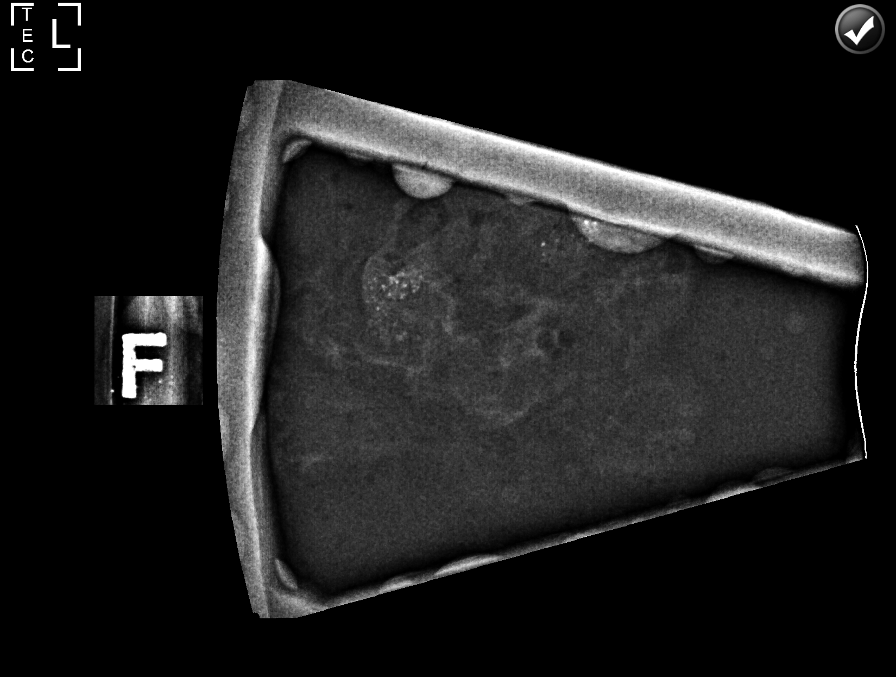

[L (3 of 6)]
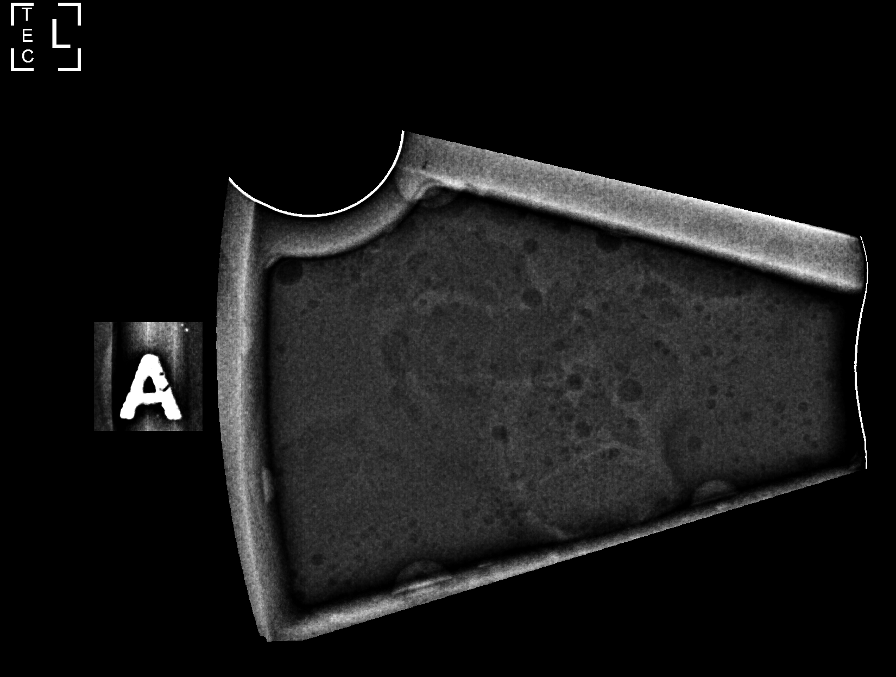

[L (4 of 6)]
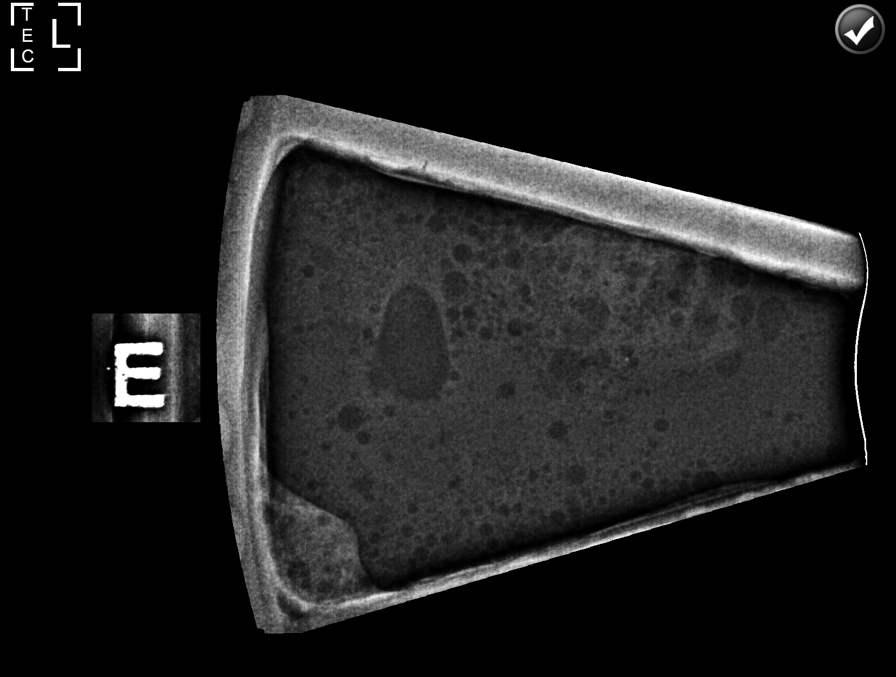

[L (5 of 6)]
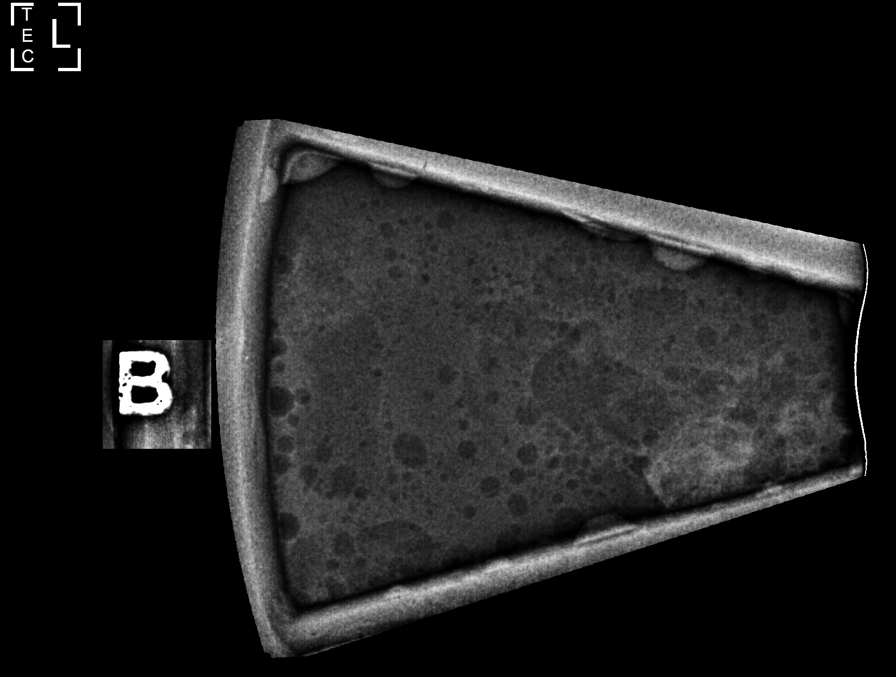

[L (6 of 6)]
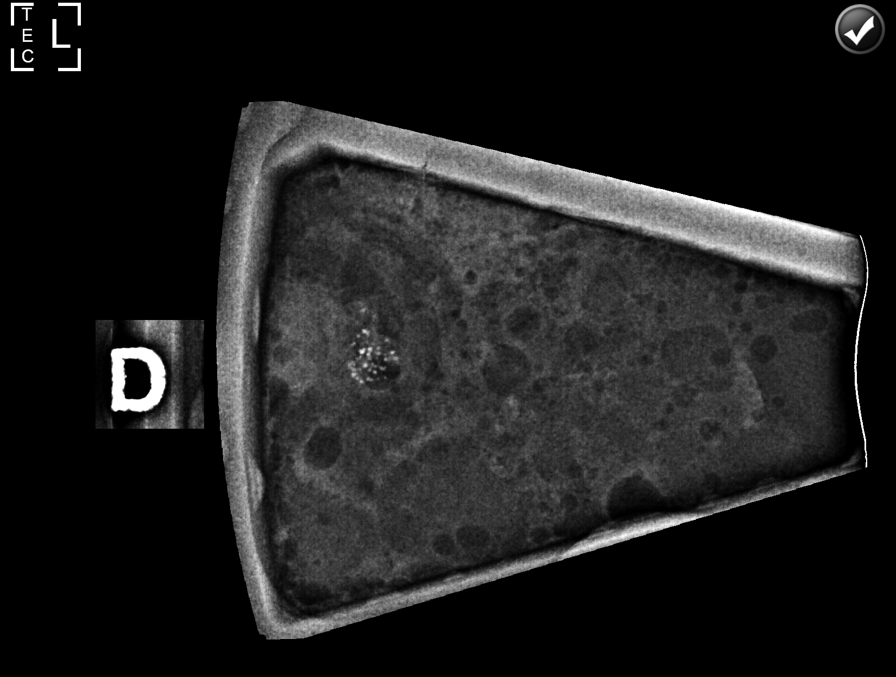

[6 of 20 positions shown; findings below may reference images not displayed]

PROCEDURE:
I met with the patient and we discussed the procedure of
image-guided biopsy, including benefits and alternatives. We
discussed the high likelihood of a successful procedure. We
discussed the risks of the procedure, including infection, bleeding,
tissue injury, clip migration, and inadequate sampling. Informed
written consent was given. The usual time-out protocol was performed
immediately prior to the procedure.

Site 1 (US Biopsy)-

Lesion quadrant: Upper outer quadrant

Using sterile technique and 1% Lidocaine as local anesthetic, under
direct ultrasound visualization, a 12 gauge SOMMERS device was
used to perform biopsy of an irregular hypoechoic mass at 2 o'clock
7 cm from the nipple using a lateral approach. At the conclusion of
the procedure a Vision shaped tissue marker clip was deployed into
the biopsy cavity. Follow up 2 view mammogram was performed and
dictated separately.

Site 2 (Stereotactic Biopsy)-

Using sterile technique and 1% Lidocaine as local anesthetic, under
stereotactic guidance, a 9 gauge vacuum assisted device was used to
perform core needle biopsy of calcifications extending
anteroinferiorly from the irregular mass which are located in the
upper outer quadrant (more laterally) using a superior approach.
Specimen radiograph was performed showing sampling of representative
tissue. Specimens with calcifications are identified for pathology.

Lesion quadrant: Upper outer quadrant

At the conclusion of the procedure, an X shaped tissue marker clip
was deployed into the biopsy cavity. Follow-up 2-view mammogram was
performed and dictated separately.

Site 3 (Stereotactic Biopsy)-

Using sterile technique and 1% Lidocaine as local anesthetic, under
stereotactic guidance, a 9 gauge vacuum assisted device was used to
perform core needle biopsy of grouped microcalcifications located in
the upper outer quadrant (more medially) using a superior approach.
Specimen radiograph was performed showing sampling of representative
tissue. Specimens with calcifications are identified for pathology.

Lesion quadrant: Upper outer quadrant

At the conclusion of the procedure, a coil shaped tissue marker clip
was deployed into the biopsy cavity. Follow-up 2-view mammogram was
performed and dictated separately.
IMPRESSION: Ultrasound-guided biopsy of an irregular mass in the left breast and
stereotactic-guided two-site biopsy of calcifications in the left
breast. No apparent complications.

ADDENDUM:
Pathology revealed GRADE III INVASIVE MAMMARY CARCINOMA, NO SPECIAL
TYPE, HIGH GRADE DUCTAL CARCINOMA IN SITU WITH CALCIFICATIONS of the
LEFT BREAST, 2 o'clock, [BZ], vision clip. This was found to be
concordant by Dr. SOMMERS.

Pathology revealed GRADE III INVASIVE MAMMARY CARCINOMA, NO SPECIAL
TYPE, EXTENSIVE HIGH-GRADE DUCTAL CARCINOMA IN SITU (DCIS) WITH
COMEDONECROSIS, CALCIFICATIONS ASSOCIATED WITH DCIS AND SCLEROSING
ADENOSIS, AREAS SUSPICIOUS FOR LYMPHOVASCULAR INVASION of the LEFT
breast, lateral upper outer quadrant, X clip. This was found to be
concordant by Dr. SOMMERS.

Pathology revealed COLUMNAR CELL CHANGE WITH ASSOCIATED
CALCIFICATIONS, NEGATIVE FOR ATYPIA AND MALIGNANCY of the LEFT
breast calcifications, medial upper outer quadrant, coil clip. This
was found to be concordant by Dr. SOMMERS.

Of note, the area of invasive mammary carcinoma spans at least
cm mammographically.

Pathology results were discussed with the patient by telephone. The
patient reported doing well after the biopsies with tenderness at
the sites. Post biopsy instructions and care were reviewed and
questions were answered. The patient was encouraged to call [HOSPITAL]
[facility] for any
additional concerns.

Surgical and Medical Oncologist referral will be made by SOMMERS
RN Breast Oncology Navigator of [facility]OSP[facility]]. Reports sent via [REDACTED] message to
Oncology Navigator.

Pathology results reported by SOMMERS RN on [DATE].

*** End of Addendum ***
PROCEDURE:
I met with the patient and we discussed the procedure of
image-guided biopsy, including benefits and alternatives. We
discussed the high likelihood of a successful procedure. We
discussed the risks of the procedure, including infection, bleeding,
tissue injury, clip migration, and inadequate sampling. Informed
written consent was given. The usual time-out protocol was performed
immediately prior to the procedure.

Site 1 (US Biopsy)-

Lesion quadrant: Upper outer quadrant

Using sterile technique and 1% Lidocaine as local anesthetic, under
direct ultrasound visualization, a 12 gauge SOMMERS device was
used to perform biopsy of an irregular hypoechoic mass at 2 o'clock
7 cm from the nipple using a lateral approach. At the conclusion of
the procedure a Vision shaped tissue marker clip was deployed into
the biopsy cavity. Follow up 2 view mammogram was performed and
dictated separately.

Site 2 (Stereotactic Biopsy)-

Using sterile technique and 1% Lidocaine as local anesthetic, under
stereotactic guidance, a 9 gauge vacuum assisted device was used to
perform core needle biopsy of calcifications extending
anteroinferiorly from the irregular mass which are located in the
upper outer quadrant (more laterally) using a superior approach.
Specimen radiograph was performed showing sampling of representative
tissue. Specimens with calcifications are identified for pathology.

Lesion quadrant: Upper outer quadrant

At the conclusion of the procedure, an X shaped tissue marker clip
was deployed into the biopsy cavity. Follow-up 2-view mammogram was
performed and dictated separately.

Site 3 (Stereotactic Biopsy)-

Using sterile technique and 1% Lidocaine as local anesthetic, under
stereotactic guidance, a 9 gauge vacuum assisted device was used to
perform core needle biopsy of grouped microcalcifications located in
the upper outer quadrant (more medially) using a superior approach.
Specimen radiograph was performed showing sampling of representative
tissue. Specimens with calcifications are identified for pathology.

Lesion quadrant: Upper outer quadrant

At the conclusion of the procedure, a coil shaped tissue marker clip
was deployed into the biopsy cavity. Follow-up 2-view mammogram was
performed and dictated separately.
IMPRESSION: Ultrasound-guided biopsy of an irregular mass in the left breast and
stereotactic-guided two-site biopsy of calcifications in the left
breast. No apparent complications.

## 2020-05-30 DIAGNOSIS — D0512 Intraductal carcinoma in situ of left breast: Secondary | ICD-10-CM

## 2020-05-30 DIAGNOSIS — C50919 Malignant neoplasm of unspecified site of unspecified female breast: Secondary | ICD-10-CM

## 2020-05-30 NOTE — Progress Notes (Signed)
Patient ID: Candice Bowers, female   DOB: 1955-05-18, 65 y.o.   MRN: [id number] Initiated navigation.  Patient scheduled for Med/Onc consult with Dr. Rogue Bussing, and Surgical consult with Dr. Celine Ahr on 06/06/20.

## 2020-06-04 LAB — SURGICAL PATHOLOGY

## 2020-06-05 ENCOUNTER — Other Ambulatory Visit: Payer: Self-pay | Admitting: *Deleted

## 2020-06-06 ENCOUNTER — Inpatient Hospital Stay: Payer: 59

## 2020-06-06 ENCOUNTER — Inpatient Hospital Stay: Payer: 59 | Attending: Internal Medicine | Admitting: Internal Medicine

## 2020-06-06 ENCOUNTER — Other Ambulatory Visit: Payer: Self-pay

## 2020-06-06 ENCOUNTER — Encounter: Payer: Self-pay | Admitting: *Deleted

## 2020-06-06 ENCOUNTER — Encounter: Payer: Self-pay | Admitting: Internal Medicine

## 2020-06-06 ENCOUNTER — Ambulatory Visit (INDEPENDENT_AMBULATORY_CARE_PROVIDER_SITE_OTHER): Payer: 59 | Admitting: General Surgery

## 2020-06-06 ENCOUNTER — Encounter: Payer: Self-pay | Admitting: General Surgery

## 2020-06-06 VITALS — BP 156/83 | HR 65 | Temp 98.3°F | Ht 64.0 in | Wt 156.0 lb

## 2020-06-06 DIAGNOSIS — Z8041 Family history of malignant neoplasm of ovary: Secondary | ICD-10-CM

## 2020-06-06 DIAGNOSIS — Z170 Estrogen receptor positive status [ER+]: Secondary | ICD-10-CM | POA: Diagnosis not present

## 2020-06-06 DIAGNOSIS — Z79899 Other long term (current) drug therapy: Secondary | ICD-10-CM

## 2020-06-06 DIAGNOSIS — C50412 Malignant neoplasm of upper-outer quadrant of left female breast: Secondary | ICD-10-CM

## 2020-06-06 DIAGNOSIS — Z803 Family history of malignant neoplasm of breast: Secondary | ICD-10-CM

## 2020-06-06 HISTORY — DX: Estrogen receptor positive status (ER+): Z17.0

## 2020-06-06 HISTORY — DX: Malignant neoplasm of upper-outer quadrant of left female breast: C50.412

## 2020-06-06 NOTE — Progress Notes (Signed)
Patient ID: Candice Bowers, female   DOB: December 02, 1955, 65 y.o.   MRN: [id number]  Chief Complaint  Patient presents with  . New Patient (Initial Visit)    Breast Cancer    HPI Candice Bowers is a 65 y.o. female.   She is here today to discuss surgery for newly diagnosed left breast cancer.  The area of concern was discovered on routine screening mammogram.  She subsequently underwent biopsy of 3 separate areas.  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  These were ER positive, PR and HER-2 negative.  She has a history of prior right breast biopsy 20 years ago that was negative for malignant pathology.  She has used both birth control and hormone replacement therapy in the past.  She underwent menopause at the age of 30.  She denies any family history of breast cancer.  Menarche was at the age of 58.  She had 1 pregnancy at the age of 51 and did breast-feed the child.  She did not palpate a lump or mass.  She has not experienced any skin changes or nipple discharge.  She denies any breast pain.  She reports that she does not perform monthly self exams.  She met with Dr. Rogue Bussing earlier today.   Past Medical History:  Diagnosis Date  . Carcinoma of upper-outer quadrant of left breast in female, estrogen receptor positive ([place]) 06/06/2020  . Hyperlipidemia     Past Surgical History:  Procedure Laterality Date  . BREAST BIOPSY Left 05/29/2020   affirm bx "X" clp-path pending  . BREAST BIOPSY Left 05/29/2020   Affirm bx"Coil" clip-path pending  . BREAST BIOPSY Right 2001   excisional biopsy  . BREAST EXCISIONAL BIOPSY Right 06/19/1999   neg  . TUBAL LIGATION      Family History  Problem Relation Age of Onset  . Ovarian cancer Sister 63  . Cancer Maternal Grandmother   . Breast cancer Neg Hx     Social History Social History   Tobacco Use  . Smoking status: Former Smoker    Years: 20.00    Types: Cigarettes  . Smokeless tobacco: Never Used  Vaping Use  . Vaping Use: Never  used  Substance Use Topics  . Alcohol use: Never  . Drug use: Never    No Known Allergies  Current Outpatient Medications  Medication Sig Dispense Refill  . Aspirin-Acetaminophen-Caffeine (EXCEDRIN MIGRAINE PO) Take 1 tablet by mouth as needed.    Marland Kitchen atorvastatin (LIPITOR) 10 MG tablet Take 1 tablet by mouth daily.    . Cholecalciferol 25 MCG (1000 UT) tablet Take 1 tablet by mouth daily.    . Omega-3 Fatty Acids (FISH OIL) 1000 MG CAPS Take 1 capsule by mouth daily.    Marland Kitchen omeprazole (PRILOSEC) 20 MG capsule Take 1 capsule by mouth daily.    . valsartan (DIOVAN) 80 MG tablet Take 1 tablet by mouth daily.     No current facility-administered medications for this visit.    Review of Systems Review of Systems  All other systems reviewed and are negative.   Blood pressure (!) 156/83, pulse 65, temperature 98.3 F (36.8 C), height '5\' 4"'  (1.626 m), weight 156 lb (70.8 kg), SpO2 97 %. Body mass index is 26.78 kg/m..mi  Physical Exam Physical Exam Constitutional:      General: She is not in acute distress.    Appearance: Normal appearance.  HENT:     Head: Normocephalic and atraumatic.     Nose:  Comments: Covered with a mask    Mouth/Throat:     Comments: Covered with a mask Eyes:     General: No scleral icterus.       Right eye: No discharge.        Left eye: No discharge.     Conjunctiva/sclera: Conjunctivae normal.     Comments: Wearing glasses  Neck:     Comments: No palpable cervical or supraclavicular lymphadenopathy.  The trachea is midline.  No dominant thyroid masses or thyromegaly appreciated.  The gland moves freely with deglutition. Cardiovascular:     Rate and Rhythm: Normal rate and regular rhythm.     Pulses: Normal pulses.  Pulmonary:     Effort: Pulmonary effort is normal.     Breath sounds: Normal breath sounds.  Chest:  Breasts:     Right: Normal. No axillary adenopathy or supraclavicular adenopathy.     Left: No swelling, bleeding, inverted  nipple, mass, nipple discharge, skin change, tenderness, axillary adenopathy or supraclavicular adenopathy.        Comments: Biopsy sites with accompanying mild ecchymoses.  No discrete masses palpated. Abdominal:     General: Bowel sounds are normal.     Palpations: Abdomen is soft.  Genitourinary:    Comments: Deferred Musculoskeletal:        General: No swelling or deformity.     Right lower leg: No edema.     Left lower leg: No edema.  Lymphadenopathy:     Upper Body:     Right upper body: No supraclavicular, axillary or pectoral adenopathy.     Left upper body: No supraclavicular, axillary or pectoral adenopathy.  Skin:    General: Skin is warm and dry.  Neurological:     General: No focal deficit present.     Mental Status: She is alert and oriented to person, place, and time.  Psychiatric:        Mood and Affect: Mood normal.        Behavior: Behavior normal.     Data Reviewed Imaging and pathology reviewed:  CLINICAL DATA:  Status post ultrasound-guided biopsy of a mass in the left breast and two-site stereotactic guided biopsy of calcifications in the left breast.  EXAM: 3D DIAGNOSTIC LEFT MAMMOGRAM POST ULTRASOUND AND STEREOTACTIC GUIDED BIOPSIES  COMPARISON:  Previous exam(s).  FINDINGS: 3D Mammographic images were obtained following ultrasound guided biopsy of a mass in the left breast at 2 o'clock 7 cm from the nipple and stereotactic guided two-site biopsy of two groups of microcalcifications in the upper-outer quadrant of the left breast. The biopsy marking clips are in expected position at the sites of biopsy.  IMPRESSION: Appropriate positioning of the Vision, X, and coil shaped biopsy marking clips at the sites of biopsy in the left breast.  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 This was found to be concordant by Dr. Zerita Boers.  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 This was found to be concordant by Dr. Zerita Boers.  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 This was found to be concordant by Dr. Zerita Boers.  Of note, the area of invasive mammary carcinoma spans at least 1.9 cm mammographically.  Pathology results were discussed with the patient by telephone. The patient reported doing well after the biopsies with tenderness at the sites. Post biopsy instructions and care were reviewed and questions were answered. The patient was encouraged to call [facility] for any additional concerns.  Surgical and Medical Oncologist referral will be made by Al Pimple RN Breast Oncology Navigator of [place] [facility]. Reports sent via Epic message to Oncology Navigator.  I also reviewed the clinic note from Dr. Rogue Bussing from earlier today, discussing the oncology side of her treatment plan.  Assessment and plan This is a 65 year old woman with newly diagnosed left breast cancer.  Based upon my reading of the imaging reports and the biopsy results, there appear to be 2 separate areas of malignancy.  She is interested in breast conservation therapy.  I would like to have a better understanding of the spatial relationships between the tumor sites prior to  commencing with surgical intervention.  We will order an MRI to aid in this process.  This may distinguish whether or not I am able to perform a single large lumpectomy versus 2 smaller lumpectomies.  Either way, we will plan to perform sentinel node biopsy.  The patient has requested that we delay the MRI until after March 1, at which time she will be eligible for Medicare.  We will accommodate this request and work on getting her scheduled.  Once I have the results, I will contact her and discuss the surgical plan and make arrangements for surgery.     Fredirick Maudlin 06/06/2020, 3:00 PM

## 2020-06-06 NOTE — Progress Notes (Signed)
Met patient and her husband during her initial medical oncology consult with Dr. Rogue Bussing.  Gave patient breast cancer educational literature, "My Breast Cancer Treatment Handbook" by Josephine Igo, RN.  Reviewed possible treatment plan, and possible Oncotype DX testing.  Final plan to be determined after surgery.  Patient is to let Webb Silversmith or myself know her surgery date and we will schedule her a follow up appointment with Dr. Rogue Bussing.

## 2020-06-06 NOTE — Progress Notes (Signed)
one [place] NOTE  Patient Care Team: Sofie Hartigan, MD as PCP - General (Family Medicine) Theodore Demark, RN as Oncology Nurse Navigator  CHIEF COMPLAINTS/PURPOSE OF CONSULTATION: Breast cancer  #  Oncology History Overview Note  # JAN 2022-  0.5 cm irregular UPPER-OUTER LEFT breast mass with calcifications extending 1.5 cm anteroinferiorly, suspicious for malignancy. Ultrasound-guided biopsy of the mass is recommended. Stereotactic guided biopsy of the anterior/inferior most calcifications extending from this mass is also recommended. 2. Indeterminate 0.4 cm group of UPPER-OUTER LEFT breast calcifications, located 2.5 cm from the irregular mass above. 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 This was found to be concordant by Dr. Zerita Boers.   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 This was found to be concordant by Dr. Zerita Boers.   Pathology revealed COLUMNAR CELL CHANGE WITH ASSOCIATED CALCIFICATIONS, NEGATIVE FOR ATYPIA AND MALIGNANCY of the LEFT breast calcifications, medial upper outer quadrant, coil clip. This was found to be concordant by Dr. Zerita Boers.   Of note, the area of invasive mammary carcinoma spans at least 1.9 cm mammographically.  # BREAST, LEFT 2:00 7 CM FN; ULTRASOUND-GUIDED BIOPSY (VISION CLIP):  - INVASIVE MAMMARY CARCINOMA, NO SPECIAL TYPE.  Size of invasive carcinoma: 5 mm in this sample  Histologic grade of invasive carcinoma: Grade 3                       Glandular/tubular  differentiation score: 3                       Nuclear pleomorphism score: 3                       Mitotic rate score: 2                       Total score: 8  Ductal carcinoma in situ: Present, high-grade with calcification  Lymphovascular invasion: Not identified   B.  BREAST WITH CALCIFICATIONS, LEFT LATERAL UPPER OUTER QUADRANT;  STEREOTACTIC BIOPSY (X SHAPED CLIP):  - INVASIVE MAMMARY CARCINOMA, NO SPECIAL TYPE.  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  - CALCIFICATIONS ASSOCIATED WITH DCIS AND SCLEROSING ADENOSIS.  - AREAS SUSPICIOUS FOR LYMPHOVASCULAR INVASION.  Size of invasive carcinoma: 4 mm in this sample  Histologic grade of invasive carcinoma: Grade 3                       Glandular/tubular differentiation score: 3                       Nuclear pleomorphism score: 3                       Mitotic rate score: 2                       Total score: 8   C.  BREAST WITH CALCIFICATIONS, LEFT MEDIAL UPPER OUTER QUADRANT;  STEREOTACTIC BIOPSY (COIL-SHAPED CLIP):  - COLUMNAR CELL  CHANGE WITH ASSOCIATED CALCIFICATIONS.  - NEGATIVE FOR ATYPIA AND MALIGNANCY. BIOPSY- ER > 90%; PR-NEG; Her-2 NEG   Carcinoma of upper-outer quadrant of left breast in female, estrogen receptor positive ([place])  06/06/2020 Initial Diagnosis   Carcinoma of upper-outer quadrant of left breast in female, estrogen receptor positive ([place])   06/06/2020 Cancer Staging   Staging form: Breast, AJCC 8th Edition - Clinical: Stage IB (cT1b, cN0, cM0, G3, ER+, PR-, HER2-) - Signed by Cammie Sickle, MD on 06/06/2020 Stage prefix: Initial diagnosis      HISTORY OF PRESENTING ILLNESS:  [place] 65 y.o.  female female with no prior history of breast cancer/or malignancies has been referred to [place] for further evaluation recommendations for new diagnosis of breast cancer.   Patient states she was found to have an abnormal screening mammogram which led to diagnostic  mammogram/ultrasound/followed by biopsy-as summarized above.  Interestingly patient had a mammogram December 2020 which was negative/unremarkable.  Family history of breast cancer:  Family history of other cancers: ovarian cancer-sister- 55y x 16 years ago.  Used OCP: 10 years- in 20-30s.   Used estrogen and progesterone therapy: estrogen in 40s-10 years  History of Radiation to the chest:none  Number of pregnancies: 1 daughter-33y Previous biopsy: right side x 20 years    Review of Systems  Constitutional: Negative for chills, diaphoresis, fever, malaise/fatigue and weight loss.  HENT: Negative for nosebleeds and sore throat.   Eyes: Negative for double vision.  Respiratory: Negative for cough, hemoptysis, sputum production, shortness of breath and wheezing.   Cardiovascular: Negative for chest pain, palpitations, orthopnea and leg swelling.  Gastrointestinal: Negative for abdominal pain, blood in stool, constipation, diarrhea, heartburn, melena, nausea and vomiting.  Genitourinary: Negative for dysuria, frequency and urgency.  Musculoskeletal: Negative for back pain and joint pain.  Skin: Negative.  Negative for itching and rash.  Neurological: Negative for dizziness, tingling, focal weakness, weakness and headaches.  Endo/Heme/Allergies: Does not bruise/bleed easily.  Psychiatric/Behavioral: Negative for depression. The patient is not nervous/anxious and does not have insomnia.      MEDICAL HISTORY:  Past Medical History:  Diagnosis Date  . Carcinoma of upper-outer quadrant of left breast in female, estrogen receptor positive ([place]) 06/06/2020    SURGICAL HISTORY: Past Surgical History:  Procedure Laterality Date  . BREAST BIOPSY Left 05/29/2020   affirm bx "X" clp-path pending  . BREAST BIOPSY Left 05/29/2020   Affirm bx"Coil" clip-path pending  . BREAST EXCISIONAL BIOPSY Right 06/19/1999   neg    SOCIAL HISTORY: Social History   Socioeconomic History  . Marital  status: Married    Spouse name: Not on file  . Number of children: Not on file  . Years of education: Not on file  . Highest education level: Not on file  Occupational History  . Not on file  Tobacco Use  . Smoking status: Not on file  . Smokeless tobacco: Not on file  Substance and Sexual Activity  . Alcohol use: Not on file  . Drug use: Not on file  . Sexual activity: Not on file  Other Topics Concern  . Not on file  Social History Narrative  . Not on file   Social Determinants of Health   Financial Resource Strain: Not on file  Food Insecurity: Not on file  Transportation Needs: Not on file  Physical Activity: Not on file  Stress: Not on file  Social Connections: Not on file  Intimate Partner Violence: Not on file    FAMILY  HISTORY: Family History  Problem Relation Age of Onset  . Breast cancer Neg Hx     ALLERGIES:  has no allergies on file.  MEDICATIONS:  Current Outpatient Medications  Medication Sig Dispense Refill  . Aspirin-Acetaminophen-Caffeine (EXCEDRIN MIGRAINE PO) Take 1 tablet by mouth daily.    Marland Kitchen atorvastatin (LIPITOR) 10 MG tablet Take 1 tablet by mouth daily.    . Cholecalciferol 25 MCG (1000 UT) tablet Take 1 tablet by mouth daily.    . Omega-3 Fatty Acids (FISH OIL) 1000 MG CAPS Take 1 capsule by mouth daily.    Marland Kitchen omeprazole (PRILOSEC) 20 MG capsule Take 1 capsule by mouth daily.    . valsartan (DIOVAN) 80 MG tablet Take 1 tablet by mouth daily.     No current facility-administered medications for this visit.      Marland Kitchen  PHYSICAL EXAMINATION: ECOG PERFORMANCE STATUS: 0 - Asymptomatic  Vitals:   06/06/20 1133  BP: 130/71  Pulse: 67  Resp: 20  Temp: (!) 97.4 F (36.3 C)   There were no vitals filed for this visit.  Physical Exam HENT:     Head: Normocephalic and atraumatic.     Mouth/Throat:     Mouth: Oropharynx is clear and moist.     Pharynx: No oropharyngeal exudate.  Eyes:     Pupils: Pupils are equal, round, and reactive to  light.  Cardiovascular:     Rate and Rhythm: Normal rate and regular rhythm.  Pulmonary:     Effort: No respiratory distress.     Breath sounds: No wheezing.  Abdominal:     General: Bowel sounds are normal. There is no distension.     Palpations: Abdomen is soft. There is no mass.     Tenderness: There is no abdominal tenderness. There is no guarding or rebound.  Musculoskeletal:        General: No tenderness or edema. Normal range of motion.     Cervical back: Normal range of motion and neck supple.  Skin:    General: Skin is warm.  Neurological:     Mental Status: She is alert and oriented to person, place, and time.  Psychiatric:        Mood and Affect: Affect normal.      LABORATORY DATA:  I have reviewed the data as listed No results found for: WBC, HGB, HCT, MCV, PLT No results for input(s): NA, K, CL, CO2, GLUCOSE, BUN, CREATININE, CALCIUM, GFRNONAA, GFRAA, PROT, ALBUMIN, AST, ALT, ALKPHOS, BILITOT, BILIDIR, IBILI in the last 8760 hours.  RADIOGRAPHIC STUDIES: I have personally reviewed the radiological images as listed and agreed with the findings in the report. US BREAST LTD UNI LEFT INC AXILLA  Result Date: 05/14/2020 CLINICAL DATA:  65 year old female for further evaluation of possible LEFT breast mass and calcifications identified on screening mammogram. EXAM: DIGITAL DIAGNOSTIC LEFT MAMMOGRAM WITH CAD AND TOMOSYNTHESIS ULTRASOUND LEFT BREAST TECHNIQUE: Left digital diagnostic mammography and breast tomosynthesis was performed. Digital images of the breasts were evaluated with computer-aided detection. Targeted ultrasound examination of the Left breast was performed. COMPARISON:  Previous exam(s). ACR Breast Density Category b: There are scattered areas of fibroglandular density. FINDINGS: Full field, spot compression and magnification views of the LEFT breast are performed. A persistent 0.5 cm irregular mass within the UPPER-OUTER LEFT breast is identified, middle depth.  Extending 1.5 cm anteroinferiorly from this irregular mass are calcifications in a linear orientation. An additional 0.4 cm group of primarily punctate calcifications are also noted within the  UPPER-OUTER LEFT breast, located 2.5 cm medial and 1.5 cm anterior to the irregular mass. Targeted ultrasound is performed, showing a 0.5 x 0.4 x 0.3 cm irregular hypoechoic mass at the 2 o'clock position of the LEFT breast 7 cm from the nipple, corresponding to the mammographic mass. No abnormal LEFT axillary lymph nodes are identified. IMPRESSION: 1. 0.5 cm irregular UPPER-OUTER LEFT breast mass with calcifications extending 1.5 cm anteroinferiorly, suspicious for malignancy. Ultrasound-guided biopsy of the mass is recommended. Stereotactic guided biopsy of the anterior/inferior most calcifications extending from this mass is also recommended. 2. Indeterminate 0.4 cm group of UPPER-OUTER LEFT breast calcifications, located 2.5 cm from the irregular mass above. Tissue sampling is recommended. RECOMMENDATION: 1. Ultrasound-guided biopsy of 0.5 cm UPPER-OUTER LEFT breast mass. 2. Stereotactic guided biopsy of anterior/inferior most calcifications extending from the 0.5 cm UPPER-OUTER LEFT breast mass mass. 3. Stereotactic guided biopsy of 0.4 cm separate group of calcifications within the UPPER-OUTER LEFT breast. I have discussed the findings and recommendations with the patient. If applicable, a reminder letter will be sent to the patient regarding the next appointment. BI-RADS CATEGORY  4: Suspicious. Electronically Signed   By: Margarette Canada M.D.   On: 05/14/2020 12:10   MM DIAG BREAST TOMO UNI LEFT  Result Date: 05/14/2020 CLINICAL DATA:  65 year old female for further evaluation of possible LEFT breast mass and calcifications identified on screening mammogram. EXAM: DIGITAL DIAGNOSTIC LEFT MAMMOGRAM WITH CAD AND TOMOSYNTHESIS ULTRASOUND LEFT BREAST TECHNIQUE: Left digital diagnostic mammography and breast tomosynthesis  was performed. Digital images of the breasts were evaluated with computer-aided detection. Targeted ultrasound examination of the Left breast was performed. COMPARISON:  Previous exam(s). ACR Breast Density Category b: There are scattered areas of fibroglandular density. FINDINGS: Full field, spot compression and magnification views of the LEFT breast are performed. A persistent 0.5 cm irregular mass within the UPPER-OUTER LEFT breast is identified, middle depth. Extending 1.5 cm anteroinferiorly from this irregular mass are calcifications in a linear orientation. An additional 0.4 cm group of primarily punctate calcifications are also noted within the UPPER-OUTER LEFT breast, located 2.5 cm medial and 1.5 cm anterior to the irregular mass. Targeted ultrasound is performed, showing a 0.5 x 0.4 x 0.3 cm irregular hypoechoic mass at the 2 o'clock position of the LEFT breast 7 cm from the nipple, corresponding to the mammographic mass. No abnormal LEFT axillary lymph nodes are identified. IMPRESSION: 1. 0.5 cm irregular UPPER-OUTER LEFT breast mass with calcifications extending 1.5 cm anteroinferiorly, suspicious for malignancy. Ultrasound-guided biopsy of the mass is recommended. Stereotactic guided biopsy of the anterior/inferior most calcifications extending from this mass is also recommended. 2. Indeterminate 0.4 cm group of UPPER-OUTER LEFT breast calcifications, located 2.5 cm from the irregular mass above. Tissue sampling is recommended. RECOMMENDATION: 1. Ultrasound-guided biopsy of 0.5 cm UPPER-OUTER LEFT breast mass. 2. Stereotactic guided biopsy of anterior/inferior most calcifications extending from the 0.5 cm UPPER-OUTER LEFT breast mass mass. 3. Stereotactic guided biopsy of 0.4 cm separate group of calcifications within the UPPER-OUTER LEFT breast. I have discussed the findings and recommendations with the patient. If applicable, a reminder letter will be sent to the patient regarding the next  appointment. BI-RADS CATEGORY  4: Suspicious. Electronically Signed   By: Margarette Canada M.D.   On: 05/14/2020 12:10   MM CLIP PLACEMENT LEFT  Result Date: 05/29/2020 CLINICAL DATA:  Status post ultrasound-guided biopsy of a mass in the left breast and two-site stereotactic guided biopsy of calcifications in the left breast. EXAM: 3D  DIAGNOSTIC LEFT MAMMOGRAM POST ULTRASOUND AND STEREOTACTIC GUIDED BIOPSIES COMPARISON:  Previous exam(s). FINDINGS: 3D Mammographic images were obtained following ultrasound guided biopsy of a mass in the left breast at 2 o'clock 7 cm from the nipple and stereotactic guided two-site biopsy of two groups of microcalcifications in the upper-outer quadrant of the left breast. The biopsy marking clips are in expected position at the sites of biopsy. IMPRESSION: Appropriate positioning of the Vision, X, and coil shaped biopsy marking clips at the sites of biopsy in the left breast. Final Assessment: Post Procedure Mammograms for Marker Placement Electronically Signed   By: Romona Curls M.D.   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 This was found to be concordant by Dr. Romona Curls. 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 This was found to be concordant by Dr. Romona Curls. Pathology revealed COLUMNAR CELL CHANGE WITH ASSOCIATED CALCIFICATIONS, NEGATIVE FOR ATYPIA AND MALIGNANCY of the LEFT breast calcifications, medial  upper outer quadrant, coil clip. This was found to be concordant by Dr. Romona Curls. Of note, the area of invasive mammary carcinoma spans at least 1.9 cm mammographically. Pathology results were discussed with the patient by telephone. The patient reported doing well after the biopsies with tenderness at the sites. Post biopsy instructions and care were reviewed and questions were answered. The patient was encouraged to call [facility] of [facility] for any additional concerns. Surgical and Medical Oncologist referral will be made by Coralee Rud RN Breast Oncology Navigator of [facility] of [facility]. Reports sent via Epic message to Oncology Navigator. Pathology results reported by Collene Mares RN on 05/30/2020. Electronically Signed   By: Romona Curls M.D.   On: 05/30/2020 16:23   Result Date: 05/30/2020 CLINICAL DATA:  Here for ultrasound-guided biopsy of an irregular mass in the left breast as well as two-site stereotactic guided biopsy of calcifications in the left breast. EXAM: ULTRASOUND GUIDED LEFT BREAST CORE NEEDLE BIOPSY LEFT BREAST STEREOTACTIC CORE NEEDLE BIOPSY, TWO SITES COMPARISON:  Previous exam(s). PROCEDURE: I met with the patient and we discussed the procedure of image-guided biopsy, including benefits and alternatives. We discussed the high likelihood of a successful procedure. We discussed the risks of the procedure, including infection, bleeding, tissue injury, clip migration, and inadequate sampling. Informed written consent was given. The usual time-out protocol was performed immediately prior to the procedure. Site 1 (US Biopsy)- Lesion quadrant: Upper outer quadrant Using sterile technique and 1% Lidocaine as local anesthetic, under direct ultrasound visualization, a 12 gauge spring-loaded device was used to perform biopsy of an irregular hypoechoic mass at 2 o'clock 7 cm from the nipple using a lateral  approach. At the conclusion of the procedure a Vision shaped tissue marker clip was deployed into the biopsy cavity. Follow up 2 view mammogram was performed and dictated separately. Site 2 (Stereotactic Biopsy)- Using sterile technique and 1% Lidocaine as local anesthetic, under stereotactic guidance, a 9 gauge vacuum assisted device was used  to perform core needle biopsy of calcifications extending anteroinferiorly from the irregular mass which are located in the upper outer quadrant (more laterally) using a superior approach. Specimen radiograph was performed showing sampling of representative tissue. Specimens with calcifications are identified for pathology. Lesion quadrant: Upper outer quadrant At the conclusion of the procedure, an X shaped tissue marker clip was deployed into the biopsy cavity. Follow-up 2-view mammogram was performed and dictated separately. Site 3 (Stereotactic Biopsy)- Using sterile technique and 1% Lidocaine as local anesthetic, under stereotactic guidance, a 9 gauge vacuum assisted device was used to perform core needle biopsy of grouped microcalcifications located in the upper outer quadrant (more medially) using a superior approach. Specimen radiograph was performed showing sampling of representative tissue. Specimens with calcifications are identified for pathology. Lesion quadrant: Upper outer quadrant At the conclusion of the procedure, a coil shaped tissue marker clip was deployed into the biopsy cavity. Follow-up 2-view mammogram was performed and dictated separately. IMPRESSION: Ultrasound-guided biopsy of an irregular mass in the left breast and stereotactic-guided two-site biopsy of calcifications in the left breast. No apparent complications. Electronically Signed: By: Zerita Boers M.D. 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 This was found to be concordant by Dr. Zerita Boers. 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 This was found to be concordant by Dr. Zerita Boers. Pathology revealed COLUMNAR CELL CHANGE WITH ASSOCIATED CALCIFICATIONS, NEGATIVE FOR ATYPIA AND MALIGNANCY of the LEFT breast calcifications, medial upper outer quadrant, coil clip. This was found to be concordant by Dr. Zerita Boers. Of note, the area of invasive mammary carcinoma spans at least 1.9 cm mammographically. Pathology results were discussed with the patient by telephone. The patient reported doing well after the biopsies with tenderness at the sites. Post biopsy instructions and care were reviewed and questions were answered. The patient was encouraged to call [facility] of [facility] for any additional concerns. Surgical and Medical Oncologist referral will be made by Al Pimple RN Breast Oncology Navigator of [place] [facility]. Reports sent via Epic message to Oncology Navigator. Pathology results reported by Stacie Acres RN on 05/30/2020. Electronically Signed   By: Zerita Boers M.D.   On: 05/30/2020 16:23   Result Date: 05/30/2020 CLINICAL DATA:  Here for ultrasound-guided biopsy of an irregular mass in the left breast as well as two-site stereotactic guided biopsy of calcifications in the left breast. EXAM: ULTRASOUND GUIDED LEFT BREAST CORE NEEDLE BIOPSY LEFT BREAST STEREOTACTIC CORE NEEDLE BIOPSY, TWO SITES COMPARISON:  Previous exam(s). PROCEDURE:  I met with the patient and we discussed the procedure of image-guided biopsy, including benefits and alternatives. We discussed the high likelihood of a successful procedure. We discussed the risks of the procedure, including infection, bleeding, tissue injury, clip migration, and inadequate sampling. Informed written consent was given. The usual time-out protocol was performed immediately prior to the procedure. Site 1 (US Biopsy)- Lesion quadrant: Upper outer quadrant Using sterile technique and 1%  Lidocaine as local anesthetic, under direct ultrasound visualization, a 12 gauge spring-loaded device was used to perform biopsy of an irregular hypoechoic mass at 2 o'clock 7 cm from the nipple using a lateral approach. At the conclusion of the procedure a Vision shaped tissue marker clip was deployed into the biopsy cavity. Follow up 2 view mammogram was performed and dictated separately. Site 2 (Stereotactic Biopsy)- Using sterile technique and 1% Lidocaine as local anesthetic, under stereotactic guidance, a 9 gauge vacuum assisted device was used to perform core needle biopsy of calcifications extending anteroinferiorly from the irregular mass which are located in the upper outer quadrant (more laterally) using a superior approach. Specimen radiograph was performed showing sampling of representative tissue. Specimens with calcifications are identified for pathology. Lesion quadrant: Upper outer quadrant At the conclusion of the procedure, an X shaped tissue marker clip was deployed into the biopsy cavity. Follow-up 2-view mammogram was performed and dictated separately. Site 3 (Stereotactic Biopsy)- Using sterile technique and 1% Lidocaine as local anesthetic, under stereotactic guidance, a 9 gauge vacuum assisted device was used to perform core needle biopsy of grouped microcalcifications located in the upper outer quadrant (more medially) using a superior approach. Specimen radiograph was performed showing  sampling of representative tissue. Specimens with calcifications are identified for pathology. Lesion quadrant: Upper outer quadrant At the conclusion of the procedure, a coil shaped tissue marker clip was deployed into the biopsy cavity. Follow-up 2-view mammogram was performed and dictated separately. IMPRESSION: Ultrasound-guided biopsy of an irregular mass in the left breast and stereotactic-guided two-site biopsy of calcifications in the left breast. No apparent complications. Electronically Signed: By: Romona Curls M.D. 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 This was found to be concordant by Dr. Romona Curls. 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 This was found to be concordant by Dr. Romona Curls. Pathology revealed COLUMNAR CELL CHANGE WITH ASSOCIATED CALCIFICATIONS, NEGATIVE FOR ATYPIA AND MALIGNANCY of the LEFT breast calcifications, medial upper outer quadrant, coil clip. This was found to be concordant by Dr. Romona Curls. Of note, the area of invasive mammary carcinoma spans at least 1.9 cm mammographically. Pathology results were discussed with the patient by telephone. The patient reported doing well after the biopsies with tenderness at the sites. Post biopsy instructions and care were reviewed and questions were answered. The patient was encouraged to call [facility] for any  additional concerns. Surgical and Medical Oncologist referral will be made by Coralee Rud RN Breast Oncology Navigator of [facility]. Reports sent via Epic message to Oncology Navigator. Pathology results reported by Collene Mares RN on 05/30/2020. Electronically Signed   By: Romona Curls M.D.   On: 05/30/2020 16:23   Result Date: 05/30/2020 CLINICAL DATA:  Here for ultrasound-guided biopsy of an irregular mass in the left breast as well as two-site stereotactic guided biopsy of calcifications in the left breast. EXAM: ULTRASOUND GUIDED LEFT BREAST CORE NEEDLE BIOPSY  LEFT BREAST STEREOTACTIC CORE NEEDLE BIOPSY, TWO SITES COMPARISON:  Previous exam(s). PROCEDURE: I met with the patient and we discussed the procedure of image-guided biopsy, including benefits and alternatives. We discussed the high likelihood of a successful procedure. We discussed the risks of the procedure, including infection, bleeding, tissue injury, clip migration, and inadequate sampling. Informed written consent was given. The usual time-out protocol was performed immediately prior to the procedure. Site 1 (US Biopsy)- Lesion quadrant: Upper outer quadrant Using sterile technique and 1% Lidocaine as local anesthetic, under direct ultrasound visualization, a 12 gauge spring-loaded device was used to perform biopsy of an irregular hypoechoic mass at 2 o'clock 7 cm from the nipple using a lateral approach. At the conclusion of the procedure a Vision shaped tissue marker clip was deployed into the biopsy cavity. Follow up 2 view mammogram was performed and dictated separately. Site 2 (Stereotactic Biopsy)- Using sterile technique and 1% Lidocaine as local anesthetic, under stereotactic guidance, a 9 gauge vacuum assisted device was used to perform core needle biopsy of calcifications extending anteroinferiorly from the irregular mass which are located in the upper outer quadrant (more laterally)  using a superior approach. Specimen radiograph was performed showing sampling of representative tissue. Specimens with calcifications are identified for pathology. Lesion quadrant: Upper outer quadrant At the conclusion of the procedure, an X shaped tissue marker clip was deployed into the biopsy cavity. Follow-up 2-view mammogram was performed and dictated separately. Site 3 (Stereotactic Biopsy)- Using sterile technique and 1% Lidocaine as local anesthetic, under stereotactic guidance, a 9 gauge vacuum assisted device was used to perform core needle biopsy of grouped microcalcifications located in the upper outer quadrant (more medially) using a superior approach. Specimen radiograph was performed showing sampling of representative tissue. Specimens with calcifications are identified for pathology. Lesion quadrant: Upper outer quadrant At the conclusion of the procedure, a coil shaped tissue marker clip was deployed into the biopsy cavity. Follow-up 2-view mammogram was performed and dictated separately. IMPRESSION: Ultrasound-guided biopsy of an irregular mass in the left breast and stereotactic-guided two-site biopsy of calcifications in the left breast. No apparent complications. Electronically Signed: By: Zerita Boers M.D. On: 05/29/2020 11:52    ASSESSMENT & PLAN:   Carcinoma of upper-outer quadrant of left breast in female, estrogen receptor positive ([place]) # T1N0 [left breast upper quadrant x2 lesions-a] 5 mm lesion b] calcification s/p biopsy x2] stage I breast cancer-ER positive PR negative HER-2 negative.  Both lesions are histologically similar; both ER positive PR negative HER-2 negative.  Discussed with Dr. Reuel Derby.  # I had a long discussion with the patient and husband in general regarding the treatment options of breast cancer including-surgery; adjuvant radiation; role of adjuvant systemic therapy including-chemotherapy antihormone therapy.   # Patient will likely need lumpectomy with  sentinel lymph node evaluation; followed by radiation.  Defer to Dr. Celine Ahr regarding MRI for further evaluation.  Discussed with Dr. Celine Ahr.  #I discussed that decision regarding chemotherapy based on final surgical pathology/gene assay. Patient will benefit from antihormone therapy. I discussed the potential benefits of each option; and also potential downsides in detail.  ## Genetics: Discussed with the patient majority of breast cancers are sporadic however 10 to 20% at risk of genetic/hereditary cancer syndromes.  With family history of ovarian cancer sister-I think is reasonable to do genetic testing.  Discussed importance of genetic counseling/genetic testing.  Patient interested in genetic counseling.  We will make a referral.  # DISPOSITION: # rererral to genetics- Breast cancer #  follow up TBD- Dr.B  Thank you Dr. Ellison Hughs for allowing me to participate in the care of your pleasant patient. Please do not hesitate to contact me with questions or concerns in the interim.  Discussed with Vita Barley, nurse navigator.       All questions were answered. The patient/family knows to call the clinic with any problems, questions or concerns.       Cammie Sickle, MD 06/06/2020 12:30 PM

## 2020-06-06 NOTE — Patient Instructions (Addendum)
We will get you scheduled for a breast MRI to better assess the area for surgery. The [facility] will call you to schedule this.   We will work on surgery scheduling once we get the MRI results.  We have spoken today about removing a lump in your breast. This will be done by Dr. Celine Ahr at [facility].  You will most likely be able to leave the hospital several hours after your surgery. Rarely, a patient needs to stay over night but this is a possibility.  Plan to tenatively be off work for 1-2 weeks following the surgery and may return with approximately 4 more weeks of a lifting restriction, no greater than 15 lbs.  We will send in a prescription for Emla cream to be used the day of surgery for your sentinel node injection.   Please see your Blue surgery sheet. Our surgery scheduler will call you to look at surgery dates and to go over information.     Lumpectomy A lumpectomy is a form of "breast conserving" or "breast preservation" surgery. It may also be referred to as a partial mastectomy. During a lumpectomy, the portion of the breast that contains the cancerous tumor or breast mass (the lump) is removed. Some normal tissue around the lump may also be removed to make sure all of the tumor has been removed.  LET [facility] CARE PROVIDER KNOW ABOUT:  Any allergies you have.  All medicines you are taking, including vitamins, herbs, eye drops, creams, and over-the-counter medicines.  Previous problems you or members of your family have had with the use of anesthetics.  Any blood disorders you have.  Previous surgeries you have had.  Medical conditions you have. RISKS AND COMPLICATIONS Generally, this is a safe procedure. However, problems can occur and include:  Bleeding.  Infection.  Pain.  Temporary swelling.  Change in the shape of the breast, particularly if a large portion is removed. BEFORE THE PROCEDURE  Ask your health care provider about changing or  stopping your regular medicines. This is especially important if you are taking diabetes medicines or blood thinners.  Do not eat or drink anything after midnight on the night before the procedure or as directed by your health care provider. Ask your health care provider if you can take a sip of water with any approved medicines.  On the day of surgery, your health care provider will use a mammogram or ultrasound to locate and mark the tumor in your breast. These markings on your breast will show where the cut (incision) will be made. PROCEDURE   An IV tube will be put into one of your veins.  You may be given medicine to help you relax before the surgery (sedative). You will be given one of the following:  A medicine that numbs the area (local anesthetic).  A medicine that makes you fall asleep (general anesthetic).  Your health care provider will use a kind of electric scalpel that uses heat to minimize bleeding (electrocautery knife).  A curved incision (like a smile or frown) that follows the natural curve of your breast is made, to allow for minimal scarring and better healing.  The tumor will be removed with some of the surrounding tissue. This will be sent to the lab for analysis. Your health care provider may also remove your lymph nodes at this time if needed.  Sometimes, but not always, a rubber tube called a drain will be surgically inserted into your breast area or  armpit to collect excess fluid that may accumulate in the space where the tumor was. This drain is connected to a plastic bulb on the outside of your body. This drain creates suction to help remove the fluid.  The incisions will be closed with stitches (sutures).  A bandage may be placed over the incisions. AFTER THE PROCEDURE  You will be taken to the recovery area.  You will be given medicine for pain.  A small rubber drain may be placed in the breast for 2-3 days to prevent a collection of blood (hematoma)  from developing in the breast. You will be given instructions on caring for the drain before you go home.  A pressure bandage (dressing) will be applied for 1-2 days to prevent bleeding. Ask your health care provider how to care for your bandage at home.   This information is not intended to replace advice given to you by your health care provider. Make sure you discuss any questions you have with your health care provider.   Document Released: 05/11/2006 Document Revised: 04/20/2014 Document Reviewed: 09/02/2012 Elsevier Interactive Patient Education [employer].

## 2020-06-06 NOTE — Assessment & Plan Note (Addendum)
#  T1N0 [left breast upper quadrant x2 lesions-a] 5 mm lesion b] calcification s/p biopsy x2] stage I breast cancer-ER positive PR negative HER-2 negative.  Both lesions are histologically similar; both ER positive PR negative HER-2 negative.  Discussed with Dr. Reuel Derby.  # I had a long discussion with the patient and husband in general regarding the treatment options of breast cancer including-surgery; adjuvant radiation; role of adjuvant systemic therapy including-chemotherapy antihormone therapy.   # Patient will likely need lumpectomy with sentinel lymph node evaluation; followed by radiation.  Defer to Dr. Celine Ahr regarding MRI for further evaluation.  Discussed with Dr. Celine Ahr.  #I discussed that decision regarding chemotherapy based on final surgical pathology/gene assay. Patient will benefit from antihormone therapy. I discussed the potential benefits of each option; and also potential downsides in detail.  ## Genetics: Discussed with the patient majority of breast cancers are sporadic however 10 to 20% at risk of genetic/hereditary cancer syndromes.  With family history of ovarian cancer sister-I think is reasonable to do genetic testing.  Discussed importance of genetic counseling/genetic testing.  Patient interested in genetic counseling.  We will make a referral.  # DISPOSITION: # rererral to genetics- Breast cancer # follow up TBD- Dr.B  Thank you Dr. Ellison Hughs for allowing me to participate in the care of your pleasant patient. Please do not hesitate to contact me with questions or concerns in the interim.  Discussed with Vita Barley, nurse navigator.

## 2020-06-11 ENCOUNTER — Ambulatory Visit
Admission: RE | Admit: 2020-06-11 | Discharge: 2020-06-11 | Disposition: A | Discharge status: Home / Self Care | Payer: Medicare HMO | Source: Ambulatory Visit | Attending: General Surgery | Admitting: General Surgery

## 2020-06-11 ENCOUNTER — Other Ambulatory Visit: Payer: Self-pay

## 2020-06-11 DIAGNOSIS — Z170 Estrogen receptor positive status [ER+]: Secondary | ICD-10-CM | POA: Diagnosis present

## 2020-06-11 DIAGNOSIS — C50412 Malignant neoplasm of upper-outer quadrant of left female breast: Secondary | ICD-10-CM | POA: Diagnosis not present

## 2020-06-11 IMAGING — MR MR BREAST BILAT WO/W CM
3 of 10 series · 12 of 48 positions shown · IV contrast (gadavist)
Comparison: Recent mammography

CLINICAL DATA: Recently diagnosed DCIS and invasive ductal
carcinoma in the left breast. Evaluate extent of disease.

LABS:  None
EXAM:
BILATERAL BREAST MRI WITH AND WITHOUT CONTRAST
TECHNIQUE: Multiplanar, multisequence MR images of both breasts were obtained
prior to and following the intravenous administration of 6 ml of
Gadavist

[Series 2: T1 · axial · B · 1.5mm · 1.02mm/px · z∈[-67,+100]mm · 6 of 112 slices shown]
[im 1/112]
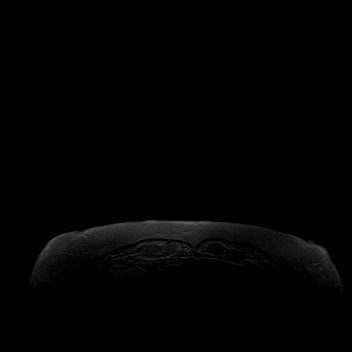
[im 23/112]
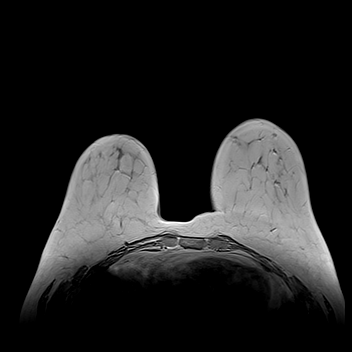
[im 45/112]
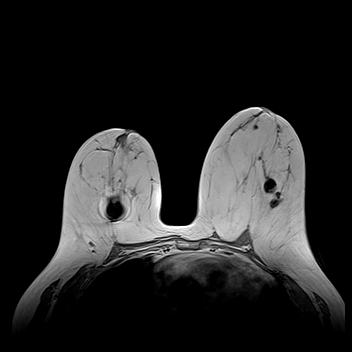
[im 67/112]
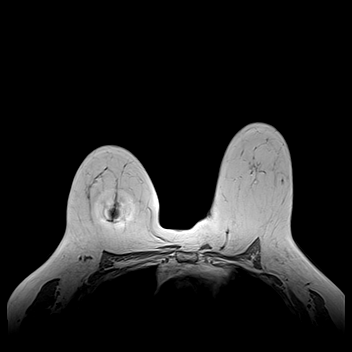
[im 89/112]
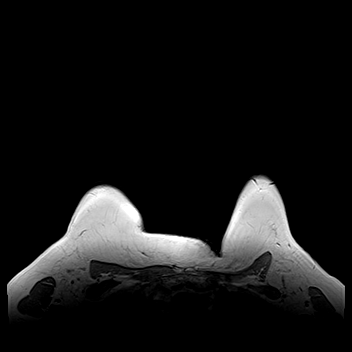
[im 112/112]
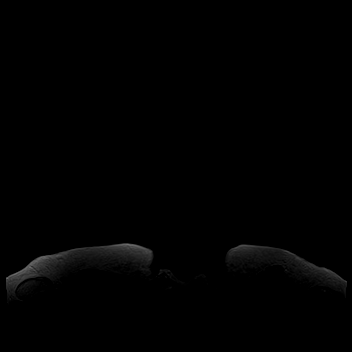

[Series 3: T2 · axial · B · 3.0mm · 1.02mm/px · z∈[-64,+98]mm · 3 of 46 slices shown (1 of 2)]
[im 1/46]
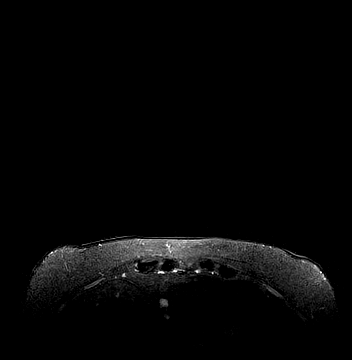
[im 23/46]
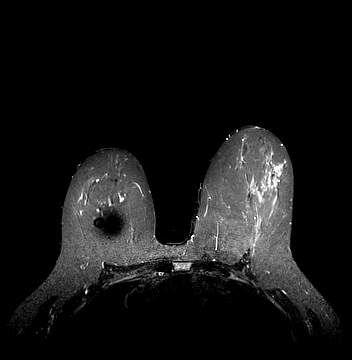
[im 46/46]
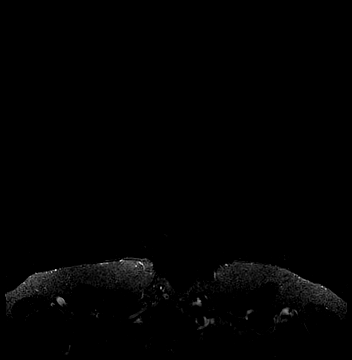

[Series 5: T2 · axial · B · 3.0mm · 1.02mm/px · z∈[-64,+98]mm · 3 of 45 slices shown (2 of 2)]
[im 1/45]
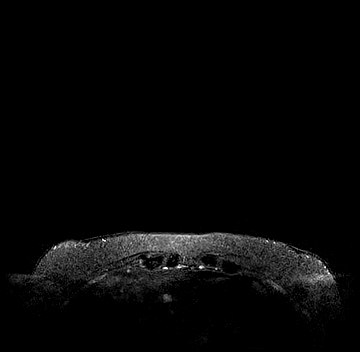
[im 23/45]
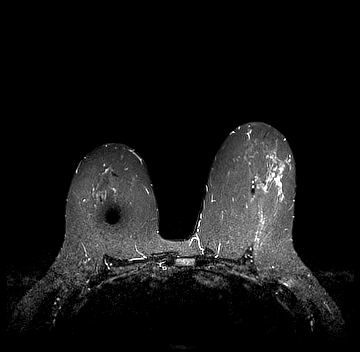
[im 45/45]
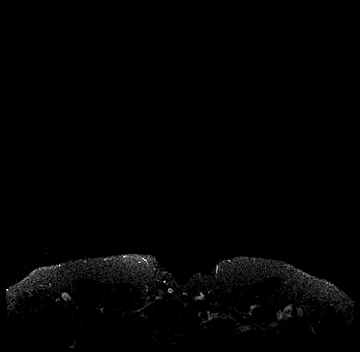

[12 of 48 positions shown; findings below may reference images not displayed]

Three-dimensional MR images were rendered by post-processing of the
original MR data on an independent workstation. The
three-dimensional MR images were interpreted, and findings are
reported in the following complete MRI report for this study. Three
dimensional images were evaluated at the independent interpreting
workstation using the DynaCAD thin client.
FINDINGS: Breast composition: b. Scattered fibroglandular tissue.

Background parenchymal enhancement: Moderate.

Right breast: Artifact is seen in the central right breast from a
surgical clip seen mammographically. This limits evaluation around
the clip. Multiple enhancing foci are seen in the right breast
limiting evaluation for small malignancies. However, no dominant or
suspicious masses are identified in the right breast. No suspicious
non mass enhancement.

Left breast: The 3 biopsy clips recently left in the left breast are
identified. There is significant non mass enhancement extending
anterior and superior to these clips. The total span of non mass
enhancement is up to 7.6 cm as measured on series 13, image 48 and 7
cm as measured on series 13, image 50. There is also linear non mass
enhancement seen just inferior to the biopsy clips spanning up to
3.8 cm as seen on series 17, image 29. Multiple small enhancing foci
are seen throughout the left breast. No other suspicious findings.

Lymph nodes: No abnormal appearing lymph nodes.

Ancillary findings:  None.
IMPRESSION: 1. No MRI evidence of malignancy in the right breast.
2. Non mass enhancement is seen extending anterior and superior to
the recent biopsy sites of known malignancy. The anterior posterior
extent of this enhancement is between 7 and 7.6 cm as seen on series
13, image 48 and series 13, image 50. There is also less pronounced
non mass enhancement extending anterior and inferior from the biopsy
clip spanning up to 3.8 cm seen on series 17, image 29.
3. No adenopathy.

RECOMMENDATION:
If breast conservation is being considered on the left, recommend
MRI biopsy of the anterior extent of non mass enhancement extending
anterior and superior to the site of known malignancy AND MRI biopsy
of the anterior extent of the non mass enhancement extending
anterior and inferior from the known site of malignancy.

BI-RADS CATEGORY  4: Suspicious.

## 2020-06-11 MED ORDER — GADOBUTROL 1 MMOL/ML IV SOLN
6.0000 mL | Freq: Once | INTRAVENOUS | Status: AC | PRN
  Administered 2020-06-11: 6 mL via INTRAVENOUS

## 2020-06-14 ENCOUNTER — Other Ambulatory Visit: Payer: Self-pay | Admitting: General Surgery

## 2020-06-14 ENCOUNTER — Telehealth: Payer: Self-pay

## 2020-06-14 DIAGNOSIS — R9389 Abnormal findings on diagnostic imaging of other specified body structures: Secondary | ICD-10-CM

## 2020-06-14 NOTE — Telephone Encounter (Signed)
Called and spoke with the patient and let her know that we received a message from Dr Celine Ahr. She said that the Radiologist who reviewed her breast MRI recommends two MRI guided biopsies of the left breast if she wants to consider breast conservation. She is in agreement with this.  She is scheduled for the biopsies on 06/19/20 at 8:10 am arrival at [place] at [facility]. She is to have nothing to eat or drink for 4 hours prior. She may take her needed regular medications with a sip of water that morning. She may drive herself. If she has someone with her they will need to remain in the car. This will take a couple of hours.

## 2020-06-19 ENCOUNTER — Other Ambulatory Visit: Payer: Self-pay

## 2020-06-19 ENCOUNTER — Ambulatory Visit
Admission: RE | Admit: 2020-06-19 | Discharge: 2020-06-19 | Disposition: A | Discharge status: Home / Self Care | Payer: Medicare HMO | Source: Ambulatory Visit | Attending: General Surgery | Admitting: General Surgery

## 2020-06-19 ENCOUNTER — Other Ambulatory Visit: Payer: Self-pay | Admitting: Radiology

## 2020-06-19 DIAGNOSIS — R9389 Abnormal findings on diagnostic imaging of other specified body structures: Secondary | ICD-10-CM

## 2020-06-19 IMAGING — MR MR BREAST BX W LOC DEV 1ST LESION IMAGE BX SPEC MR GUIDE*L*
7 of 10 series · 30 of 48 positions shown · IV contrast (7 ml gadavist)
Comparison: Previous exams.
COMPARISON: Previous exams.

Addendum:
CLINICAL DATA: Recently diagnosed DCIS and invasive ductal
carcinoma in the left breast. A recent MRI to evaluate extent of
disease demonstrated abnormal enhancement extending anteriorly and
superiorly to the known malignancy and anteriorly and inferiorly to
the known malignancy. The patient presents for biopsy of the
anterior extent of both regions.

EXAM:
MRI GUIDED CORE NEEDLE BIOPSY OF THE LEFT BREAST
TECHNIQUE: Multiplanar, multisequence MR imaging of the left breast was
performed both before and after administration of intravenous
contrast.
CONTRAST:  7mL GADAVIST GADOBUTROL 1 MMOL/ML IV SOLN

[Series 2: fiducial unilateral · sagittal · 2.0mm · 1.33mm/px · 3 of 52 slices shown]
[im 1/52]
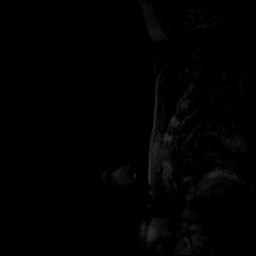
[im 26/52]
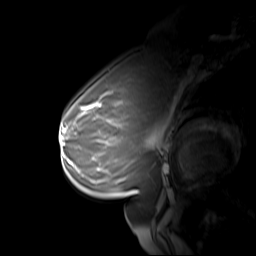
[im 52/52]
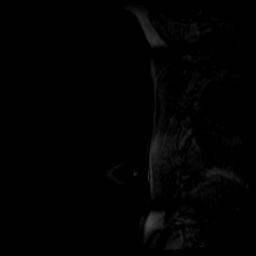

[Series 3: dynamic pre · axial · non-contrast · 1.3mm · 0.73mm/px · z∈[-61,+125]mm · 5 of 144 slices shown]
[im 1/144]
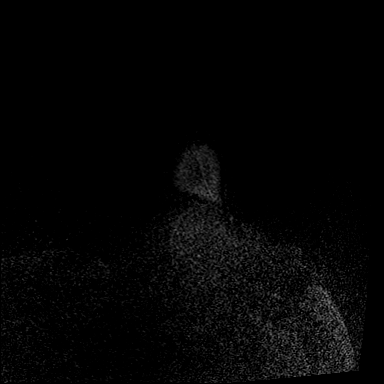
[im 36/144]
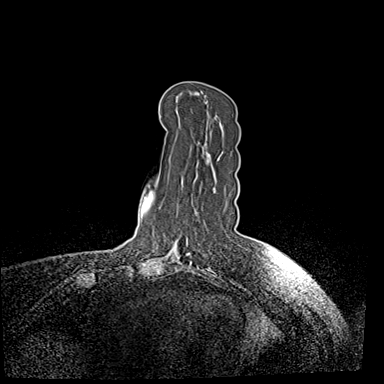
[im 72/144]
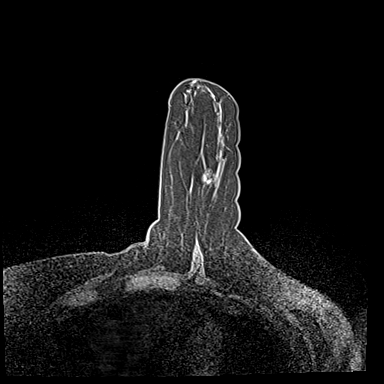
[im 108/144]
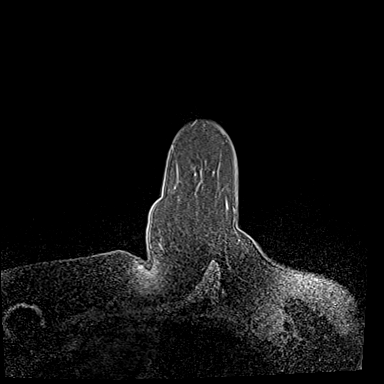
[im 144/144]
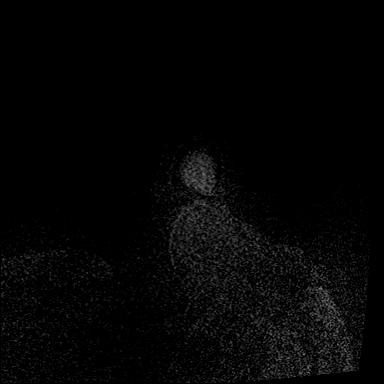

[Series 4: dynamic post 20 · axial · 1.3mm · 0.73mm/px · z∈[-61,+125]mm · 5 of 144 slices shown (1 of 2)]
[im 1/144]
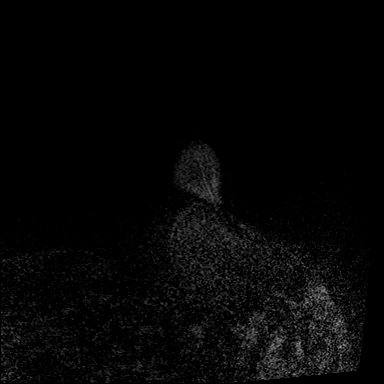
[im 36/144]
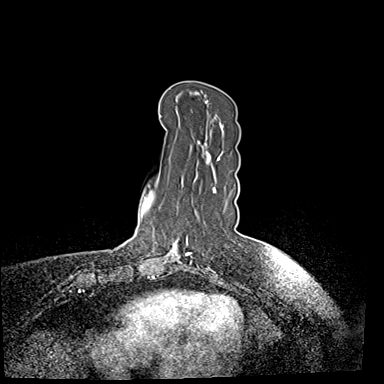
[im 72/144]
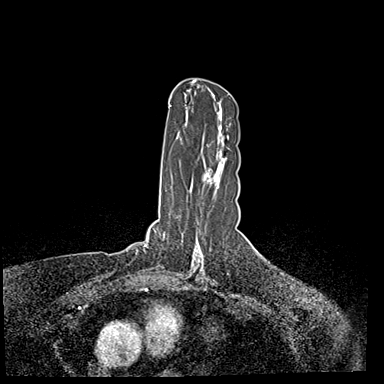
[im 108/144]
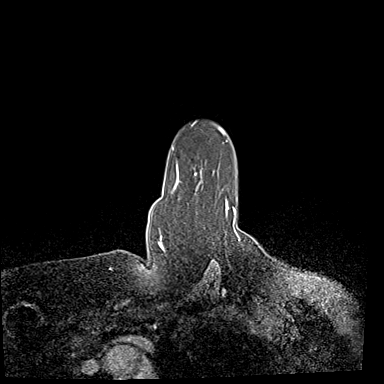
[im 144/144]
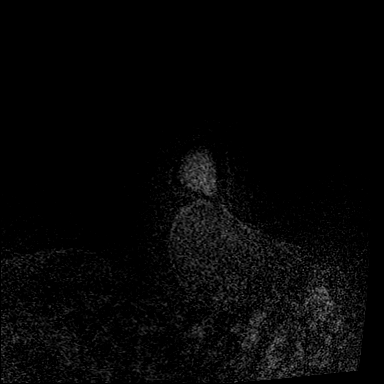

[Series 5: dynamic post 20 · axial · 1.3mm · 0.73mm/px · z∈[-61,+125]mm · 5 of 144 slices shown (2 of 2)]
[im 1/144]
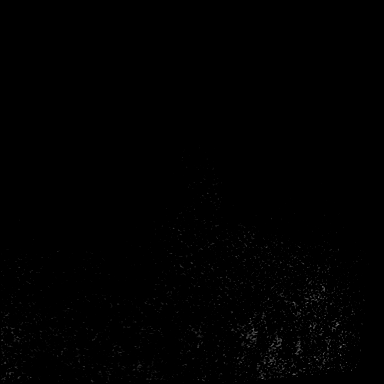
[im 36/144]
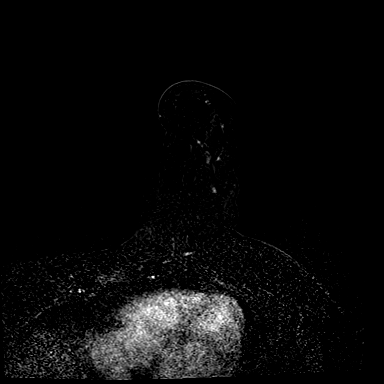
[im 72/144]
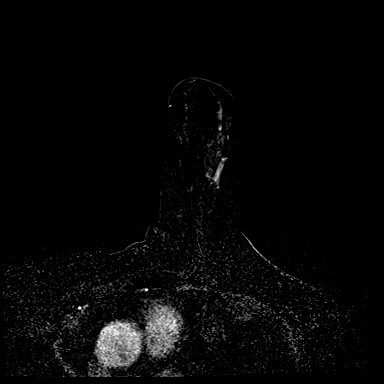
[im 108/144]
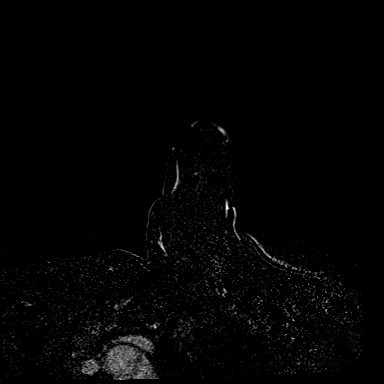
[im 144/144]
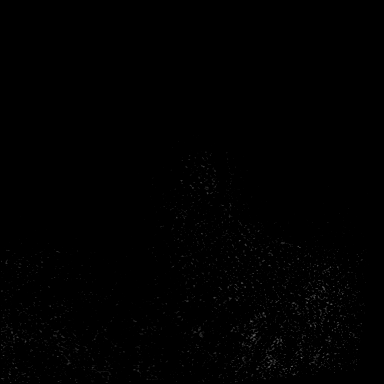

[Series 6: dynamic post 3 · axial · 1.3mm · 0.73mm/px · z∈[-61,+125]mm · 5 of 144 slices shown (1 of 2)]
[im 1/144]
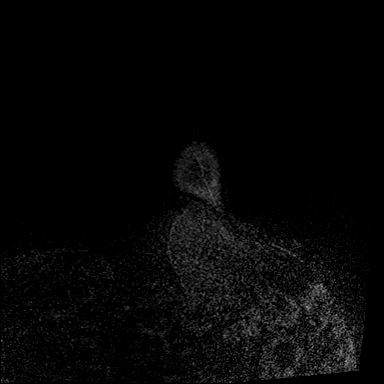
[im 36/144]
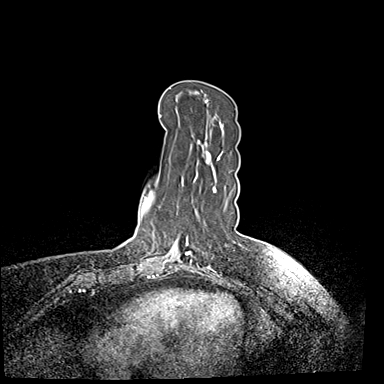
[im 72/144]
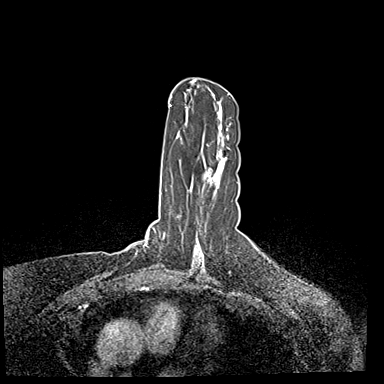
[im 108/144]
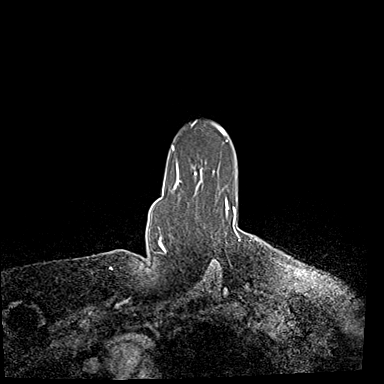
[im 144/144]
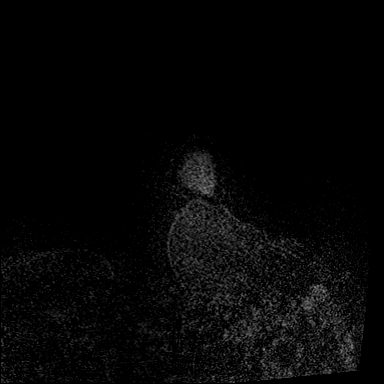

[Series 7: dynamic post 3 · axial · 1.3mm · 0.73mm/px · z∈[-61,+125]mm · 5 of 144 slices shown (2 of 2)]
[im 1/144]
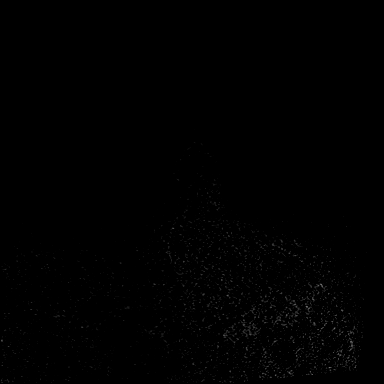
[im 36/144]
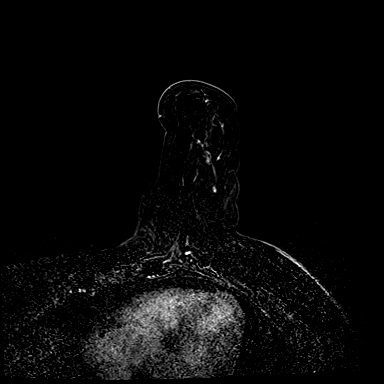
[im 72/144]
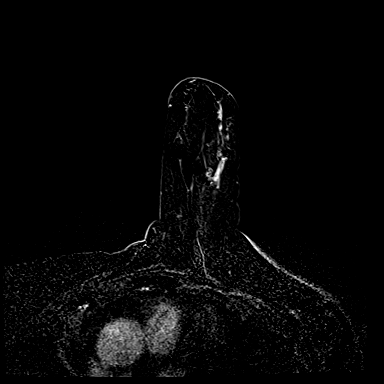
[im 108/144]
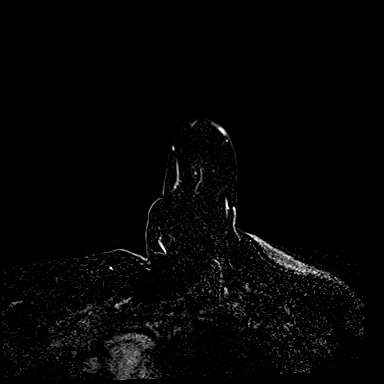
[im 144/144]
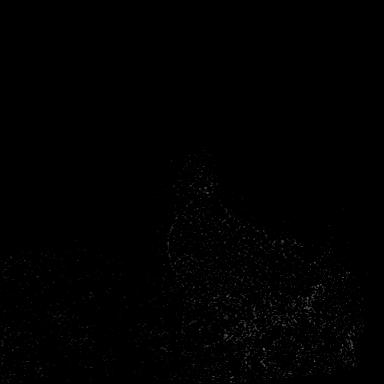

[Series 8: dynamic post 7min · axial · 1.3mm · 0.73mm/px · z∈[-61,-15]mm · 2 of 144 slices shown]
[im 1/144]
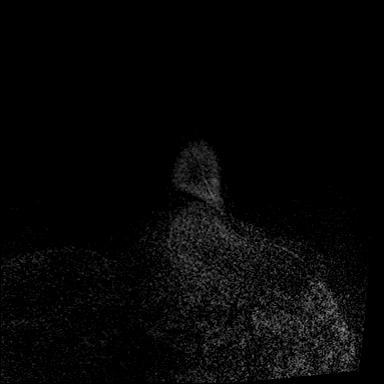
[im 36/144]
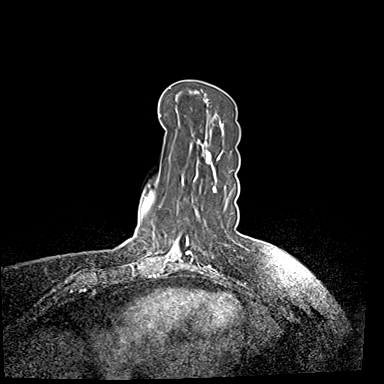

[30 of 48 positions shown; findings below may reference images not displayed]

FINDINGS: I met with the patient, and we discussed the procedure of MRI guided
biopsy, including risks, benefits, and alternatives. Specifically,
we discussed the risks of infection, bleeding, tissue injury, clip
migration, and inadequate sampling. Informed, written consent was
given. The usual time out protocol was performed immediately prior
to the procedure.

The abnormal enhancement extending anterior and superior to the
known malignancy was identified today and biopsied.

The enhancement extending anterior and inferior to the known
malignancy on the recent MRI was not visualized today. As a result,
we simply biopsied the most posterior extent of enhancement to most
accurately assess extent of disease.

Using sterile technique, 1% Lidocaine, MRI guidance, and a 9 gauge
vacuum assisted device, biopsy was performed of enhancement
extending anteriorly and superiorly from the known malignancy using
a lateral approach. At the conclusion of the procedure, a tissue
marker clip was deployed into the biopsy cavity. Follow-up 2-view
mammogram was performed and dictated separately.

Using sterile technique, 1% Lidocaine, MRI guidance, and a 9 gauge
vacuum assisted device, biopsy was performed of the posterior most
extent of enhancement relative to the known malignancy using a
lateral approach. At the conclusion of the procedure, a tissue
marker clip was deployed into the biopsy cavity. Follow-up 2-view
mammogram was performed and dictated separately.
IMPRESSION: MRI guided biopsy of the anterior aspect of the abnormal enhancement
extending anterior and superior to the known malignancy. MRI guided
biopsy was also obtained of the most posterior extent of abnormal
enhancement relative to the known malignancy. No apparent
complications.

ADDENDUM:
Pathology revealed ATYPICAL DUCTAL HYPERPLASIA INVOLVING SCLEROSING
ADENOSIS of the LEFT breast, anterior. The size/extent of ADH
bordering on low-grade DCIS. This was found to be concordant by Dr.
NAHODIL, with excision recommended. There was confluent
enhancement between this biopsy and the posterior biopsy performed
today. The MRI findings are highly suspicious for DCIS/malignancy
between this anterior biopsy and the more posterior biopsy.

Pathology revealed HIGH-GRADE DUCTAL CARCINOMA IN SITU of the LEFT
breast, posterior. This was found to be concordant by Dr. NAHODIL
NAHODIL.

Pathology results were discussed with the patient by telephone. The
patient reported doing well after the biopsies with tenderness and
bruising at the sites. Post biopsy instructions and care were
reviewed and questions were answered. The patient was encouraged to
call The [REDACTED] for any additional
concerns.

The patient has a recent diagnosis of left breast cancer and should
follow her outlined treatment plan.

Pathology results reported by NAHODIL RN on [DATE].

*** End of Addendum ***
FINDINGS: I met with the patient, and we discussed the procedure of MRI guided
biopsy, including risks, benefits, and alternatives. Specifically,
we discussed the risks of infection, bleeding, tissue injury, clip
migration, and inadequate sampling. Informed, written consent was
given. The usual time out protocol was performed immediately prior
to the procedure.

The abnormal enhancement extending anterior and superior to the
known malignancy was identified today and biopsied.

The enhancement extending anterior and inferior to the known
malignancy on the recent MRI was not visualized today. As a result,
we simply biopsied the most posterior extent of enhancement to most
accurately assess extent of disease.

Using sterile technique, 1% Lidocaine, MRI guidance, and a 9 gauge
vacuum assisted device, biopsy was performed of enhancement
extending anteriorly and superiorly from the known malignancy using
a lateral approach. At the conclusion of the procedure, a tissue
marker clip was deployed into the biopsy cavity. Follow-up 2-view
mammogram was performed and dictated separately.

Using sterile technique, 1% Lidocaine, MRI guidance, and a 9 gauge
vacuum assisted device, biopsy was performed of the posterior most
extent of enhancement relative to the known malignancy using a
lateral approach. At the conclusion of the procedure, a tissue
marker clip was deployed into the biopsy cavity. Follow-up 2-view
mammogram was performed and dictated separately.
IMPRESSION: MRI guided biopsy of the anterior aspect of the abnormal enhancement
extending anterior and superior to the known malignancy. MRI guided
biopsy was also obtained of the most posterior extent of abnormal
enhancement relative to the known malignancy. No apparent
complications.

## 2020-06-19 IMAGING — MG MM BREAST LOCALIZATION CLIP
4 series · 4 of 12 positions shown · non-contrast
Comparison: Previous exam(s).

CLINICAL DATA: Evaluation of biopsy markers

EXAM:
DIAGNOSTIC LEFT MAMMOGRAM POST MRI BIOPSY

[L CC synth-2D]
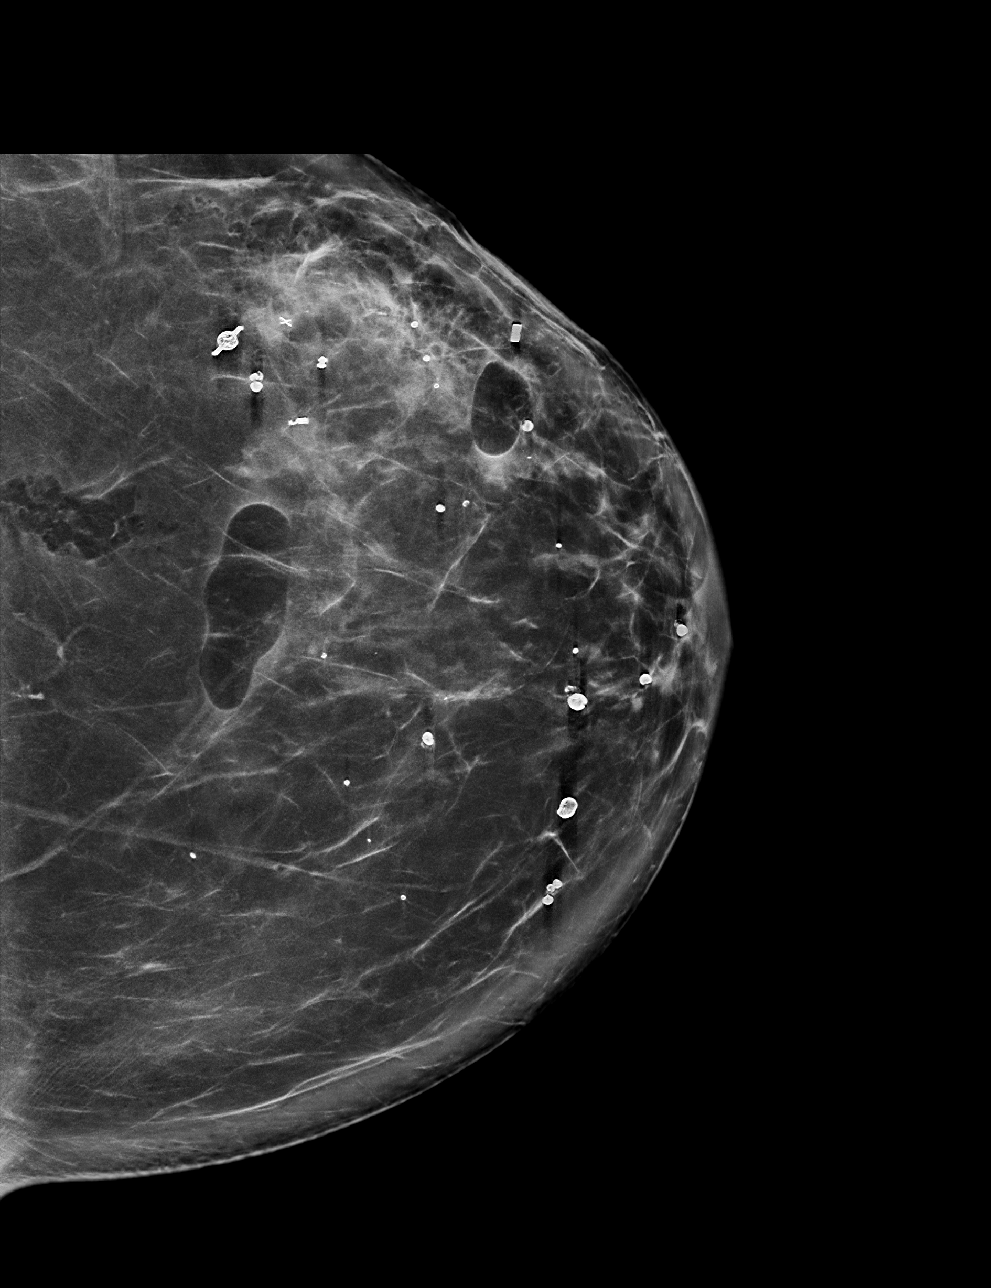

[L ML synth-2D]
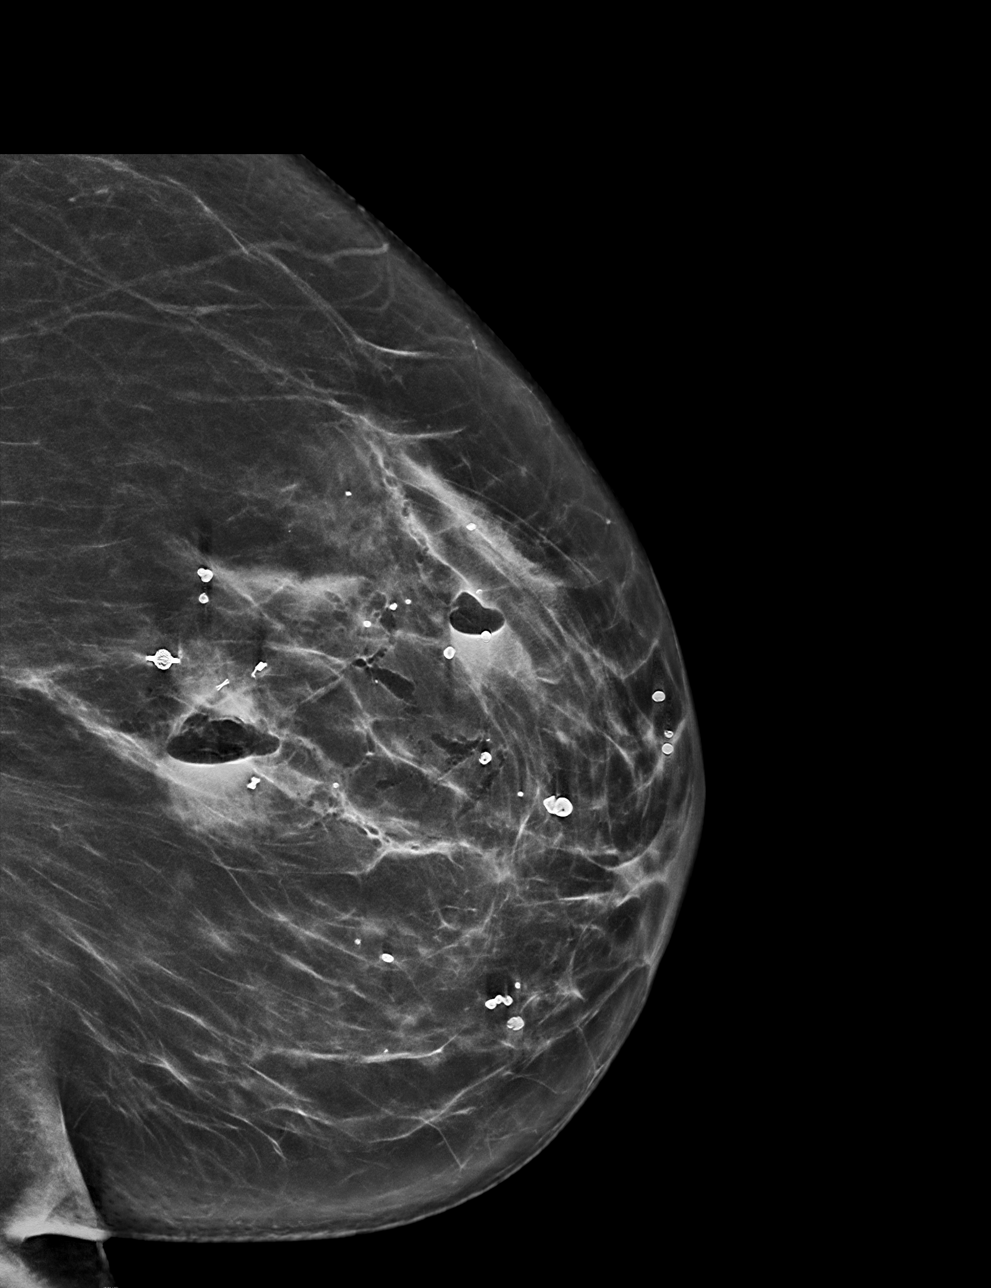

[L CC tomo · tomo slice 41/80.0]
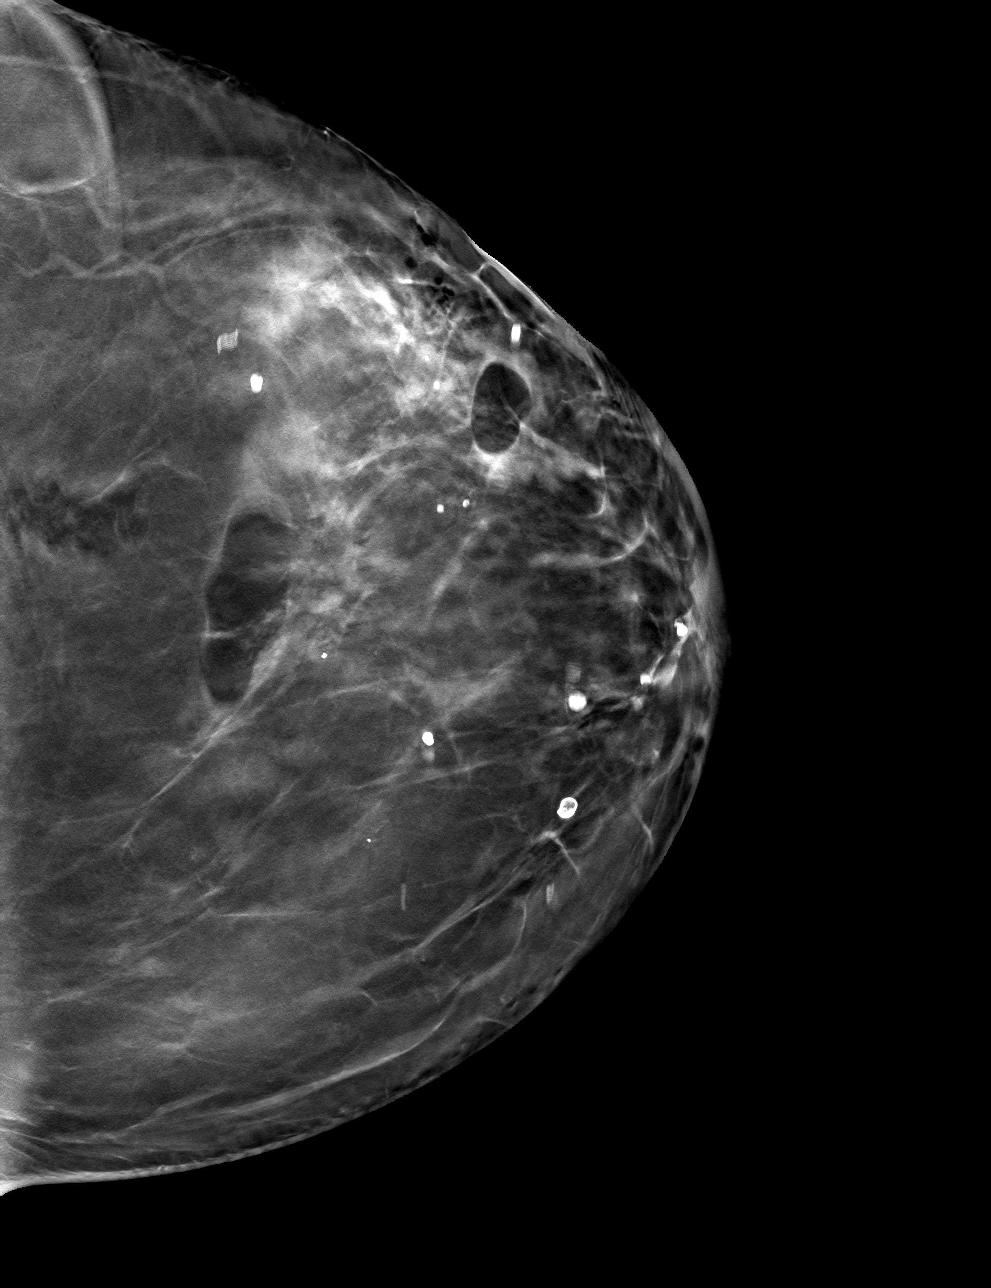

[L ML tomo · tomo slice 35/70.0]
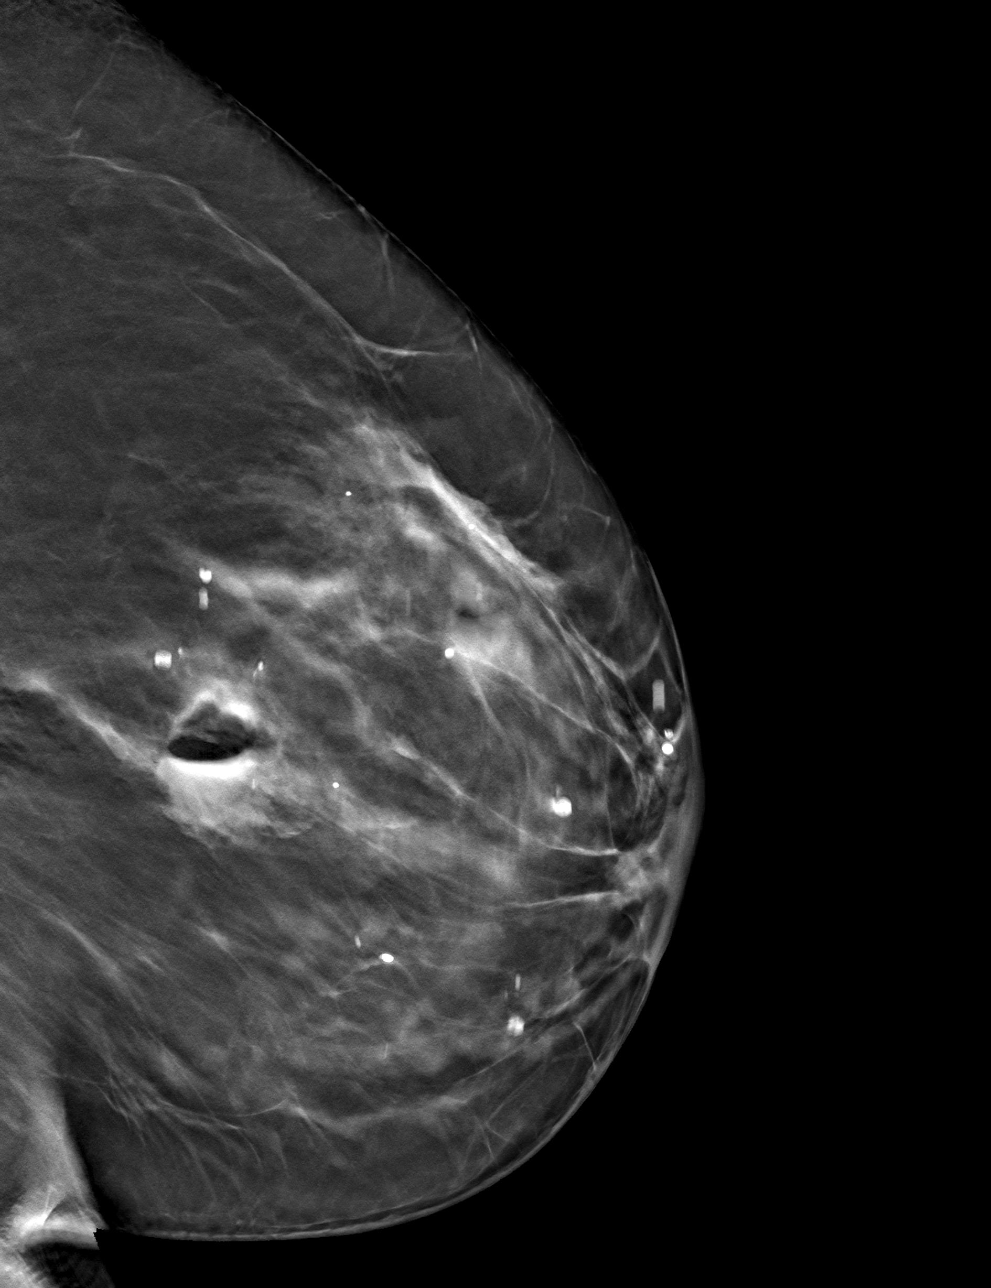

[4 of 12 positions shown; findings below may reference images not displayed]

FINDINGS: Mammographic images were obtained following MRI guided biopsy of 2
regions of non mass enhancement. The cylinder shaped clip was placed
at the site of the anterior-most biopsy. The dog bone shaped clip
was placed at the posteroinferior most extent of enhancement.
IMPRESSION: The cylinder shaped clip is in good position at the site of the
anterior-most biopsy today. The dog bone shaped clip is at the
posteroinferior most extent of abnormal enhancement biopsied today.
On the 90 degree lateral, it is 5.7 cm between the posterior aspect
of the vision clip marking a known malignancy and the anterior-most
biopsy performed today.

It should be noted there was wispy ill-defined abnormal enhancement
which extended 1.2 cm anterior today's anterior-most biopsy. There
is thought to be between 7 and 7.6 cm of abnormality worrisome for
malignancy on the most recent MRI.

Final Assessment: Post Procedure Mammograms for Marker Placement

## 2020-06-19 MED ORDER — GADOBUTROL 1 MMOL/ML IV SOLN
7.0000 mL | Freq: Once | INTRAVENOUS | Status: AC | PRN
  Administered 2020-06-19: 7 mL via INTRAVENOUS

## 2020-07-01 ENCOUNTER — Encounter: Payer: Self-pay | Admitting: General Surgery

## 2020-07-02 ENCOUNTER — Other Ambulatory Visit: Payer: Self-pay

## 2020-07-02 ENCOUNTER — Inpatient Hospital Stay: Payer: Medicare HMO | Attending: Internal Medicine | Admitting: Licensed Clinical Social Worker

## 2020-07-02 ENCOUNTER — Encounter: Payer: Self-pay | Admitting: Licensed Clinical Social Worker

## 2020-07-02 ENCOUNTER — Inpatient Hospital Stay: Payer: Medicare HMO

## 2020-07-02 ENCOUNTER — Ambulatory Visit: Payer: Medicare HMO | Admitting: General Surgery

## 2020-07-02 ENCOUNTER — Encounter: Payer: Self-pay | Admitting: General Surgery

## 2020-07-02 VITALS — BP 138/84 | HR 56 | Temp 97.7°F | Ht 64.0 in | Wt 156.4 lb

## 2020-07-02 DIAGNOSIS — C50412 Malignant neoplasm of upper-outer quadrant of left female breast: Secondary | ICD-10-CM

## 2020-07-02 DIAGNOSIS — Z170 Estrogen receptor positive status [ER+]: Secondary | ICD-10-CM

## 2020-07-02 DIAGNOSIS — Z803 Family history of malignant neoplasm of breast: Secondary | ICD-10-CM | POA: Diagnosis not present

## 2020-07-02 DIAGNOSIS — Z8041 Family history of malignant neoplasm of ovary: Secondary | ICD-10-CM

## 2020-07-02 NOTE — H&P (View-Only) (Signed)
Candice Bowers is here today to discuss the results of her MRI and additional biopsies.  She is a 65 year old woman whom I initially saw on June 06, 2020.  I have copied my initial HPI here:  "She is here today to discuss surgery for newly diagnosed left breast cancer.  The area of concern was discovered on routine screening mammogram.  She subsequently underwent biopsy of 3 separate areas.  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  These were ER positive, PR and HER-2 negative.  She has a history of prior right breast biopsy 20 years ago that was negative for malignant pathology.  She has used both birth control and hormone replacement therapy in the past.  She underwent menopause at the age of 22.  She denies any family history of breast cancer.  Menarche was at the age of 35.  She had 1 pregnancy at the age of 34 and did breast-feed the child.  She did not palpate a lump or mass.  She has not experienced any skin changes or nipple discharge.  She denies any breast pain.  She reports that she does not perform monthly self exams.  She met with Dr. Rogue Bowers earlier today."  At that visit, we established that she was interested in breast conservation therapy.  Because there were 2 separate areas of malignancy, I wanted to understand the spatial relationships between the tumor sites prior to commencing with surgical intervention.  I ordered an MRI to help in this process.  This was performed on June 11, 2020.  Based upon the imaging, additional biopsies were recommended.  This was done on March 9.  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 This was found to be concordant by Dr. Dorise Bowers, with excision recommended. There was confluent enhancement between this biopsy and the  posterior biopsy performed today. The MRI findings are highly suspicious for DCIS/malignancy between this anterior biopsy and the more posterior biopsy.  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 This was found to be concordant by Dr. Dorise Bowers."        Past Medical History:  Diagnosis Date  . Carcinoma of upper-outer quadrant of left breast in female, estrogen receptor positive ([place]) 06/06/2020  . Family history of breast cancer   . Family history of ovarian cancer   . Hyperlipidemia    Past Surgical History:  Procedure Laterality Date  . BREAST BIOPSY Left 05/29/2020   affirm bx "X" clp-path pending  . BREAST BIOPSY Left 05/29/2020   Affirm bx"Coil" clip-path pending  . BREAST BIOPSY Right 2001   excisional biopsy  . BREAST EXCISIONAL BIOPSY Right 06/19/1999   neg  . TUBAL LIGATION     Family History  Problem Relation Age of Onset  . Ovarian cancer Sister 29  . Breast cancer Maternal Grandmother        dx 57s  . Breast cancer Paternal Grandmother    Social History   Tobacco Use  . Smoking status: Former Smoker    Years: 20.00    Types: Cigarettes  . Smokeless tobacco: Never Used  Vaping Use  . Vaping Use: Never used  Substance Use Topics  . Alcohol use: Never  . Drug use: Never   Current Outpatient Medications on File Prior to Visit  Medication Sig Dispense Refill  .  Aspirin-Acetaminophen-Caffeine (EXCEDRIN MIGRAINE PO) Take 1 tablet by mouth as needed.    Marland Kitchen atorvastatin (LIPITOR) 10 MG tablet Take 1 tablet by mouth daily.    . Cholecalciferol 25 MCG (1000 UT) tablet Take 1 tablet by mouth daily.    . Omega-3 Fatty Acids (FISH OIL) 1000 MG CAPS Take 1 capsule by mouth daily.    Marland Kitchen omeprazole (PRILOSEC) 20 MG capsule Take 1 capsule by mouth daily.    . valsartan (DIOVAN) 80 MG tablet Take 1 tablet by mouth daily.     No current facility-administered medications on file prior to visit.   No Known  Allergies  Candice Bowers, her husband, and I spent 30 minutes discussing the implications of the biopsy findings.  She is still very interested in breast conservation.  I cautioned her that the amount of tissue that would be required to try and obtain clear margins would likely leave her with a significant asymmetry or defect.  I discussed the possibility of local tissue rearrangement at the time of surgery, which I certainly will attempt, if it is feasible.  We also discussed that future correction of the asymmetry could be addressed by plastic surgery, should she feel that the cosmetic outcome from her cancer operation is undesirable.  Greater than 50% of my time spent with the patient today was dedicated to counseling and coordination of her care.  In fact, this was nearly 100% of the time spent today.  As she wishes to proceed with breast conservation surgery, we will arrange for her to undergo RF ID-localized lumpectomy and sentinel lymph node biopsy.  The risks of these procedures were discussed with her.  These include, but are not limited to, bleeding, infection, hematoma formation, seroma formation, undesirable cosmetic outcome, positive margins, need for additional surgeries or procedures, lymphedema, allergic reaction to blue dye, skin necrosis from blue dye.  I also let her know that she would likely have some temporary skin discoloration as well as blue urine following the procedure.  We will work on getting her scheduled.

## 2020-07-02 NOTE — Patient Instructions (Signed)
Our surgery scheduler Pamala Hurry will contact you in the next 24- 48 hours. When speaking with her please have your blue surgery sheet available. Please contact us if you have any questions or concerns.   Lumpectomy  A lumpectomy, sometimes called a partial mastectomy, is surgery to remove a cancerous tumor or mass (the lump) from a breast. It is a form of breast-conserving or breast-preservation surgery. This means that the cancerous tissue is removed but the breast remains intact. During a lumpectomy, the portion of the breast that contains the tumor is removed. Some normal tissue around the lump may be taken out to make sure that all of the tumor has been removed. Lymph nodes under your arm may also be removed and tested to find out if the cancer has spread. Lymph nodes are part of the body's disease-fighting system (immune system) and are usually the first place where breast cancer spreads. Tell a health care provider about:  Any allergies you have.  All medicines you are taking, including vitamins, herbs, eye drops, creams, and over-the-counter medicines.  Any problems you or family members have had with anesthetic medicines.  Any blood disorders you have.  Any surgeries you have had.  Any medical conditions you have.  Whether you are pregnant or may be pregnant. What are the risks? Generally, this is a safe procedure. However, problems may occur, including:  Bleeding.  Infection.  Allergic reaction to medicines.  Pain, swelling, weakness, or numbness in the arm on the side of your surgery.  Temporary swelling.  Change in the shape of the breast, particularly if a large portion is removed.  Scar tissue that forms at the surgical site and feels hard to the touch.  Blood clots. What happens before the procedure? Staying hydrated Follow instructions from your health care provider about hydration, which may include:  Up to 2 hours before the procedure - you may continue to  drink clear liquids, such as water, clear fruit juice, black coffee, and plain tea.   Eating and drinking restrictions Follow instructions from your health care provider about eating and drinking, which may include:  8 hours before the procedure - stop eating heavy meals or foods, such as meat, fried foods, or fatty foods.  6 hours before the procedure - stop eating light meals or foods, such as toast or cereal.  6 hours before the procedure - stop drinking milk or drinks that contain milk.  2 hours before the procedure - stop drinking clear liquids. Medicines Ask your health care provider about:  Changing or stopping your regular medicines. This is especially important if you are taking diabetes medicines or blood thinners.  Taking medicines such as aspirin and ibuprofen. These medicines can thin your blood. Do not take these medicines unless your health care provider tells you to take them.  Taking over-the-counter medicines, vitamins, herbs, and supplements. General instructions  Prior to surgery, your health care provider may do a procedure to locate and mark the tumor area in your breast (localization). This will help guide your surgeon to where the incision will be made. This may be done with: ? Imaging, such as a mammogram, ultrasound, or MRI. ? Insertion of a small wire, clip, or seed, or an implant that will reflect a radar signal.  You may have screening tests or exams to get baseline measurements of your arm. These can be compared to measurements done after surgery to monitor for swelling (lymphedema) that can develop after having lymph nodes removed.  Ask your health care provider: ? How your surgery site will be marked. ? What steps will be taken to help prevent infection. These may include:  Washing skin with a germ-killing soap.  Taking antibiotic medicine.  Plan to have someone take you home from the hospital or clinic.  Plan to have a responsible adult care for  you for at least 24 hours after you leave the hospital or clinic. This is important. What happens during the procedure?  An IV will be inserted into one of your veins.  You will be given one or more of the following: ? A medicine to help you relax (sedative). ? A medicine to numb the area (local anesthetic). ? A medicine to make you fall asleep (general anesthetic).  Your health care provider will use a kind of electric scalpel that uses heat to reduce bleeding (electrocautery knife). A curved incision that follows the natural curve of your breast will be made. This type of incision will allow for minimal scarring and better healing.  The tumor will be removed along with some of the tissue around it. This will be sent to the lab for testing. Your health care provider may also remove lymph nodes at this time if needed.  If the tumor is close to the muscles over your chest, some muscle tissue may also be removed.  A small drain tube may be inserted into your breast area or armpit to collect fluid that may build up after surgery. This tube will be connected to a suction bulb on the outside of your body to remove the fluid.  The incision will be closed with stitches (sutures).  A bandage (dressing) may be placed over the incision. The procedure may vary among health care providers and hospitals.   What happens after the procedure?  Your blood pressure, heart rate, breathing rate, and blood oxygen level will be monitored until you leave the hospital or clinic.  You will be given medicine for pain as needed.  Your IV will be removed when you are able to eat and drink by mouth.  You will be encouraged to get up and walk as soon as you can. This is important to improve blood flow and breathing. Ask for help if you feel weak or unsteady.  You may have: ? A drain tube in place for 2-3 days to prevent a collection of blood (hematoma) from developing in the breast. You will be given instructions  about caring for the drain before you go home. ? A pressure bandage applied for 1-2 days to prevent bleeding or swelling. Your pressure bandage may look like a thick piece of fabric or an elastic wrap. Ask your health care provider how to care for your bandage at home.  You may be given a tight sleeve to wear over your arm on the side of your surgery. You should wear this sleeve as told by your health care provider.  Do not drive for 24 hours if you were given a sedative during your procedure. Summary  A lumpectomy, sometimes called a partial mastectomy, is surgery to remove a cancerous tumor or mass (the lump) from a breast.  During a lumpectomy, the portion of the breast that contains the tumor is removed. Lymph nodes under your arm may also be removed and tested to find out if the cancer has spread.  Plan to have someone take you home from the hospital or clinic.  You may have a drain tube in place for 2-3  days to prevent a collection of blood (hematoma) from developing in the breast. You will be given instructions about caring for the drain before you go home. This information is not intended to replace advice given to you by your health care provider. Make sure you discuss any questions you have with your health care provider. Document Revised: 10/03/2018 Document Reviewed: 10/03/2018 Elsevier Patient Education  [employer].

## 2020-07-02 NOTE — Progress Notes (Signed)
Candice Bowers is here today to discuss the results of her MRI and additional biopsies.  She is a 65 year old woman whom I initially saw on June 06, 2020.  I have copied my initial HPI here:  "She is here today to discuss surgery for newly diagnosed left breast cancer.  The area of concern was discovered on routine screening mammogram.  She subsequently underwent biopsy of 3 separate areas.  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  These were ER positive, PR and HER-2 negative.  She has a history of prior right breast biopsy 20 years ago that was negative for malignant pathology.  She has used both birth control and hormone replacement therapy in the past.  She underwent menopause at the age of 47.  She denies any family history of breast cancer.  Menarche was at the age of 66.  She had 1 pregnancy at the age of 71 and did breast-feed the child.  She did not palpate a lump or mass.  She has not experienced any skin changes or nipple discharge.  She denies any breast pain.  She reports that she does not perform monthly self exams.  She met with Dr. Rogue Bussing earlier today."  At that visit, we established that she was interested in breast conservation therapy.  Because there were 2 separate areas of malignancy, I wanted to understand the spatial relationships between the tumor sites prior to commencing with surgical intervention.  I ordered an MRI to help in this process.  This was performed on June 11, 2020.  Based upon the imaging, additional biopsies were recommended.  This was done on March 9.  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 This was found to be concordant by Dr. Dorise Bullion, with excision recommended. There was confluent enhancement between this biopsy and the  posterior biopsy performed today. The MRI findings are highly suspicious for DCIS/malignancy between this anterior biopsy and the more posterior biopsy.  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 This was found to be concordant by Dr. Dorise Bullion."        Past Medical History:  Diagnosis Date  . Carcinoma of upper-outer quadrant of left breast in female, estrogen receptor positive ([place]) 06/06/2020  . Family history of breast cancer   . Family history of ovarian cancer   . Hyperlipidemia    Past Surgical History:  Procedure Laterality Date  . BREAST BIOPSY Left 05/29/2020   affirm bx "X" clp-path pending  . BREAST BIOPSY Left 05/29/2020   Affirm bx"Coil" clip-path pending  . BREAST BIOPSY Right 2001   excisional biopsy  . BREAST EXCISIONAL BIOPSY Right 06/19/1999   neg  . TUBAL LIGATION     Family History  Problem Relation Age of Onset  . Ovarian cancer Sister 83  . Breast cancer Maternal Grandmother        dx 9s  . Breast cancer Paternal Grandmother    Social History   Tobacco Use  . Smoking status: Former Smoker    Years: 20.00    Types: Cigarettes  . Smokeless tobacco: Never Used  Vaping Use  . Vaping Use: Never used  Substance Use Topics  . Alcohol use: Never  . Drug use: Never   Current Outpatient Medications on File Prior to Visit  Medication Sig Dispense Refill  .  Aspirin-Acetaminophen-Caffeine (EXCEDRIN MIGRAINE PO) Take 1 tablet by mouth as needed.    Marland Kitchen atorvastatin (LIPITOR) 10 MG tablet Take 1 tablet by mouth daily.    . Cholecalciferol 25 MCG (1000 UT) tablet Take 1 tablet by mouth daily.    . Omega-3 Fatty Acids (FISH OIL) 1000 MG CAPS Take 1 capsule by mouth daily.    Marland Kitchen omeprazole (PRILOSEC) 20 MG capsule Take 1 capsule by mouth daily.    . valsartan (DIOVAN) 80 MG tablet Take 1 tablet by mouth daily.     No current facility-administered medications on file prior to visit.   No Known  Allergies  Ms. Laguardia, her husband, and I spent 30 minutes discussing the implications of the biopsy findings.  She is still very interested in breast conservation.  I cautioned her that the amount of tissue that would be required to try and obtain clear margins would likely leave her with a significant asymmetry or defect.  I discussed the possibility of local tissue rearrangement at the time of surgery, which I certainly will attempt, if it is feasible.  We also discussed that future correction of the asymmetry could be addressed by plastic surgery, should she feel that the cosmetic outcome from her cancer operation is undesirable.  Greater than 50% of my time spent with the patient today was dedicated to counseling and coordination of her care.  In fact, this was nearly 100% of the time spent today.  As she wishes to proceed with breast conservation surgery, we will arrange for her to undergo RF ID-localized lumpectomy and sentinel lymph node biopsy.  The risks of these procedures were discussed with her.  These include, but are not limited to, bleeding, infection, hematoma formation, seroma formation, undesirable cosmetic outcome, positive margins, need for additional surgeries or procedures, lymphedema, allergic reaction to blue dye, skin necrosis from blue dye.  I also let her know that she would likely have some temporary skin discoloration as well as blue urine following the procedure.  We will work on getting her scheduled.

## 2020-07-02 NOTE — Progress Notes (Signed)
REFERRING PROVIDER: Cammie Sickle, MD [place],  [place] [address]  PRIMARY PROVIDER:  Sofie Hartigan, MD  PRIMARY REASON FOR VISIT:  1. Carcinoma of upper-outer quadrant of left breast in female, estrogen receptor positive ([place])   2. Family history of breast cancer   3. Family history of ovarian cancer      HISTORY OF PRESENT ILLNESS:   Candice Bowers, a 65 y.o. female, was seen for a [facility] cancer genetics consultation at the request of Dr. Rogue Bussing due to a personal and family history of cancer.  Candice Bowers presents to clinic today to discuss the possibility of a hereditary predisposition to cancer, genetic testing, and to further clarify her future cancer risks, as well as potential cancer risks for family members.   In 2022, at the age of 71, Candice Bowers was diagnosed with invasive mammary carcinoma of the left breast, ER+, PR-, HER2- The treatment plan includes lumpectomy, possible chemotherapy and antihormone therapy. Patient feels good about current surgical plan and does not feel she would change her decision based on any genetic test results.   CANCER HISTORY:  Oncology History Overview Note  # JAN 2022-  0.5 cm irregular UPPER-OUTER LEFT breast mass with calcifications extending 1.5 cm anteroinferiorly, suspicious for malignancy. Ultrasound-guided biopsy of the mass is recommended. Stereotactic guided biopsy of the anterior/inferior most calcifications extending from this mass is also recommended. 2. Indeterminate 0.4 cm group of UPPER-OUTER LEFT breast calcifications, located 2.5 cm from the irregular mass above. 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 This was found to be concordant by Dr. Zerita Boers.   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 This was found to be concordant by Dr. Zerita Boers.   Pathology revealed COLUMNAR CELL CHANGE WITH ASSOCIATED CALCIFICATIONS, NEGATIVE FOR ATYPIA AND MALIGNANCY of the LEFT breast calcifications, medial upper outer quadrant, coil clip. This was found to be concordant by Dr. Zerita Boers.   Of note, the area of invasive mammary carcinoma spans at least 1.9 cm mammographically.  # BREAST, LEFT 2:00 7 CM FN; ULTRASOUND-GUIDED BIOPSY (VISION CLIP):  - INVASIVE MAMMARY CARCINOMA, NO SPECIAL TYPE.  Size of invasive carcinoma: 5 mm in this sample  Histologic grade of invasive carcinoma: Grade 3                       Glandular/tubular differentiation score: 3                       Nuclear pleomorphism score: 3                       Mitotic rate score: 2                       Total score: 8  Ductal carcinoma in situ: Present, high-grade with calcification  Lymphovascular invasion: Not identified   B.  BREAST WITH CALCIFICATIONS, LEFT LATERAL UPPER OUTER QUADRANT;  STEREOTACTIC BIOPSY (X SHAPED CLIP):  - INVASIVE MAMMARY CARCINOMA, NO SPECIAL TYPE.  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  -  CALCIFICATIONS ASSOCIATED WITH DCIS AND SCLEROSING ADENOSIS.  - AREAS SUSPICIOUS FOR LYMPHOVASCULAR INVASION.  Size of invasive carcinoma: 4 mm in this sample  Histologic grade of invasive carcinoma: Grade 3                       Glandular/tubular differentiation score: 3                       Nuclear pleomorphism score: 3                       Mitotic rate score: 2                       Total score: 8   C.  BREAST WITH CALCIFICATIONS, LEFT MEDIAL UPPER OUTER QUADRANT;  STEREOTACTIC BIOPSY (COIL-SHAPED CLIP):  -  COLUMNAR CELL CHANGE WITH ASSOCIATED CALCIFICATIONS.  - NEGATIVE FOR ATYPIA AND MALIGNANCY. BIOPSY- ER > 90%; PR-NEG; Her-2 NEG   Carcinoma of upper-outer quadrant of left breast in female, estrogen receptor positive ([place])  06/06/2020 Initial Diagnosis   Carcinoma of upper-outer quadrant of left breast in female, estrogen receptor positive ([place])   06/06/2020 Cancer Staging   Staging form: Breast, AJCC 8th Edition - Clinical: Stage IB (cT1b, cN0, cM0, G3, ER+, PR-, HER2-) - Signed by Cammie Sickle, MD on 06/06/2020 Stage prefix: Initial diagnosis      RISK FACTORS:  Menarche was at age 81.  First live birth at age 74.  OCP use for approximately 10 years.  Ovaries intact: yes.  Hysterectomy: no.  Menopausal status: postmenopausal.  HRT use: 10 years. Colonoscopy: yes; normal. Mammogram within the last year: yes. Number of breast biopsies: 2. Up to date with pelvic exams: yes. Any excessive radiation exposure in the past: no  Past Medical History:  Diagnosis Date  . Carcinoma of upper-outer quadrant of left breast in female, estrogen receptor positive ([place]) 06/06/2020  . Family history of breast cancer   . Family history of ovarian cancer   . Hyperlipidemia     Past Surgical History:  Procedure Laterality Date  . BREAST BIOPSY Left 05/29/2020   affirm bx "X" clp-path pending  . BREAST BIOPSY Left 05/29/2020   Affirm bx"Coil" clip-path pending  . BREAST BIOPSY Right 2001   excisional biopsy  . BREAST EXCISIONAL BIOPSY Right 06/19/1999   neg  . TUBAL LIGATION      Social History   Socioeconomic History  . Marital status: Married    Spouse name: Not on file  . Number of children: Not on file  . Years of education: Not on file  . Highest education level: Not on file  Occupational History  . Not on file  Tobacco Use  . Smoking status: Former Smoker    Years: 20.00    Types: Cigarettes  . Smokeless tobacco: Never Used  Vaping Use  . Vaping Use: Never used   Substance and Sexual Activity  . Alcohol use: Never  . Drug use: Never  . Sexual activity: Not on file  Other Topics Concern  . Not on file  Social History Narrative  . Not on file   Social Determinants of Health   Financial Resource Strain: Not on file  Food Insecurity: Not on file  Transportation Needs: Not on file  Physical Activity: Not on file  Stress: Not on file  Social Connections: Not on file     FAMILY HISTORY:  We obtained a detailed, 4-generation family history.  Significant diagnoses are listed below: Family History  Problem Relation Age of Onset  . Ovarian cancer Sister 37  . Breast cancer Maternal Grandmother        dx 69s  . Breast cancer Paternal Grandmother    Candice Bowers has 1 daughter, no cancers, no grandchildren. She had 3 sisters and 1 brother. One sister had ovarian cancer and died at 69.   Candice Bowers mother died at 83, no cancers. Patient's maternal grandmother had breast cancer in her 62s and died in her 34s. No other known cancers on this side.  Candice Bowers father died in a car accident in his 56s. Paternal grandmother had breast cancer, unknown age of diagnosis, and died in her 78s. No other known cancers on this side of the family.  Ms. Saxton is unaware  of previous family history of genetic testing for hereditary cancer risks. Patient's maternal ancestors are of unknown descent, and paternal ancestors are of English/unknown descent. There is no reported Ashkenazi Jewish ancestry. There is no known consanguinity.     GENETIC COUNSELING ASSESSMENT: Candice Bowers is a 65 y.o. female with a personal and family history of cancer which is somewhat suggestive of a hereditary cancer syndrome and predisposition to cancer. We, therefore, discussed and recommended the following at today's visit.   DISCUSSION: We discussed that approximately 5-10% of breast cancer is hereditary  Most cases of hereditary breast cancer are associated with BRCA1/BRCA2 genes,  although there are other genes associated with hereditary breast and ovarian cancer as well .  We discussed that testing is beneficial for several reasons including surgical decision-making for breast cancer, knowing about other cancer risks, identifying potential screening and risk-reduction options that may be appropriate, and to understand if other family members could be at risk for cancer and allow them to undergo genetic testing.   We reviewed the characteristics, features and inheritance patterns of hereditary cancer syndromes. We also discussed genetic testing, including the appropriate family members to test, the process of testing, insurance coverage and turn-around-time for results. We discussed the implications of a negative, positive and/or variant of uncertain significant result. We recommended Candice Bowers pursue genetic testing for the [facility] Multi-Cancer+RNA gene panel.   The Multi-Cancer Panel + RNA offered by [facility] includes sequencing and/or deletion duplication testing of the following 84 genes: AIP, ALK, APC, ATM, AXIN2,BAP1,  BARD1, BLM, BMPR1A, BRCA1, BRCA2, BRIP1, CASR, CDC73, CDH1, CDK4, CDKN1B, CDKN1C, CDKN2A (p14ARF), CDKN2A (p16INK4a), CEBPA, CHEK2, CTNNA1, DICER1, DIS3L2, EGFR (c.2369C>T, p.Thr790Met variant only), EPCAM (Deletion/duplication testing only), FH, FLCN, GATA2, GPC3, GREM1 (Promoter region deletion/duplication testing only), HOXB13 (c.251G>A, p.Gly84Glu), HRAS, KIT, MAX, MEN1, MET, MITF (c.952G>A, p.Glu318Lys variant only), MLH1, MSH2, MSH3, MSH6, MUTYH, NBN, NF1, NF2, NTHL1, PALB2, PDGFRA, PHOX2B, PMS2, POLD1, POLE, POT1, PRKAR1A, PTCH1, PTEN, RAD50, RAD51C, RAD51D, RB1, RECQL4, RET, RUNX1, SDHAF2, SDHA (sequence changes only), SDHB, SDHC, SDHD, SMAD4, SMARCA4, SMARCB1, SMARCE1, STK11, SUFU, TERC, TERT, TMEM127, TP53, TSC1, TSC2, VHL, WRN and WT1.  Based on Candice Bowers's personal and family history of cancer, she meets medical criteria for genetic testing. Despite that  she meets criteria, she may still have an out of pocket cost. We discussed that if her out of pocket cost for testing is over $100, the laboratory will call and confirm whether she wants to proceed with testing.  If the out of pocket cost of testing is less than $100 she will be billed by the genetic testing laboratory.  etics  clinic in anticipation of new discoveries regarding hereditary cancer genetic testing.    PLAN: After considering the risks, benefits, and limitations,  Candice Bowers did not wish to pursue genetic testing at today's visit. She would like to think about this first, and does not wish to change her surgical decision based on testing. We understand this decision and remain available to coordinate genetic testing at any time in the future. We, therefore, recommend Candice Bowers continue to follow the cancer screening guidelines given by her primary healthcare provider.  Candice Bowers questions were answered to her satisfaction today. Our contact information was provided should additional questions or concerns arise. Thank you for the referral and allowing [place] to share in the care of your patient.   Candice Rogue, MS, [facility] Genetic Counselor [email or web address] Phone: [phone]  The patient was seen for a total of 30 minutes in face-to-face genetic counseling.  Dr. Grayland Ormond was available for discussion regarding this case.   _______________________________________________________________________ For Office Staff:  Number of people involved in session: 1 Was an Intern/ student involved with case: no

## 2020-07-03 ENCOUNTER — Other Ambulatory Visit: Payer: Self-pay | Admitting: General Surgery

## 2020-07-03 DIAGNOSIS — C50412 Malignant neoplasm of upper-outer quadrant of left female breast: Secondary | ICD-10-CM

## 2020-07-03 DIAGNOSIS — Z170 Estrogen receptor positive status [ER+]: Secondary | ICD-10-CM

## 2020-07-04 ENCOUNTER — Telehealth: Payer: Self-pay | Admitting: General Surgery

## 2020-07-04 NOTE — Telephone Encounter (Signed)
Patient has been advised of Pre-Admission date/time, COVID Testing date and Surgery date.  Surgery Date: 07/12/20, patient to arrive @ 8:30 am as will be having SLN bx done first prior to surgery same day.    Preadmission Testing Date: 07/08/20 (phone 1p-5p) Covid Testing Date: 07/10/20 in person @ 8:35 am Patient also reminded of her RF tags to be placed on 07/10/20 at the [facility].  She will get her Covid test done after these are placed.   Patient advised to go to [facility] for [employer] testing.

## 2020-07-08 ENCOUNTER — Encounter
Admission: RE | Admit: 2020-07-08 | Discharge: 2020-07-08 | Disposition: A | Discharge status: Home / Self Care | Payer: Medicare HMO | Source: Ambulatory Visit | Attending: General Surgery | Admitting: General Surgery

## 2020-07-08 ENCOUNTER — Other Ambulatory Visit: Payer: Self-pay

## 2020-07-08 HISTORY — DX: Gastro-esophageal reflux disease without esophagitis: K21.9

## 2020-07-08 HISTORY — DX: Personal history of other diseases of the digestive system: Z87.19

## 2020-07-08 HISTORY — DX: Type 2 diabetes mellitus without complications: E11.9

## 2020-07-08 HISTORY — DX: Essential (primary) hypertension: I10

## 2020-07-08 HISTORY — DX: Migraine, unspecified, not intractable, without status migrainosus: G43.909

## 2020-07-08 NOTE — Patient Instructions (Addendum)
Your procedure is scheduled on: Friday, April 1 Report to the Registration Desk on the 1st floor of the [facility] at the designated time you were given.   REMEMBER: Instructions that are not followed completely may result in serious medical risk, up to and including death; or upon the discretion of your surgeon and anesthesiologist your surgery may need to be rescheduled.  Do not eat food after midnight the night before surgery.  No gum chewing, lozengers or hard candies.  You may however, drink water up to 2 hours before you are scheduled to arrive for your surgery. Do not drink anything within 2 hours of your scheduled arrival time.  TAKE THESE MEDICATIONS THE MORNING OF SURGERY WITH A SIP OF WATER:  1.  Omeprazole (Prilosec)  (take one the night before and one on the morning of surgery - helps to prevent nausea after surgery.)  One week prior to surgery: starting today, March 28 Stop Anti-inflammatories (NSAIDS) such as Advil, Aleve, Ibuprofen, Motrin, Naproxen, Naprosyn and Aspirin based products such as Excedrin, Goodys Powder, BC Powder. Stop ANY OVER THE COUNTER supplements until after surgery. (Fish Oil)  No Alcohol for 24 hours before or after surgery.  No Smoking including e-cigarettes for 24 hours prior to surgery.  No chewable tobacco products for at least 6 hours prior to surgery.  No nicotine patches on the day of surgery.  Do not use any "recreational" drugs for at least a week prior to your surgery.  Please be advised that the combination of cocaine and anesthesia may have negative outcomes, up to and including death. If you test positive for cocaine, your surgery will be cancelled.  On the morning of surgery brush your teeth with toothpaste and water, you may rinse your mouth with mouthwash if you wish. Do not swallow any toothpaste or mouthwash.  Do not wear jewelry, make-up, hairpins, clips or nail polish.  Do not wear lotions, powders, or perfumes.   Do  not shave body from the neck down 48 hours prior to surgery just in case you cut yourself which could leave a site for infection.  Also, freshly shaved skin may become irritated if using the CHG soap.  Contact lenses, hearing aids and dentures may not be worn into surgery.  Do not bring valuables to the hospital. [facility] is not responsible for any missing/lost belongings or valuables.   Use CHG Soap as directed on instruction sheet.  Notify your doctor if there is any change in your medical condition (cold, fever, infection).  Wear comfortable clothing (specific to your surgery type) to the hospital.  Plan for stool softeners for home use; pain medications have a tendency to cause constipation. You can also help prevent constipation by eating foods high in fiber such as fruits and vegetables and drinking plenty of fluids as your diet allows.  After surgery, you can help prevent lung complications by doing breathing exercises.  Take deep breaths and cough every 1-2 hours. Your doctor may order a device called an Incentive Spirometer to help you take deep breaths.  If you are being discharged the day of surgery, you will not be allowed to drive home. You will need a responsible adult (18 years or older) to drive you home and stay with you that night.   If you are taking public transportation, you will need to have a responsible adult (18 years or older) with you. Please confirm with your physician that it is acceptable to use public transportation.  Please call the [facility] Dept. at [phone] if you have any questions about these instructions.  Surgery Visitation Policy:  Patients undergoing a surgery or procedure may have one family member or support person with them as long as that person is not COVID-19 positive or experiencing its symptoms.  That person may remain in the waiting area during the procedure.

## 2020-07-10 ENCOUNTER — Other Ambulatory Visit: Payer: Medicare HMO

## 2020-07-10 ENCOUNTER — Ambulatory Visit
Admission: RE | Admit: 2020-07-10 | Discharge: 2020-07-10 | Disposition: A | Discharge status: Home / Self Care | Payer: Medicare HMO | Source: Ambulatory Visit | Attending: General Surgery | Admitting: General Surgery

## 2020-07-10 ENCOUNTER — Other Ambulatory Visit: Payer: Self-pay

## 2020-07-10 ENCOUNTER — Other Ambulatory Visit
Admission: RE | Admit: 2020-07-10 | Discharge: 2020-07-10 | Disposition: A | Discharge status: Home / Self Care | Payer: Medicare HMO | Source: Ambulatory Visit | Attending: General Surgery | Admitting: General Surgery

## 2020-07-10 DIAGNOSIS — Z20822 Contact with and (suspected) exposure to covid-19: Secondary | ICD-10-CM | POA: Diagnosis not present

## 2020-07-10 DIAGNOSIS — C50412 Malignant neoplasm of upper-outer quadrant of left female breast: Secondary | ICD-10-CM | POA: Insufficient documentation

## 2020-07-10 DIAGNOSIS — E118 Type 2 diabetes mellitus with unspecified complications: Secondary | ICD-10-CM | POA: Diagnosis not present

## 2020-07-10 DIAGNOSIS — I10 Essential (primary) hypertension: Secondary | ICD-10-CM | POA: Insufficient documentation

## 2020-07-10 DIAGNOSIS — Z1231 Encounter for screening mammogram for malignant neoplasm of breast: Secondary | ICD-10-CM | POA: Diagnosis not present

## 2020-07-10 DIAGNOSIS — Z01818 Encounter for other preprocedural examination: Secondary | ICD-10-CM | POA: Insufficient documentation

## 2020-07-10 DIAGNOSIS — Z170 Estrogen receptor positive status [ER+]: Secondary | ICD-10-CM | POA: Insufficient documentation

## 2020-07-10 LAB — SARS CORONAVIRUS 2 (TAT 6-24 HRS): SARS Coronavirus 2: NEGATIVE

## 2020-07-10 IMAGING — MG MM PLC BREAST LOC DEV 1ST LESION INC MAMMO GUIDE*L*
8 of 9 series · 8 of 9 positions shown · non-contrast
Comparison: Previous exam(s)

CLINICAL DATA: Here for three-site RF localization prior to breast
conserving surgery. The patient has recently diagnosed invasive
mammary carcinoma and ductal carcinoma in situ of the left breast as
well as an area of atypical ductal hyperplasia on MR guided biopsy.

EXAM:
MAMMOGRAPHIC GUIDED RADIOFREQUENCY DEVICE, THREE SITES
LOCALIZATION OF THE LEFT BREAST

[L LM (1 of 5)]
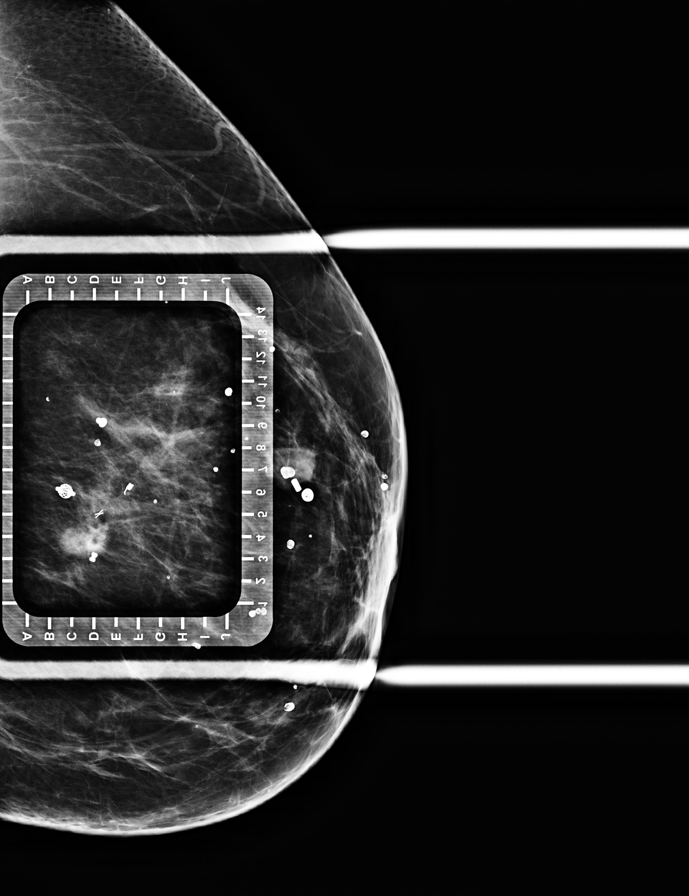

[L CC (1 of 3)]
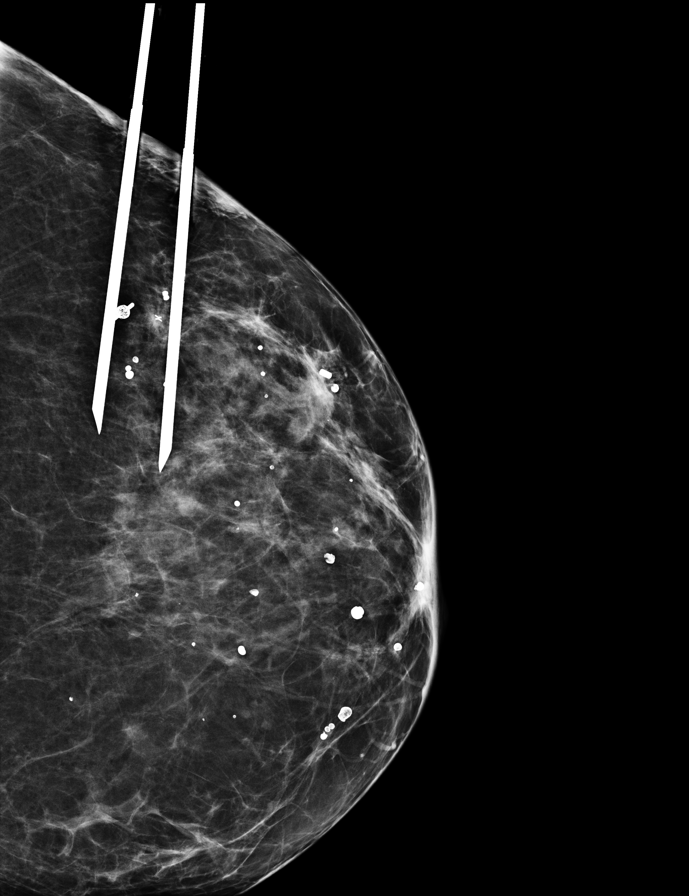

[L LM (2 of 5)]
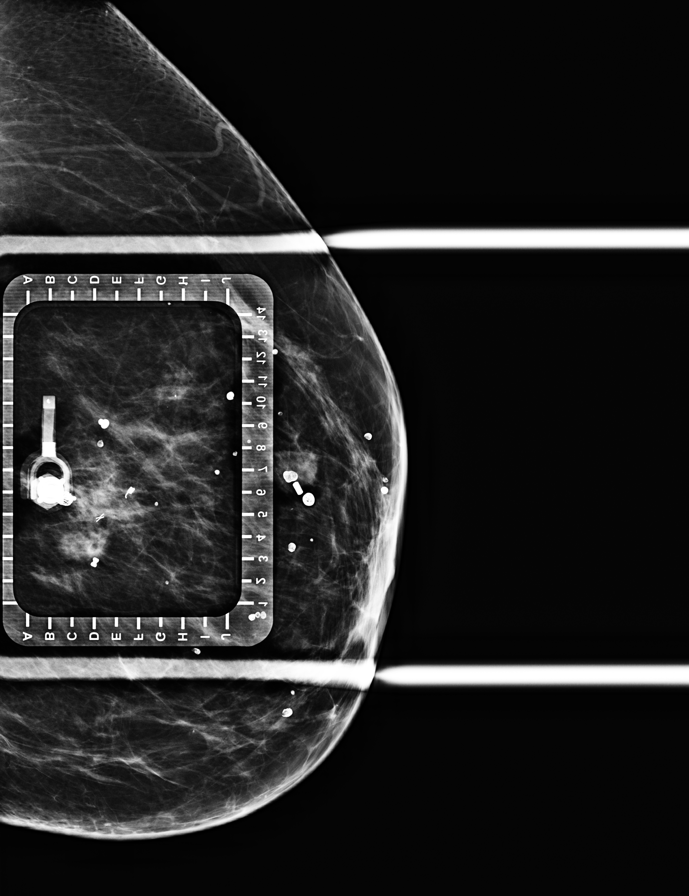

[L LM (3 of 5)]
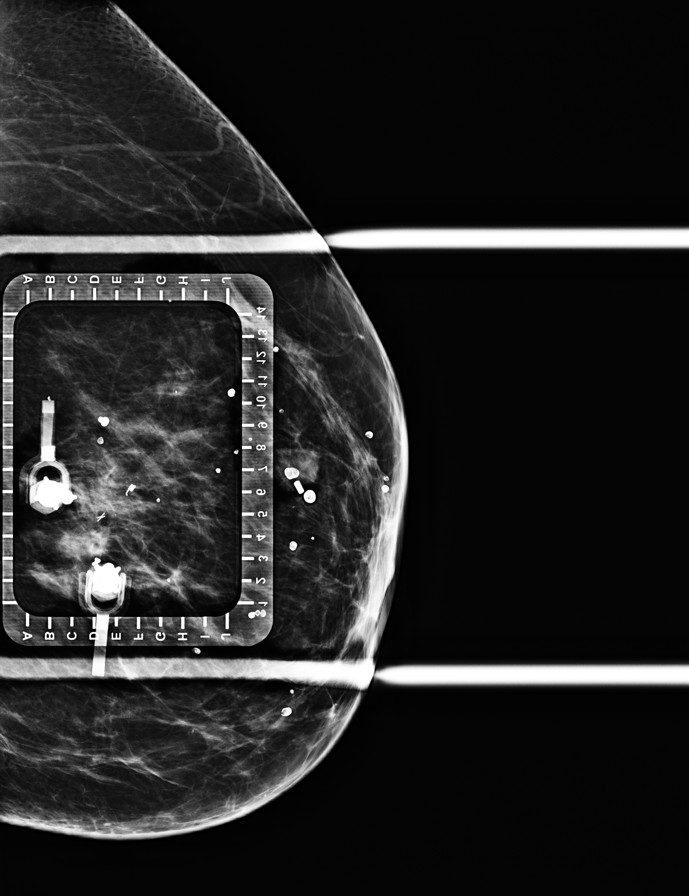

[L CC (2 of 3)]
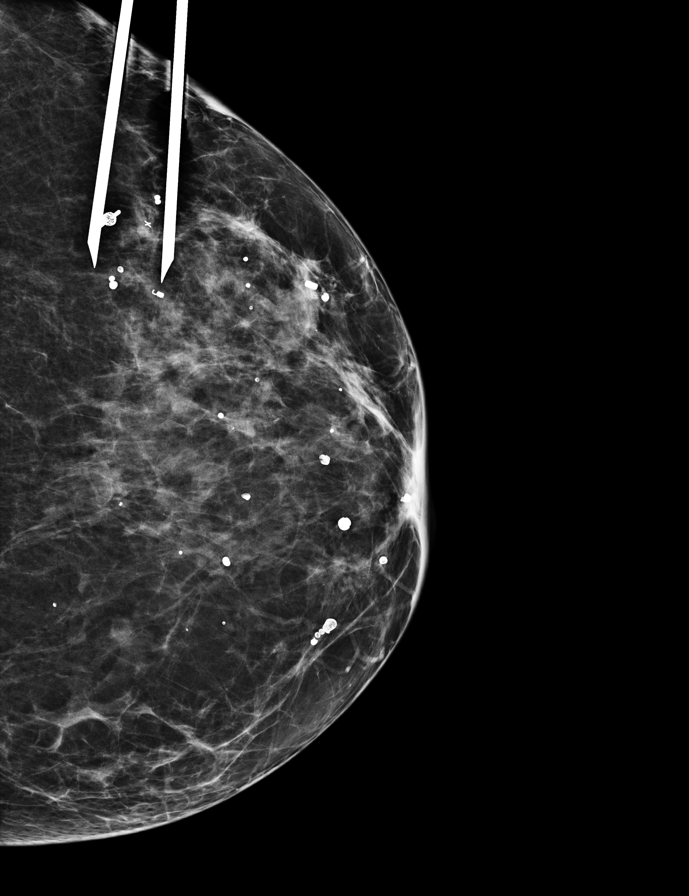

[L LM (4 of 5)]
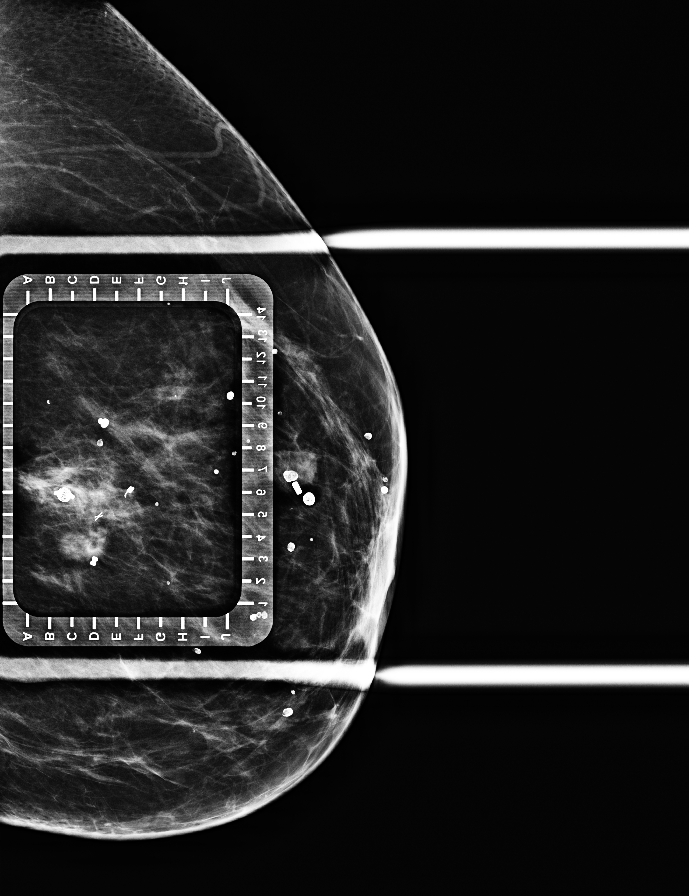

[L CC (3 of 3)]
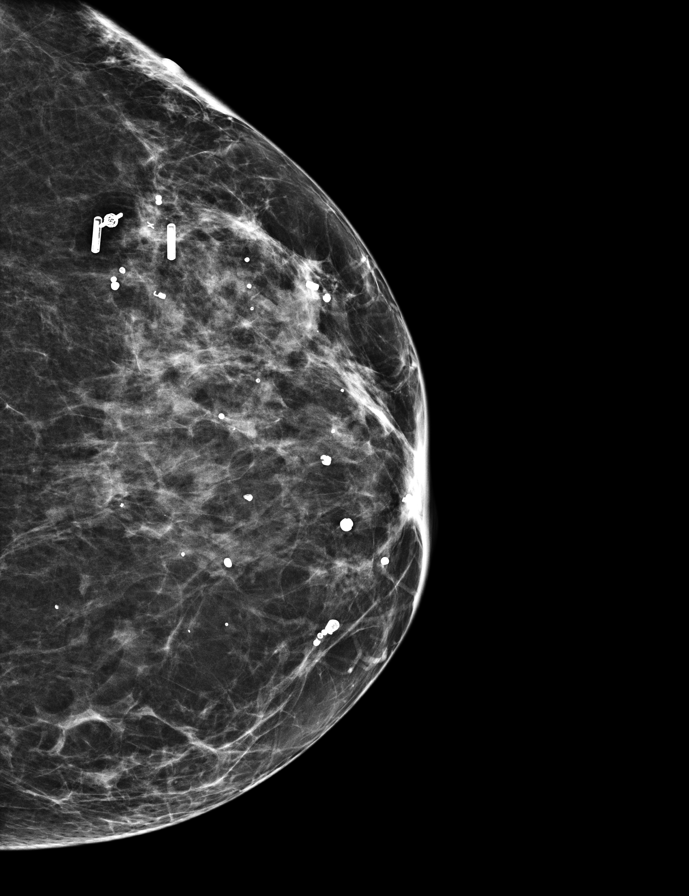

[L LM (5 of 5)]
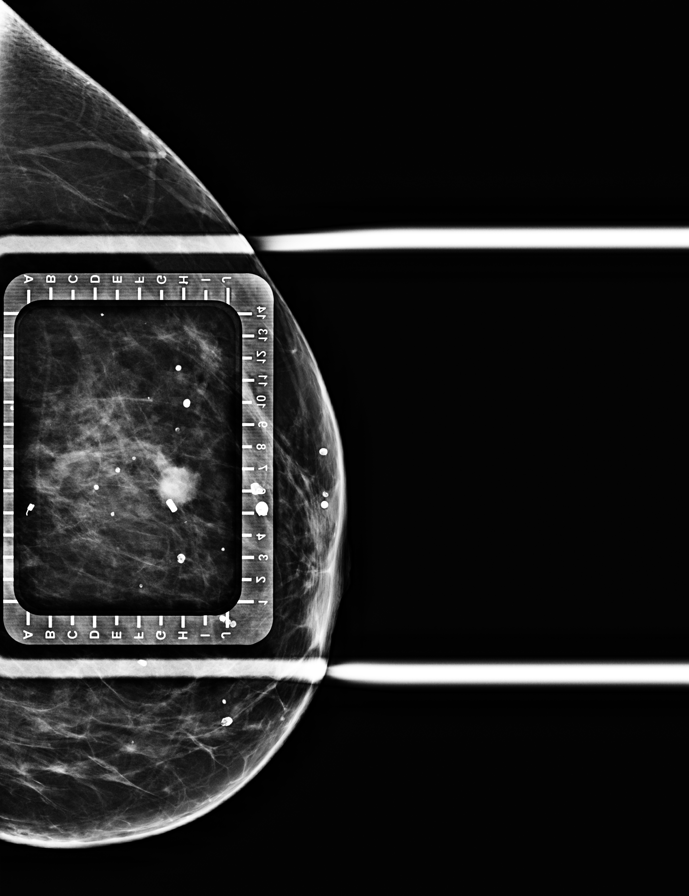

[8 of 9 positions shown; findings below may reference images not displayed]

FINDINGS: Patient presents for radiofrequency device localization prior to
breast conserving surgery of the left breast. I met with the patient
and we discussed the procedure of radiofrequency device localization
including benefits and alternatives. We discussed the high
likelihood of a successful procedure. We discussed the risks of the
procedure including infection, bleeding, tissue injury and further
surgery. Informed, written consent was given.

The usual time-out protocol was performed immediately prior to the
procedure.

Using mammographic guidance, sterile technique, 1% lidocaine as
local anesthesia, a radiofrequency tag was used to localize 3
different biopsy marking clips in the left breast using a lateral
approach:

Posterior/superior extent Loc [PP]- Vision shaped clip marking the
site of invasive mammary carcinoma (ultrasound-guided biopsy dated
[DATE])

Posterior/inferior extent Loc [PP]- Hourglass shaped clip marking
the site of ductal carcinoma in situ (MR guided biopsy dated
[DATE])

Anterior extent Loc [PP]- Cylinder shaped clip marking the site of
atypical ductal hyperplasia (MR guided biopsy dated [DATE])

The follow-up mammogram images confirm that the RF devices are in
the expected locations and are marked for the patient's surgeon. Of
note, wispy ill-defined abnormal enhancement extended 1.2 cm
anterior to the cylinder shaped marking clip and the RF tag nearest
the cylinder clip is located 1.0 cm medial the clip which is a
location favored to be more centrally located relative to the non
mass enhancement seen on the MRI.

The patient tolerated the procedure well and was released from the
[REDACTED].
IMPRESSION: Radiofrequency device three-site localization of the LEFT breast. No
apparent complications.

## 2020-07-10 IMAGING — MG MM PLC BREAST LOC DEV EA ADD LESION INC MAMMO GUIDE*L*
8 series · 8 of 8 positions shown · non-contrast
Comparison: Previous exam(s)

CLINICAL DATA: Here for three-site RF localization prior to breast
conserving surgery. The patient has recently diagnosed invasive
mammary carcinoma and ductal carcinoma in situ of the left breast as
well as an area of atypical ductal hyperplasia on MR guided biopsy.

EXAM:
MAMMOGRAPHIC GUIDED RADIOFREQUENCY DEVICE, THREE SITES
LOCALIZATION OF THE LEFT BREAST

[L CC (1 of 4)]
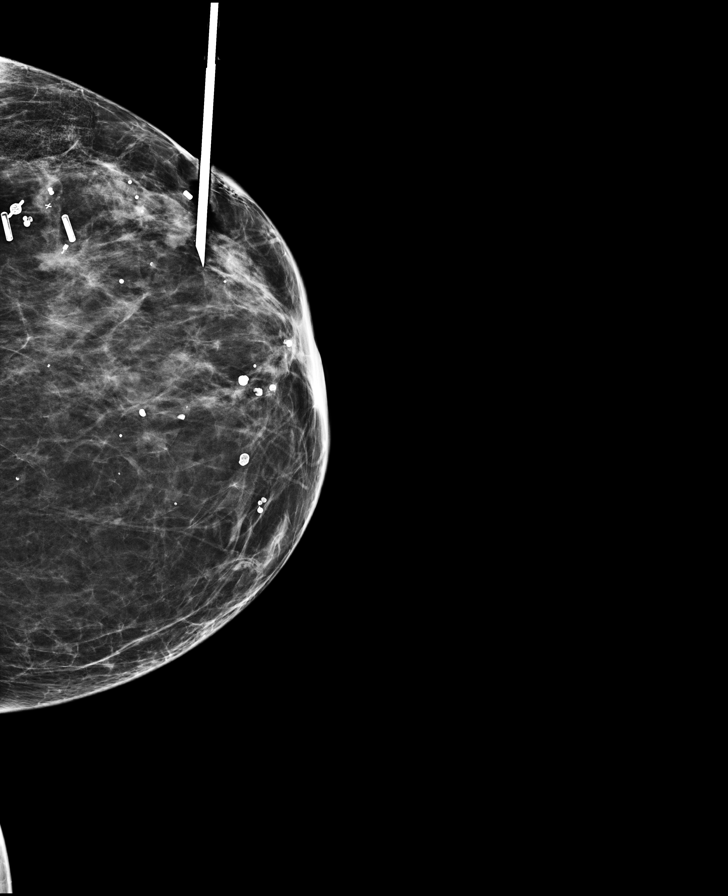

[L LM (1 of 4)]
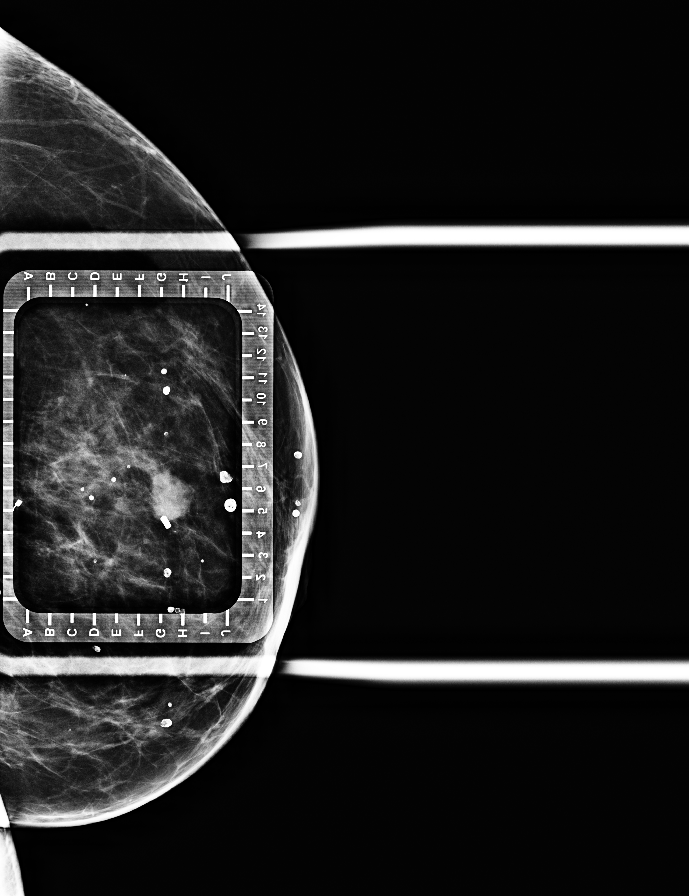

[L CC (2 of 4)]
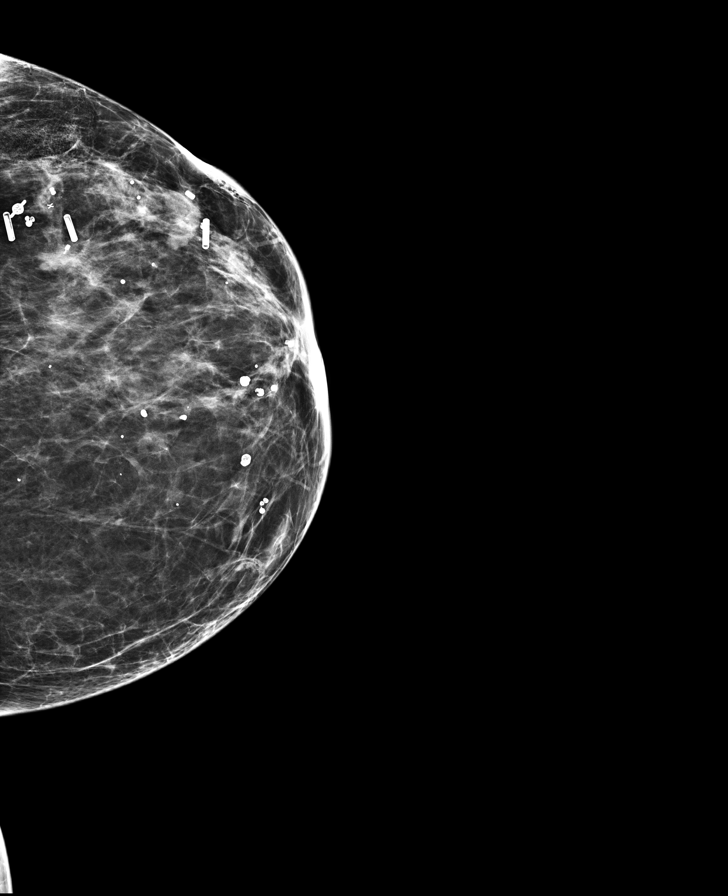

[L CC (3 of 4)]
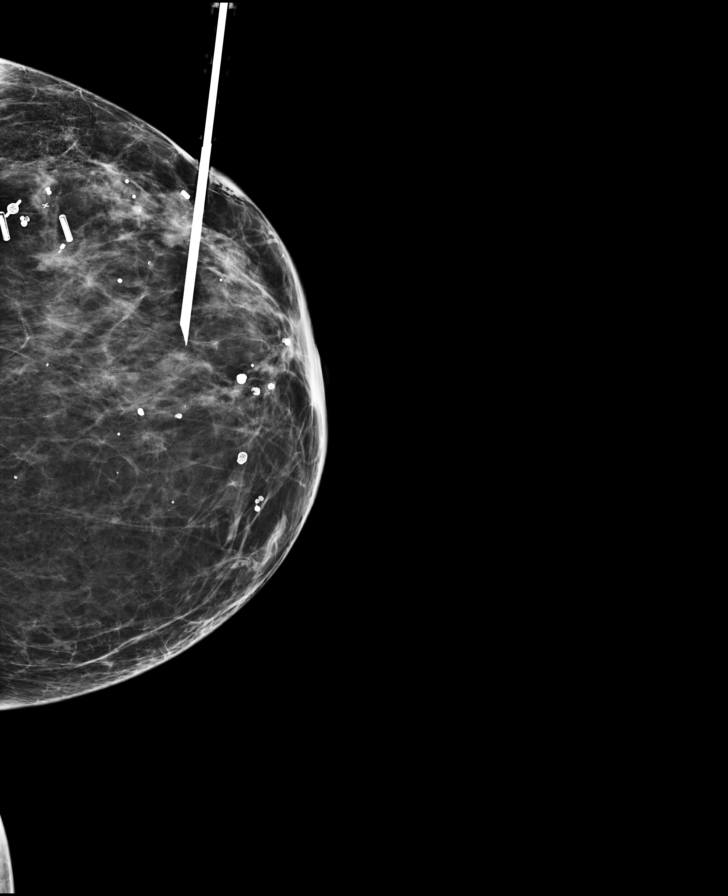

[L LM (2 of 4)]
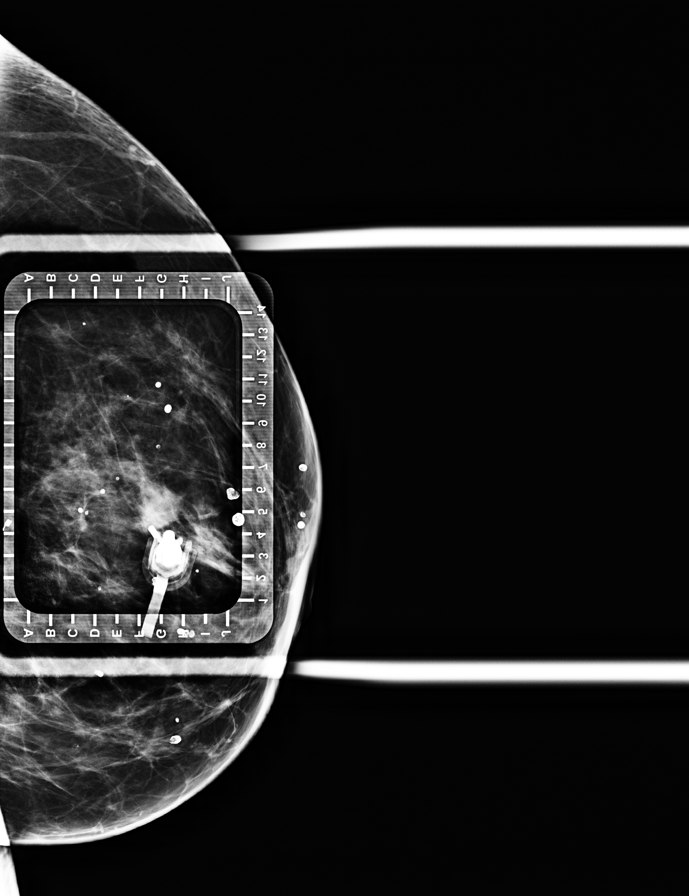

[L LM (3 of 4)]
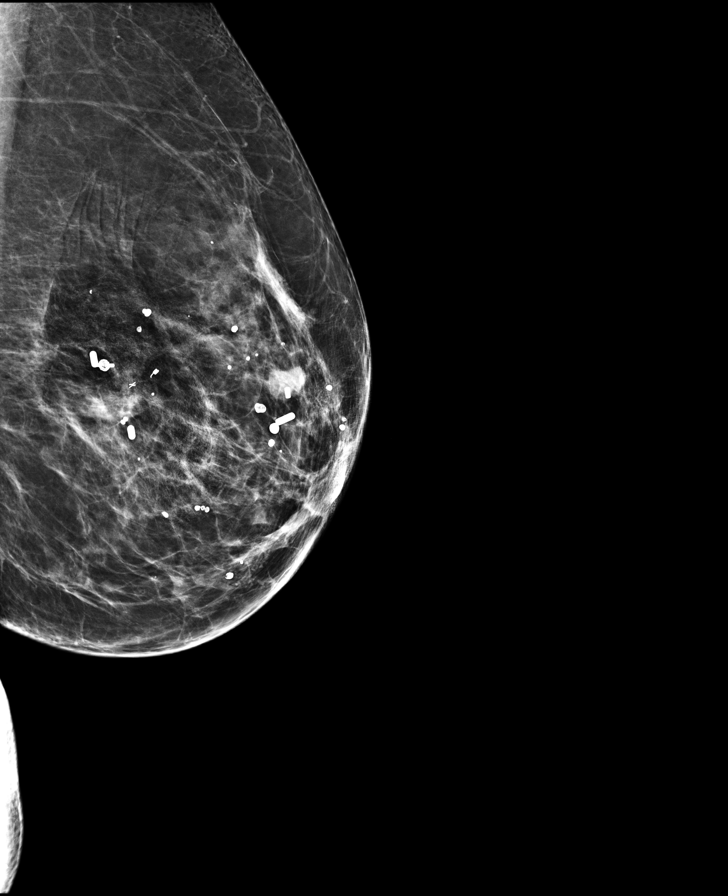

[L CC (4 of 4)]
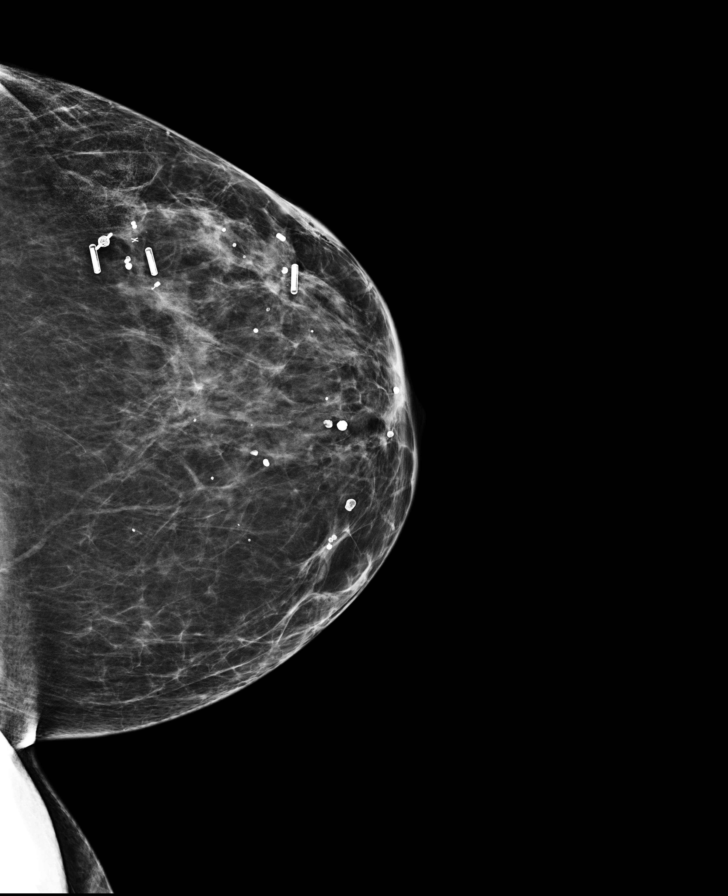

[L LM (4 of 4)]
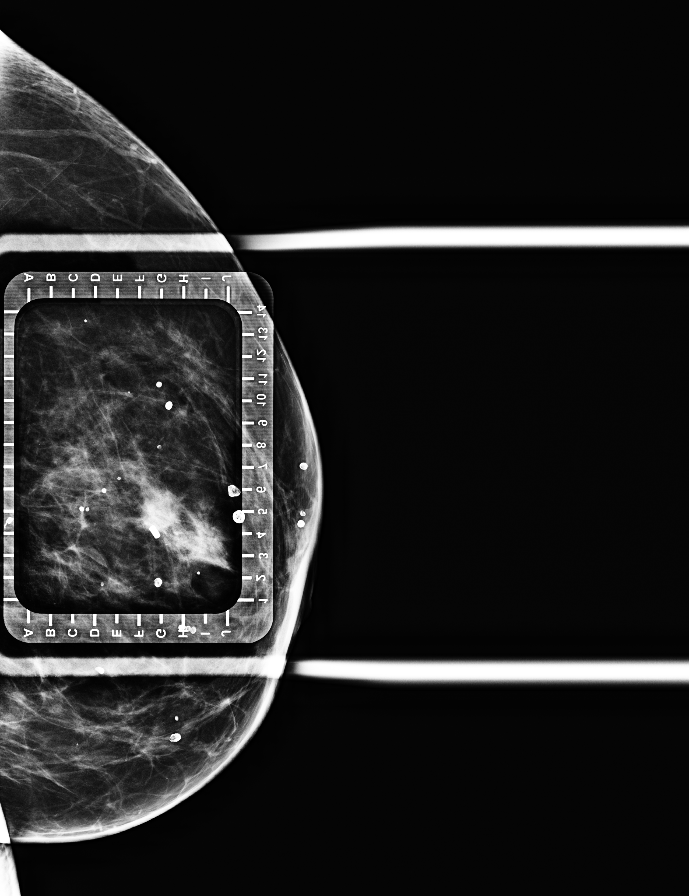

[8 of 8 positions shown; findings below may reference images not displayed]

FINDINGS: Patient presents for radiofrequency device localization prior to
breast conserving surgery of the left breast. I met with the patient
and we discussed the procedure of radiofrequency device localization
including benefits and alternatives. We discussed the high
likelihood of a successful procedure. We discussed the risks of the
procedure including infection, bleeding, tissue injury and further
surgery. Informed, written consent was given.

The usual time-out protocol was performed immediately prior to the
procedure.

Using mammographic guidance, sterile technique, 1% lidocaine as
local anesthesia, a radiofrequency tag was used to localize 3
different biopsy marking clips in the left breast using a lateral
approach:

Posterior/superior extent Loc [PP]- Vision shaped clip marking the
site of invasive mammary carcinoma (ultrasound-guided biopsy dated
[DATE])

Posterior/inferior extent Loc [PP]- Hourglass shaped clip marking
the site of ductal carcinoma in situ (MR guided biopsy dated
[DATE])

Anterior extent Loc [PP]- Cylinder shaped clip marking the site of
atypical ductal hyperplasia (MR guided biopsy dated [DATE])

The follow-up mammogram images confirm that the RF devices are in
the expected locations and are marked for the patient's surgeon. Of
note, wispy ill-defined abnormal enhancement extended 1.2 cm
anterior to the cylinder shaped marking clip and the RF tag nearest
the cylinder clip is located 1.0 cm medial the clip which is a
location favored to be more centrally located relative to the non
mass enhancement seen on the MRI.

The patient tolerated the procedure well and was released from the
[REDACTED].
IMPRESSION: Radiofrequency device three-site localization of the LEFT breast. No
apparent complications.

## 2020-07-11 MED ORDER — GABAPENTIN 300 MG PO CAPS: 300.0000 mg | ORAL_CAPSULE | ORAL | Status: AC

## 2020-07-11 MED ORDER — BUPIVACAINE LIPOSOME 1.3 % IJ SUSP: 20.0000 mL | Freq: Once | INTRAMUSCULAR | Status: DC

## 2020-07-11 MED ORDER — CHLORHEXIDINE GLUCONATE CLOTH 2 % EX PADS: 6.0000 | MEDICATED_PAD | Freq: Once | CUTANEOUS | Status: DC

## 2020-07-11 MED ORDER — ACETAMINOPHEN 500 MG PO TABS: 1000.0000 mg | ORAL_TABLET | ORAL | Status: AC

## 2020-07-11 MED ORDER — CHLORHEXIDINE GLUCONATE 0.12 % MT SOLN: 15.0000 mL | Freq: Once | OROMUCOSAL | Status: AC

## 2020-07-11 MED ORDER — ORAL CARE MOUTH RINSE: 15.0000 mL | Freq: Once | OROMUCOSAL | Status: AC

## 2020-07-11 MED ORDER — CEFAZOLIN SODIUM-DEXTROSE 2-4 GM/100ML-% IV SOLN
2.0000 g | INTRAVENOUS | Status: AC
  Administered 2020-07-12: 2 g via INTRAVENOUS

## 2020-07-11 MED ORDER — SODIUM CHLORIDE 0.9 % IV SOLN: INTRAVENOUS | Status: DC

## 2020-07-12 ENCOUNTER — Ambulatory Visit: Payer: Medicare HMO | Admitting: Certified Registered"

## 2020-07-12 ENCOUNTER — Ambulatory Visit: Payer: Medicare HMO

## 2020-07-12 ENCOUNTER — Encounter: Payer: Self-pay | Admitting: General Surgery

## 2020-07-12 ENCOUNTER — Ambulatory Visit
Admission: RE | Admit: 2020-07-12 | Discharge: 2020-07-12 | Disposition: A | Discharge status: Home / Self Care | Payer: Medicare HMO | Attending: General Surgery | Admitting: General Surgery

## 2020-07-12 ENCOUNTER — Encounter
Admission: RE | Disposition: A | Discharge status: Home / Self Care | Payer: Self-pay | Source: Home / Self Care | Attending: General Surgery

## 2020-07-12 ENCOUNTER — Other Ambulatory Visit: Payer: Self-pay

## 2020-07-12 ENCOUNTER — Ambulatory Visit
Admission: RE | Admit: 2020-07-12 | Discharge: 2020-07-12 | Disposition: A | Discharge status: Home / Self Care | Payer: Medicare HMO | Source: Ambulatory Visit | Attending: General Surgery | Admitting: General Surgery

## 2020-07-12 DIAGNOSIS — N6022 Fibroadenosis of left breast: Secondary | ICD-10-CM | POA: Diagnosis not present

## 2020-07-12 DIAGNOSIS — C50412 Malignant neoplasm of upper-outer quadrant of left female breast: Secondary | ICD-10-CM

## 2020-07-12 DIAGNOSIS — Z170 Estrogen receptor positive status [ER+]: Secondary | ICD-10-CM

## 2020-07-12 DIAGNOSIS — Z7982 Long term (current) use of aspirin: Secondary | ICD-10-CM | POA: Diagnosis not present

## 2020-07-12 DIAGNOSIS — Z419 Encounter for procedure for purposes other than remedying health state, unspecified: Secondary | ICD-10-CM

## 2020-07-12 DIAGNOSIS — Z87891 Personal history of nicotine dependence: Secondary | ICD-10-CM | POA: Diagnosis not present

## 2020-07-12 DIAGNOSIS — Z803 Family history of malignant neoplasm of breast: Secondary | ICD-10-CM | POA: Diagnosis not present

## 2020-07-12 DIAGNOSIS — D0512 Intraductal carcinoma in situ of left breast: Secondary | ICD-10-CM

## 2020-07-12 DIAGNOSIS — I97791 Other intraoperative cardiac functional disturbances during other surgery: Secondary | ICD-10-CM | POA: Diagnosis not present

## 2020-07-12 DIAGNOSIS — Z79899 Other long term (current) drug therapy: Secondary | ICD-10-CM | POA: Insufficient documentation

## 2020-07-12 DIAGNOSIS — N6082 Other benign mammary dysplasias of left breast: Secondary | ICD-10-CM | POA: Diagnosis not present

## 2020-07-12 DIAGNOSIS — Z853 Personal history of malignant neoplasm of breast: Secondary | ICD-10-CM

## 2020-07-12 HISTORY — PX: BREAST LUMPECTOMY,RADIO FREQ LOCALIZER,AXILLARY SENTINEL LYMPH NODE BIOPSY: SHX6900

## 2020-07-12 LAB — GLUCOSE, CAPILLARY
Glucose-Capillary: 103 mg/dL — ABNORMAL HIGH (ref 70–99)
Glucose-Capillary: 122 mg/dL — ABNORMAL HIGH (ref 70–99)

## 2020-07-12 IMAGING — RF DG C-ARM 1-60 MIN
1 series · 7 of 7 positions shown · non-contrast
Comparison: Same day specimen radiograph.

CLINICAL DATA: Evaluation for RF ID tag.

EXAM:
CHEST  1 VIEW; DG C-ARM 1-60 MIN

[Series 1: run · 7 of 7 slices shown]
[im 1/7]
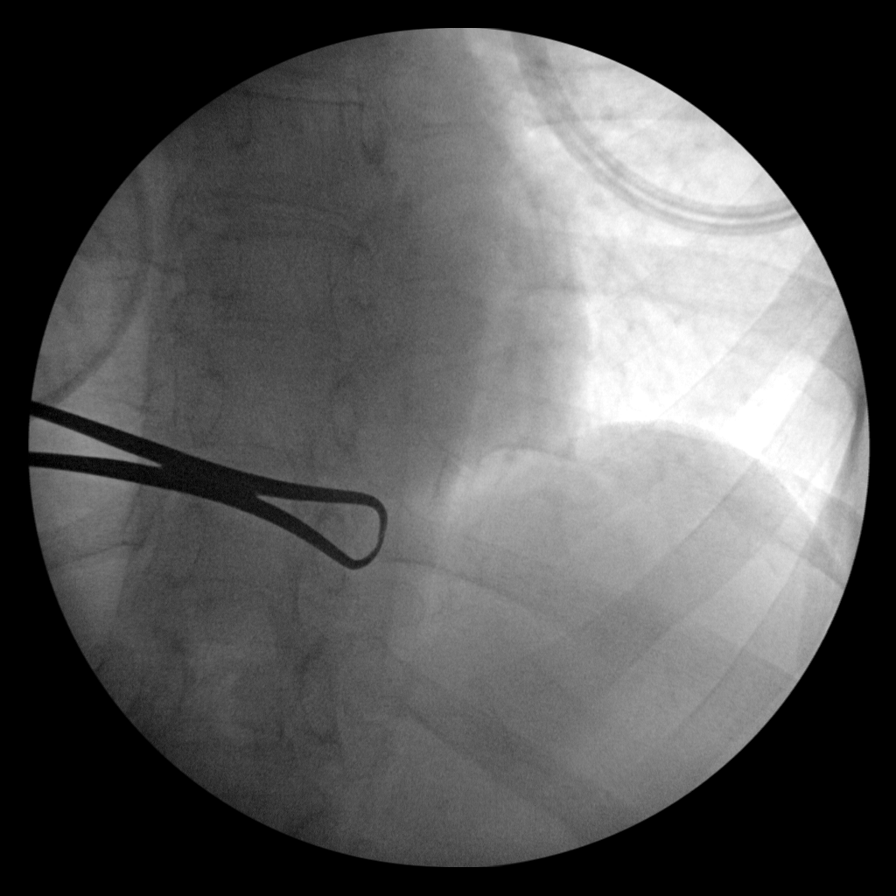
[im 2/7]
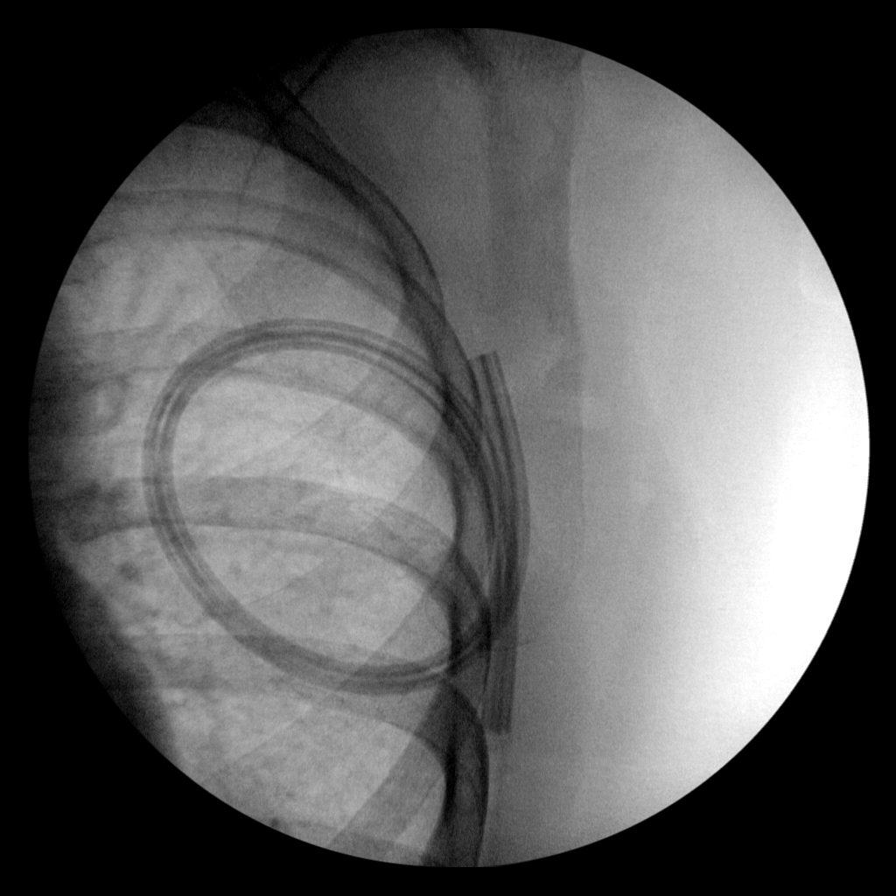
[im 3/7]
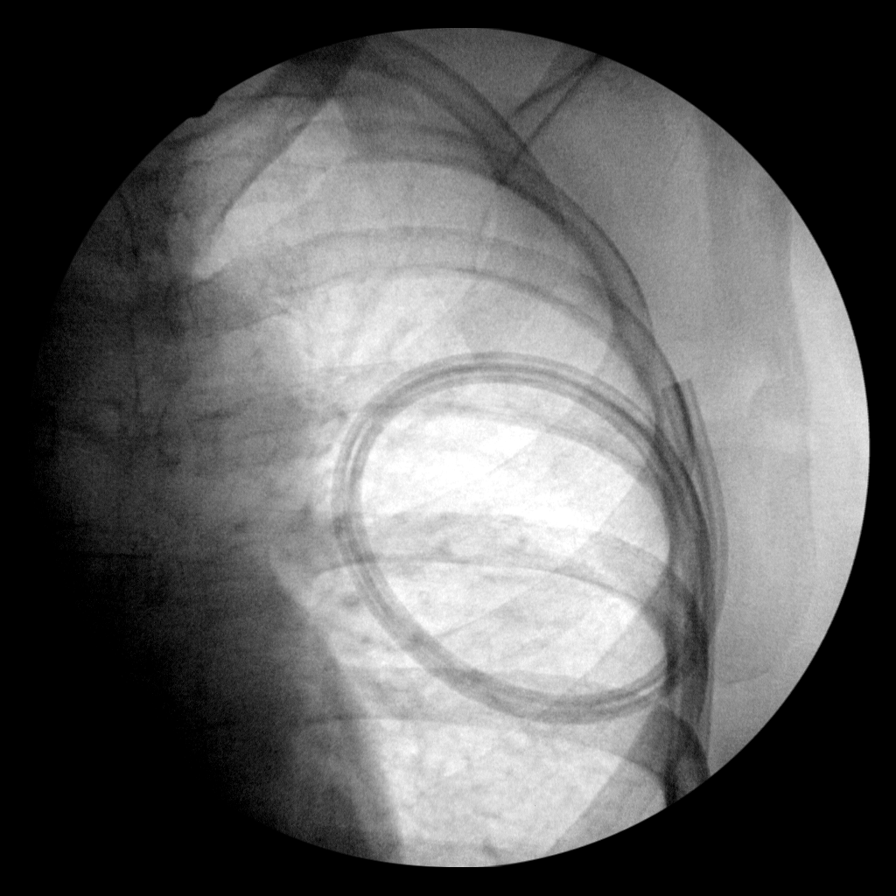
[im 4/7]
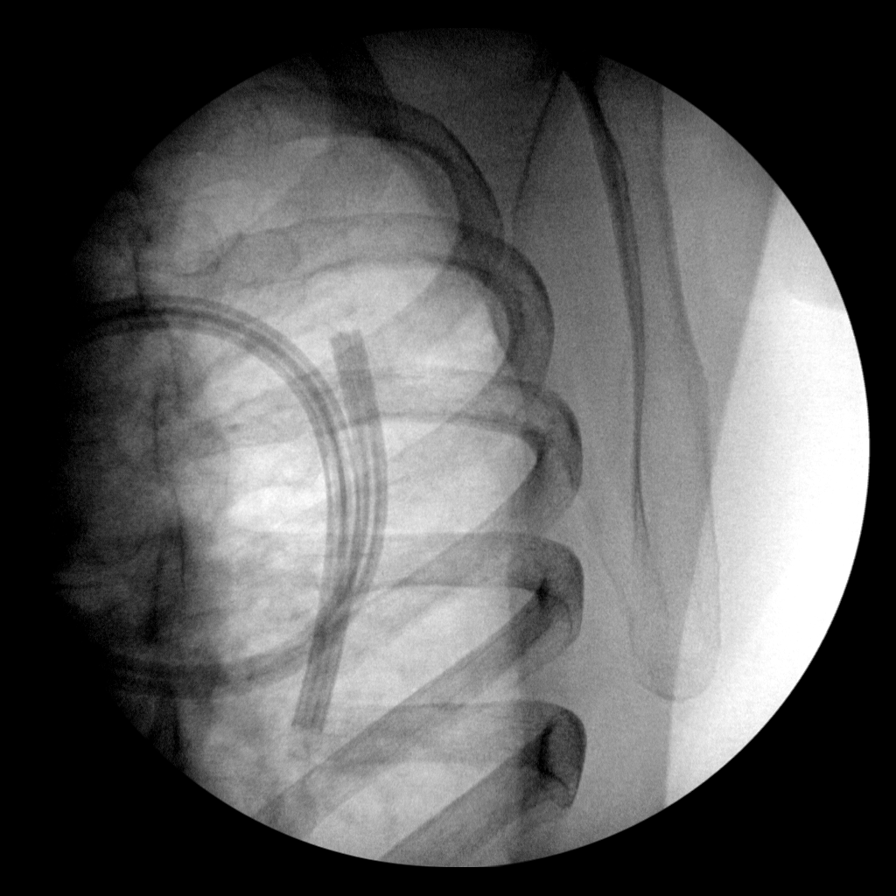
[im 5/7]
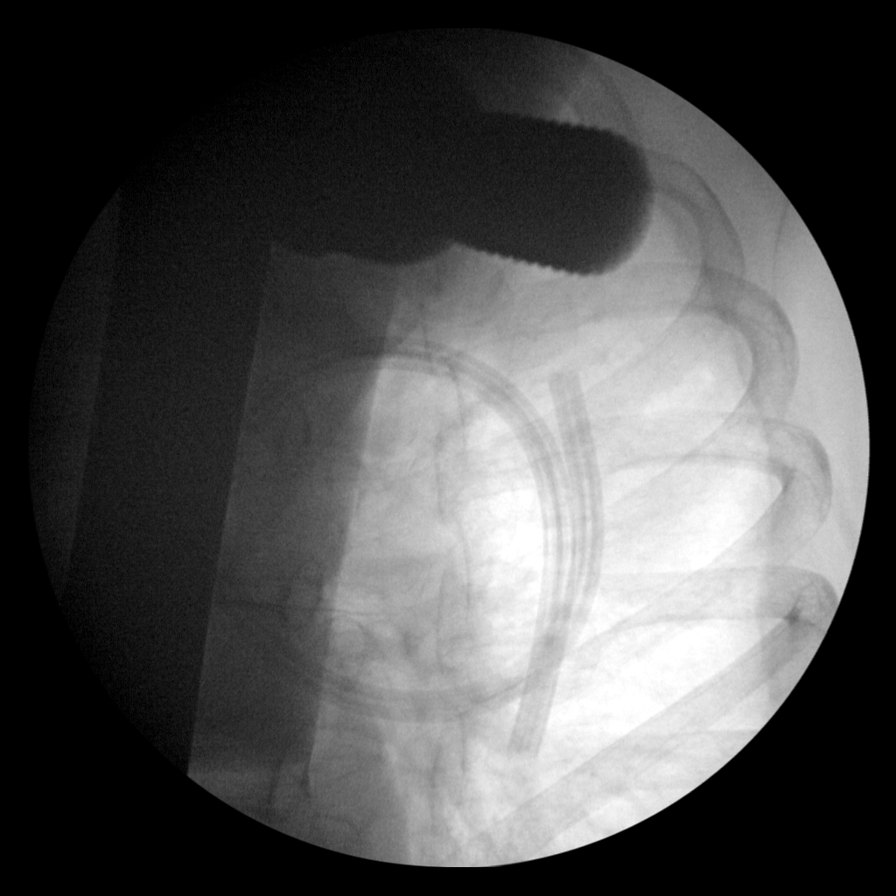
[im 6/7]
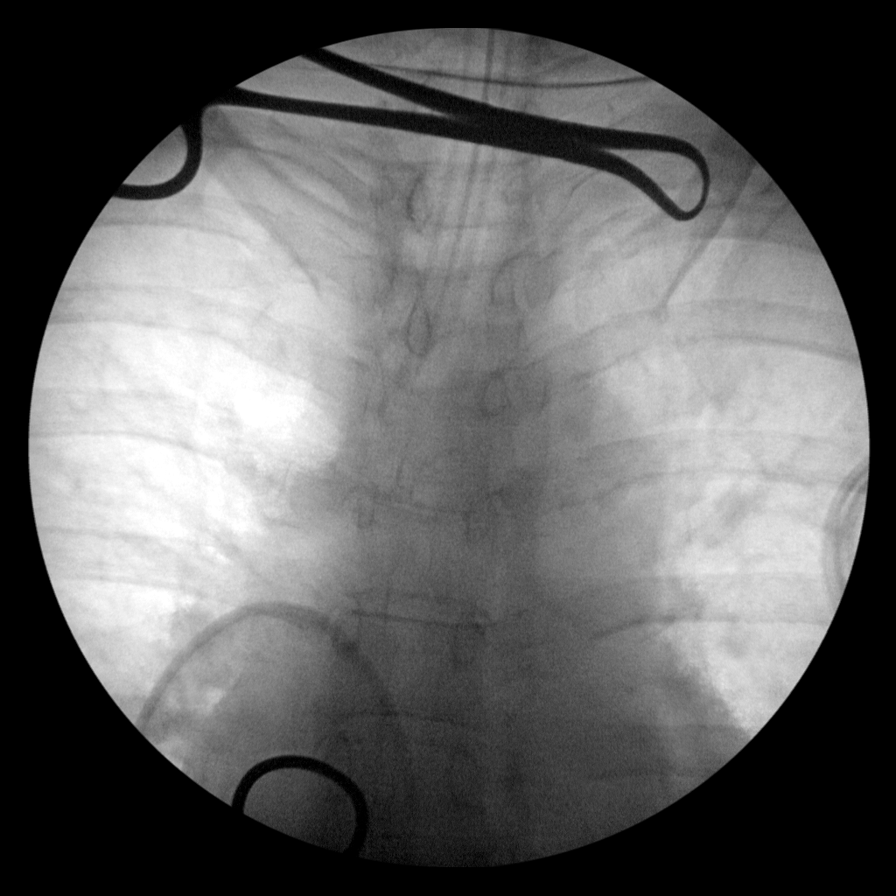
[im 7/7]
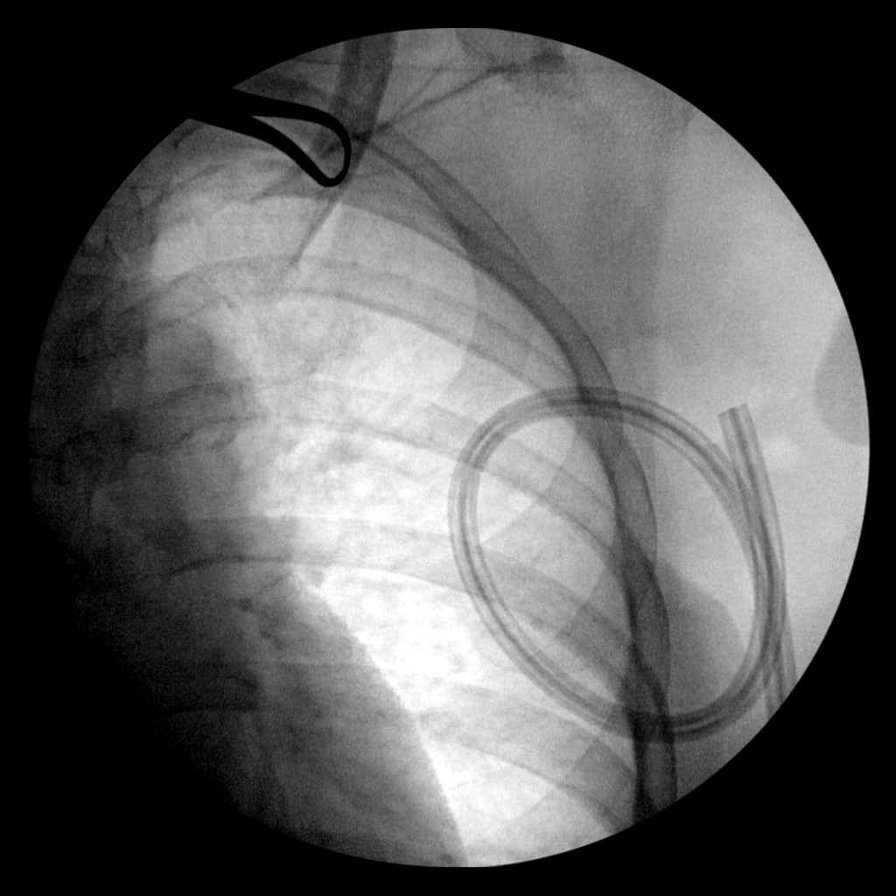

[7 of 7 positions shown; findings below may reference images not displayed]

FINDINGS: Spot fluoroscopic radiographs do not not demonstrate an unexpected
retained foreign body in the provided fields of view. Patient is
intubated. Surgical clamps are in the field of view. Surgical drain.
Normal cardiomediastinal silhouette for technique.
IMPRESSION: No retained foreign body identified. Recommend follow-up LEFT
diagnostic mammogram in 2-3 months after appropriate healing.

These final results were called by telephone at the time of
interpretation on [DATE] at 2: 02 pm to provider OMAYLI DANIELA,
who verbally acknowledged these results.

## 2020-07-12 IMAGING — RF DG CHEST 1V
1 series · 7 of 7 positions shown · non-contrast
Comparison: Same day specimen radiograph.

CLINICAL DATA: Evaluation for RF ID tag.

EXAM:
CHEST  1 VIEW; DG C-ARM 1-60 MIN

[Series 1: run · 7 of 7 slices shown]
[im 1/7]
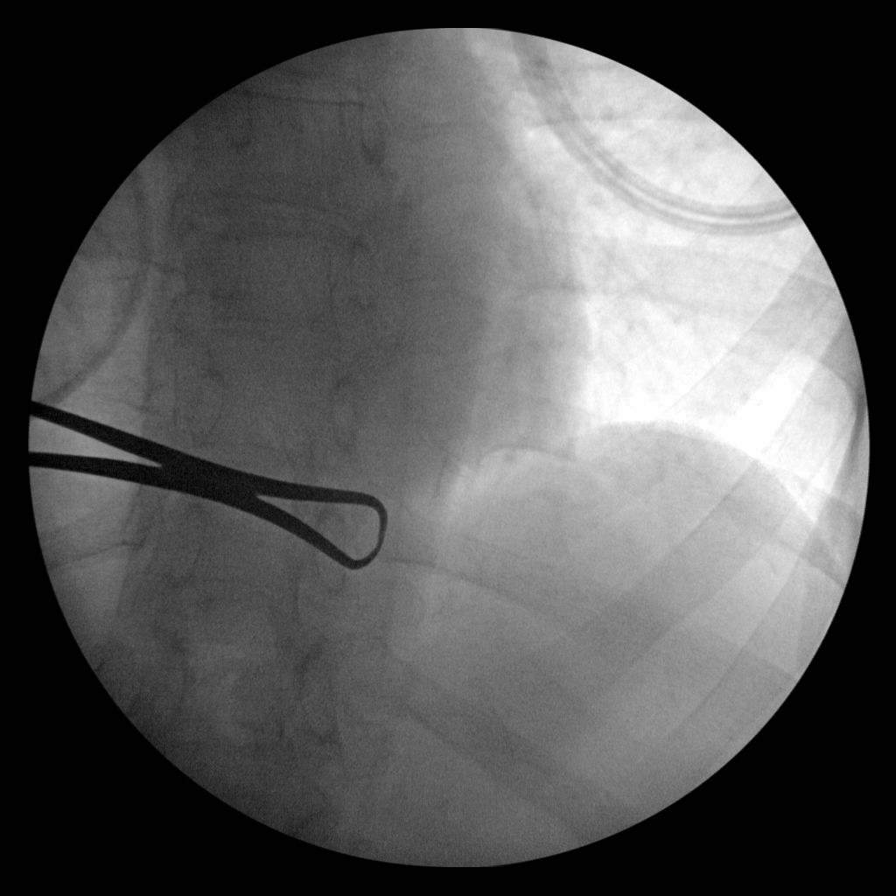
[im 2/7]
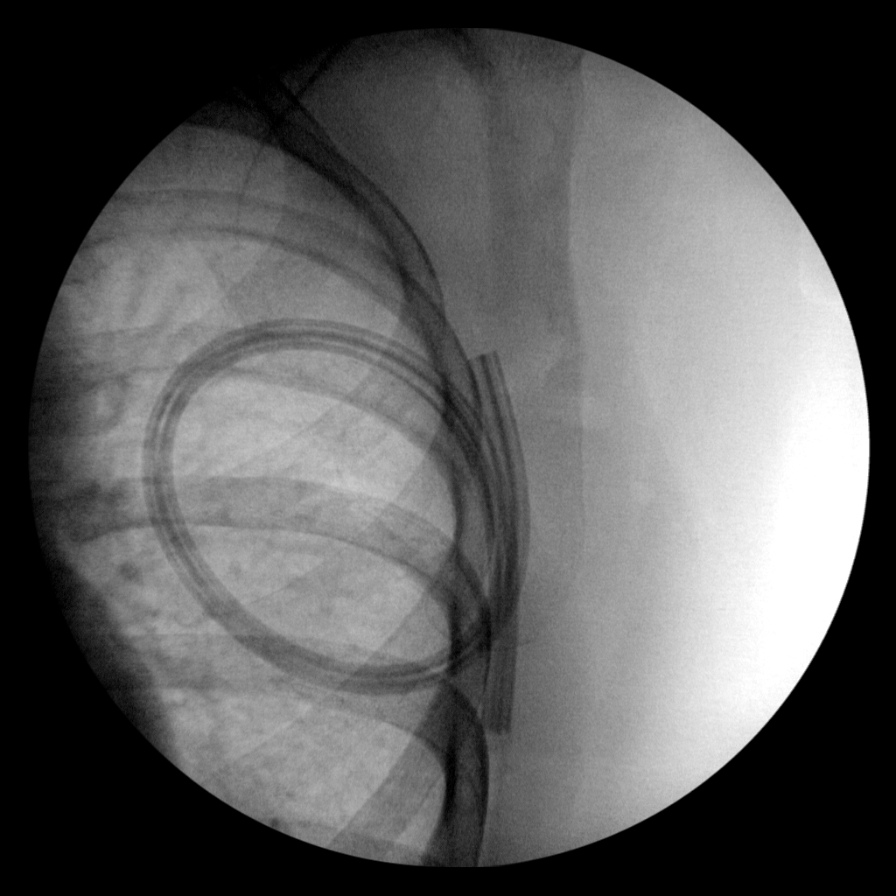
[im 3/7]
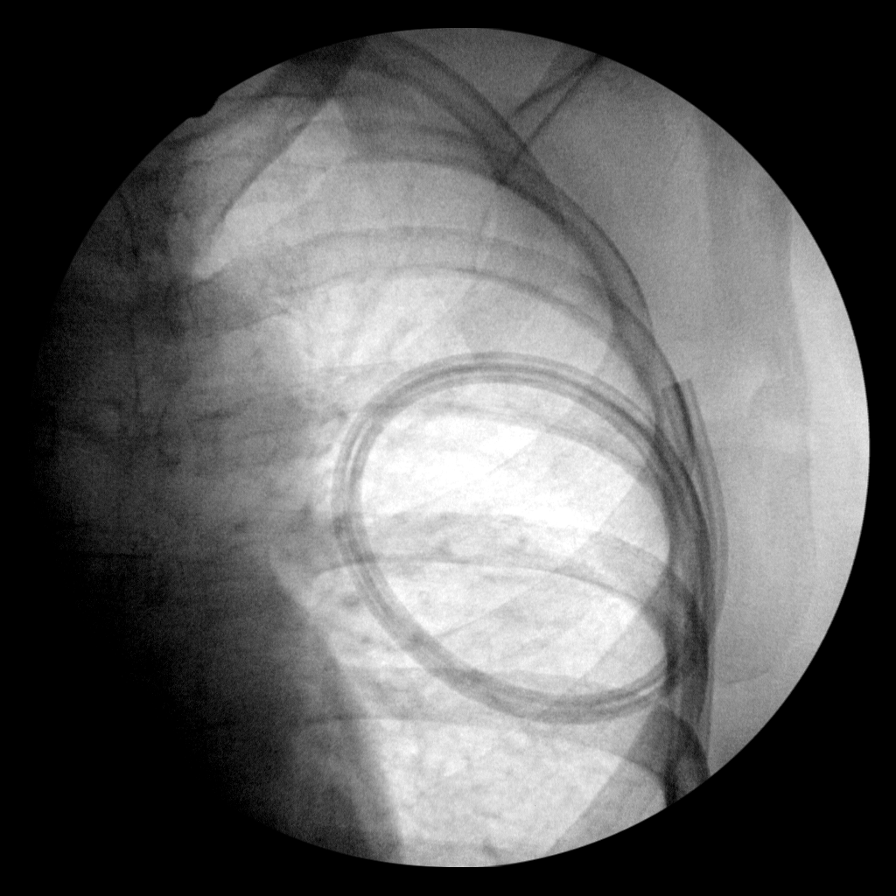
[im 4/7]
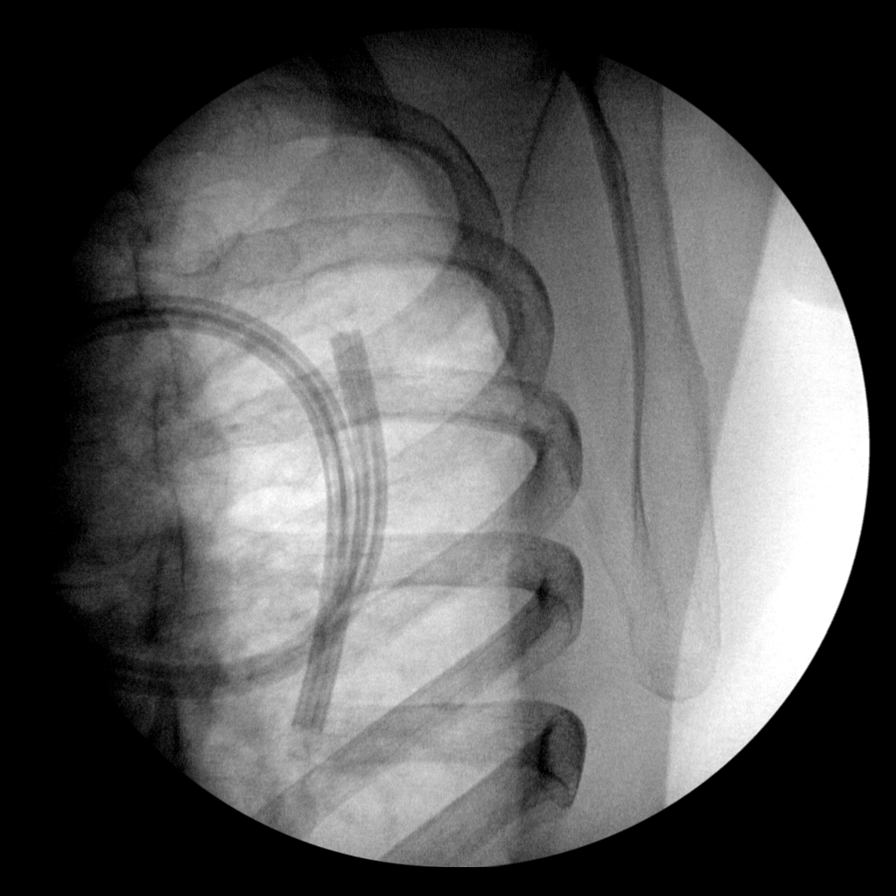
[im 5/7]
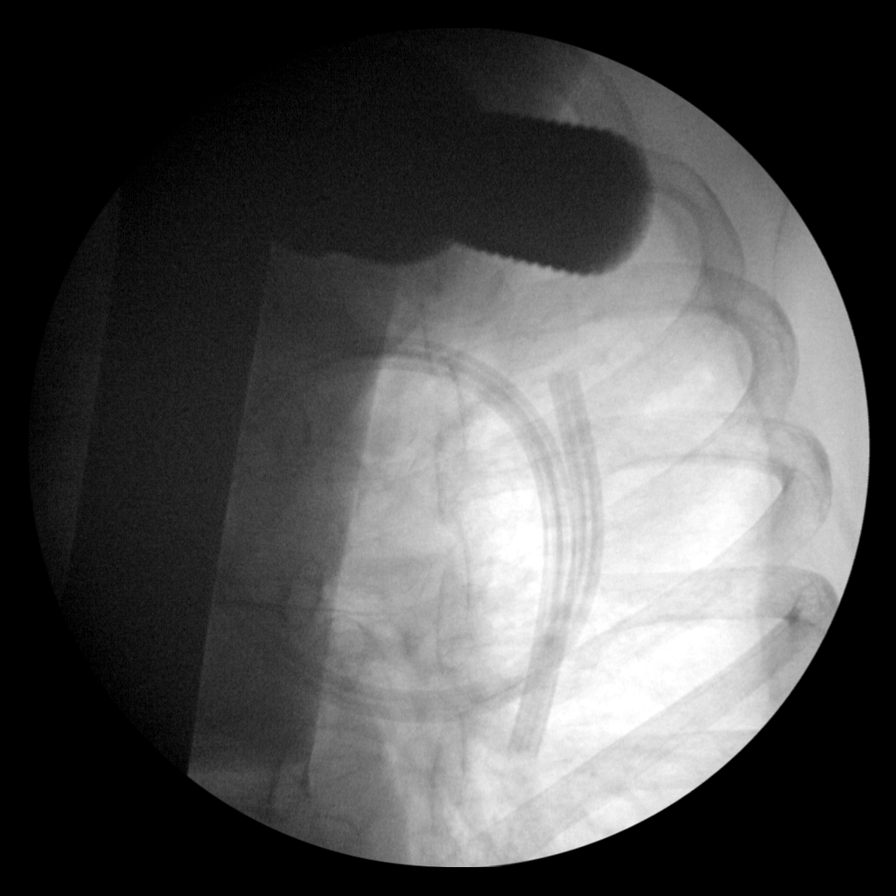
[im 6/7]
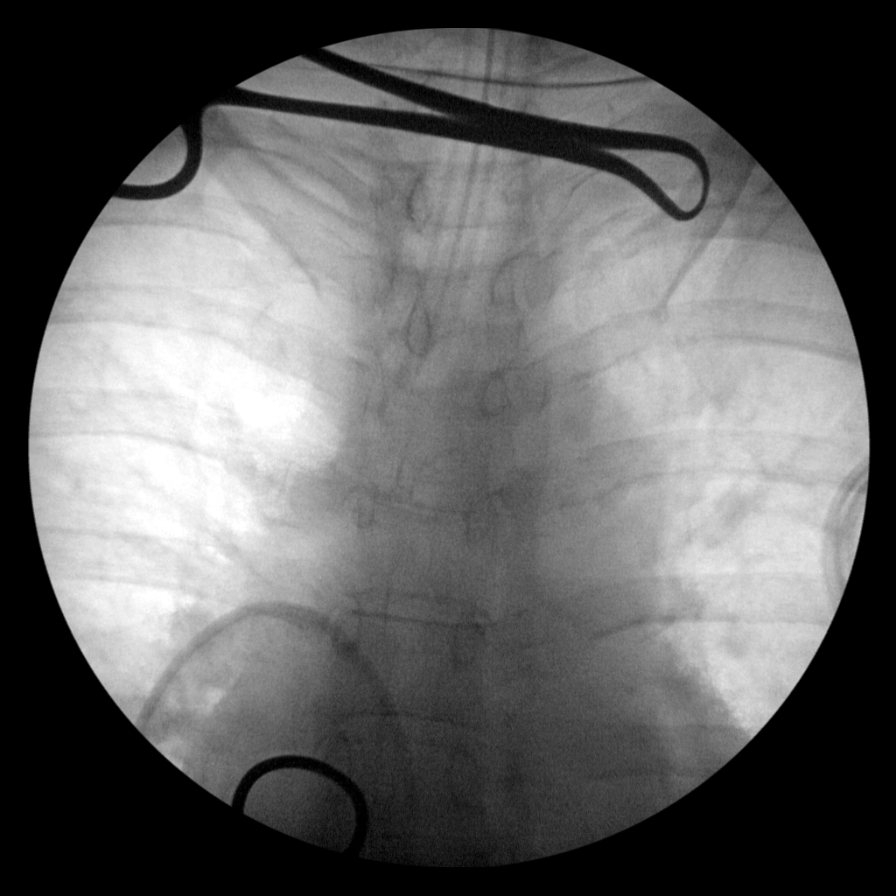
[im 7/7]
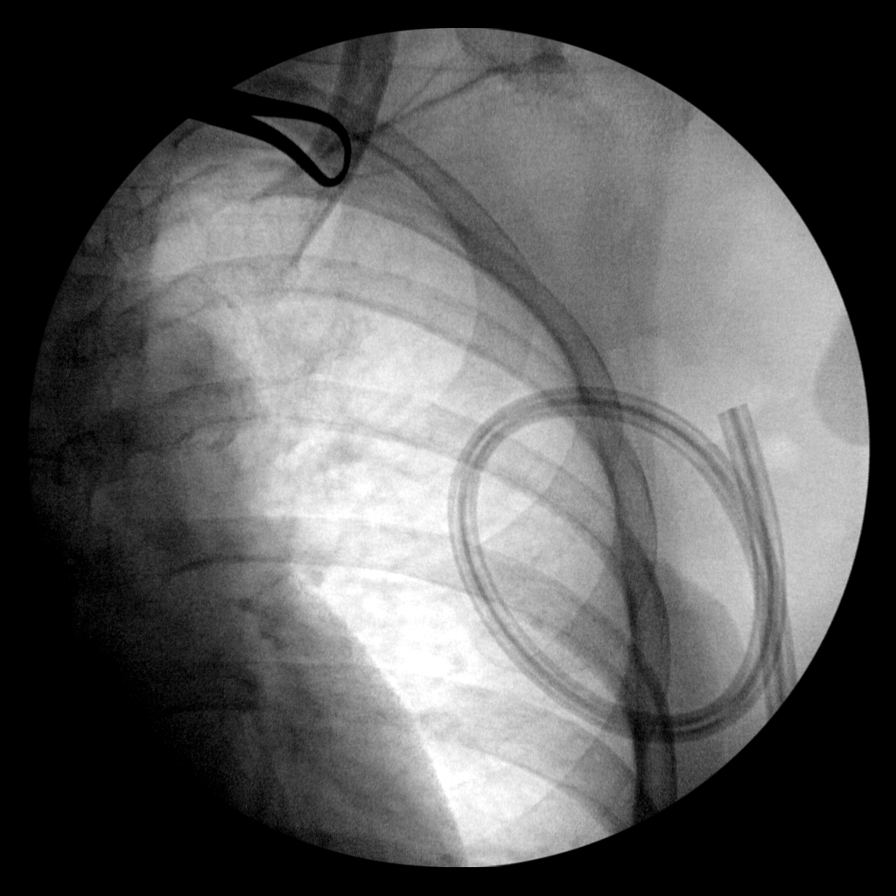

[7 of 7 positions shown; findings below may reference images not displayed]

FINDINGS: Spot fluoroscopic radiographs do not not demonstrate an unexpected
retained foreign body in the provided fields of view. Patient is
intubated. Surgical clamps are in the field of view. Surgical drain.
Normal cardiomediastinal silhouette for technique.
IMPRESSION: No retained foreign body identified. Recommend follow-up LEFT
diagnostic mammogram in 2-3 months after appropriate healing.

These final results were called by telephone at the time of
interpretation on [DATE] at 2: 02 pm to provider OMAYLI DANIELA,
who verbally acknowledged these results.

## 2020-07-12 IMAGING — DX DG CHEST 1V
1 series · 1 of 1 positions shown · non-contrast
Comparison: None.

CLINICAL DATA: Retained foreign body evaluation

EXAM:
CHEST  1 VIEW

[chest ap]
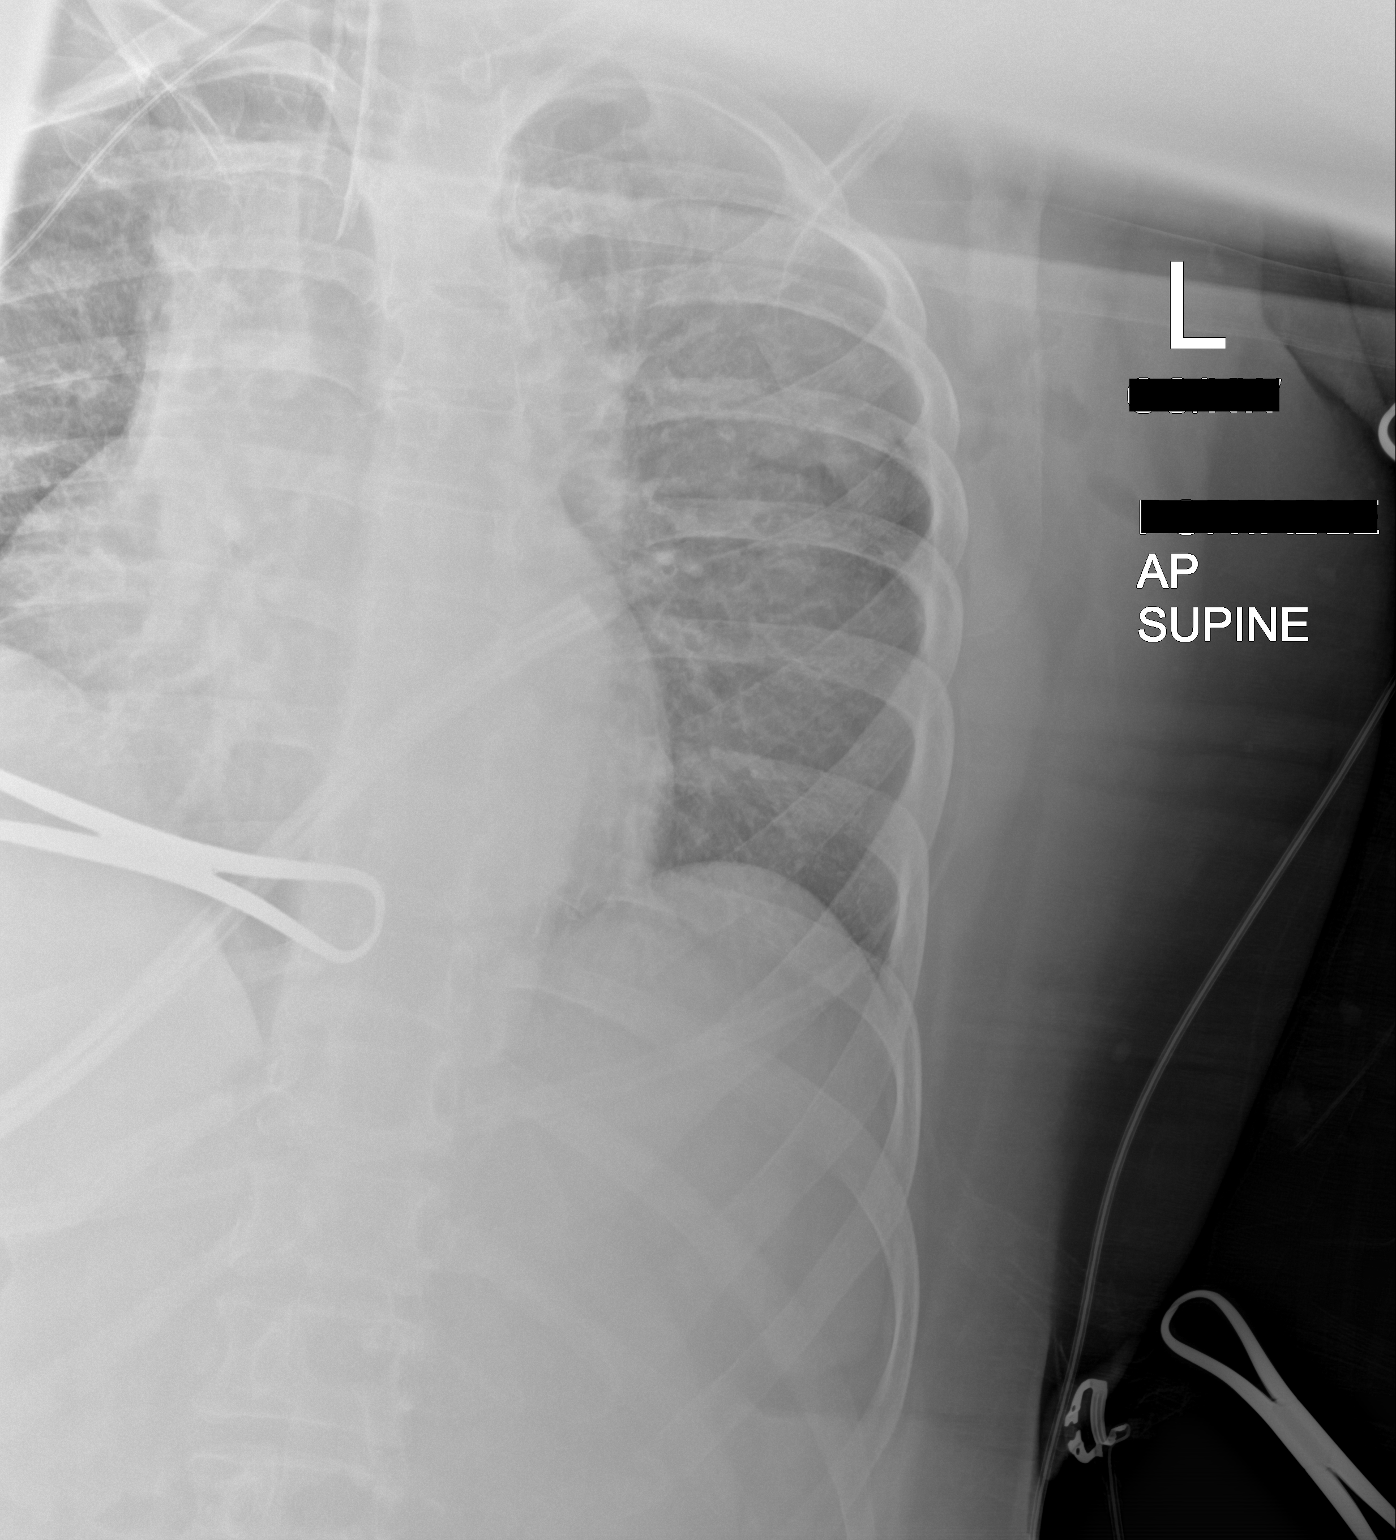

[1 of 1 positions shown; findings below may reference images not displayed]

FINDINGS: The LEFT lateral chest is visualized to the level of the anterior
tenth rib. Spot radiograph does not demonstrate an unexpected
retained foreign body in the provided field of view. Patient is
intubated. Surgical clamps are in the field of view. Surgical drain.
Normal cardiomediastinal silhouette for technique.
IMPRESSION: No retained foreign body identified. Recommend follow-up LEFT
diagnostic mammogram in 2-3 months after appropriate healing.

## 2020-07-12 IMAGING — MG MM BREAST SURGICAL SPECIMEN
9 of 11 series · 9 of 11 positions shown · non-contrast
Comparison: Previous exam(s).

EXAM:
SPECIMEN RADIOGRAPH OF THE LEFT BREAST

[L (1 of 6)]
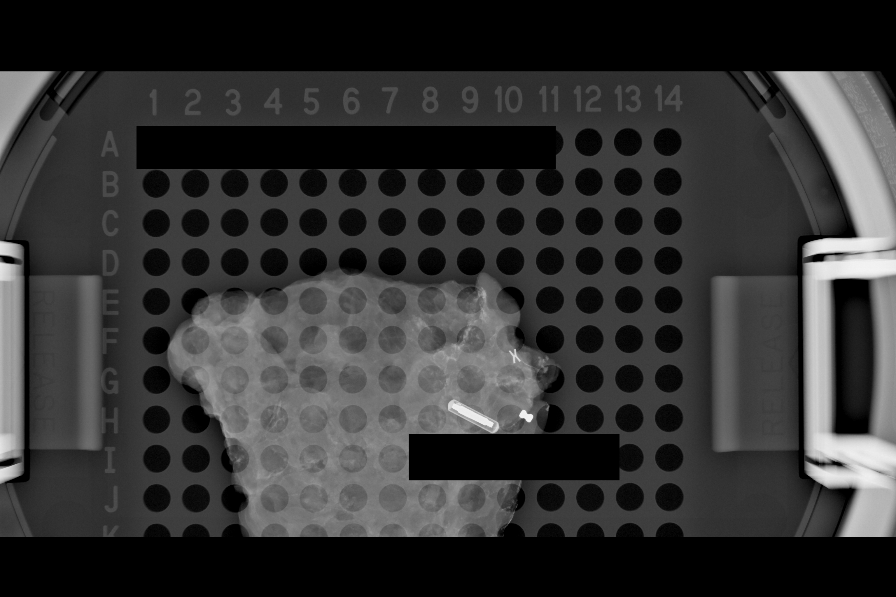

[L (2 of 6)]
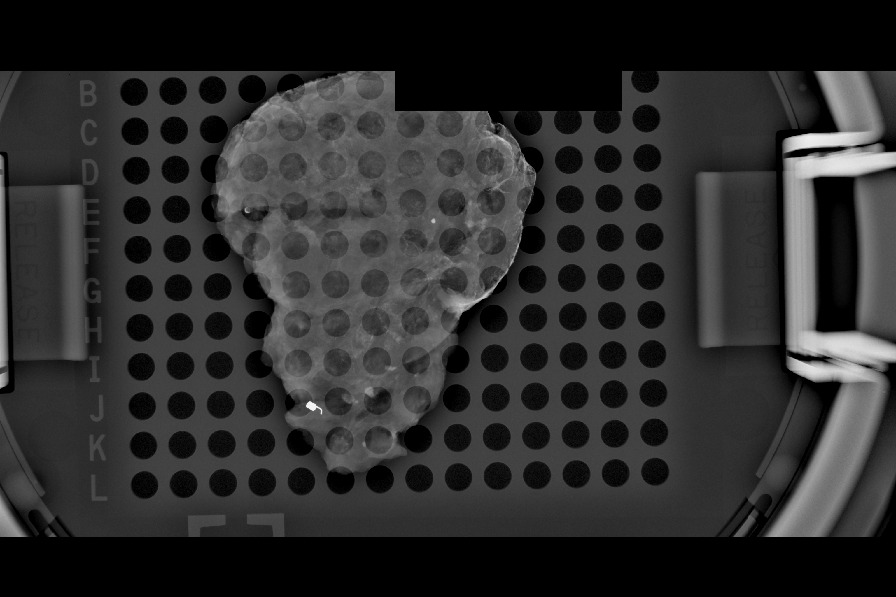

[L (3 of 6)]
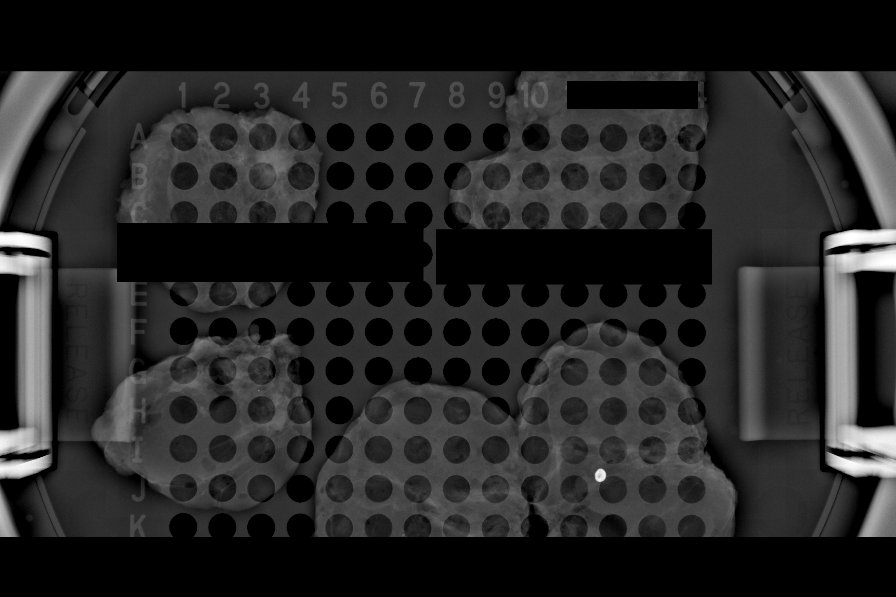

[L (4 of 6)]
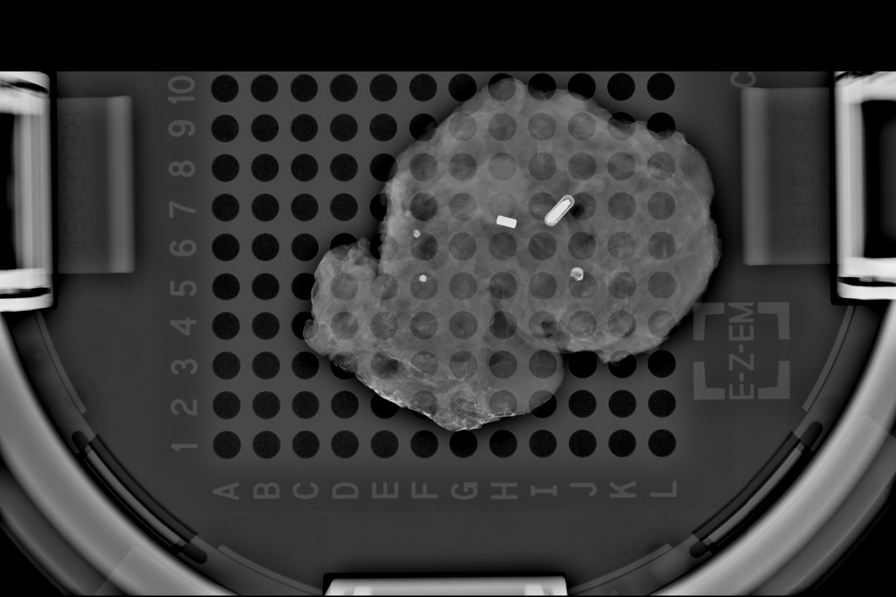

[L (5 of 6)]
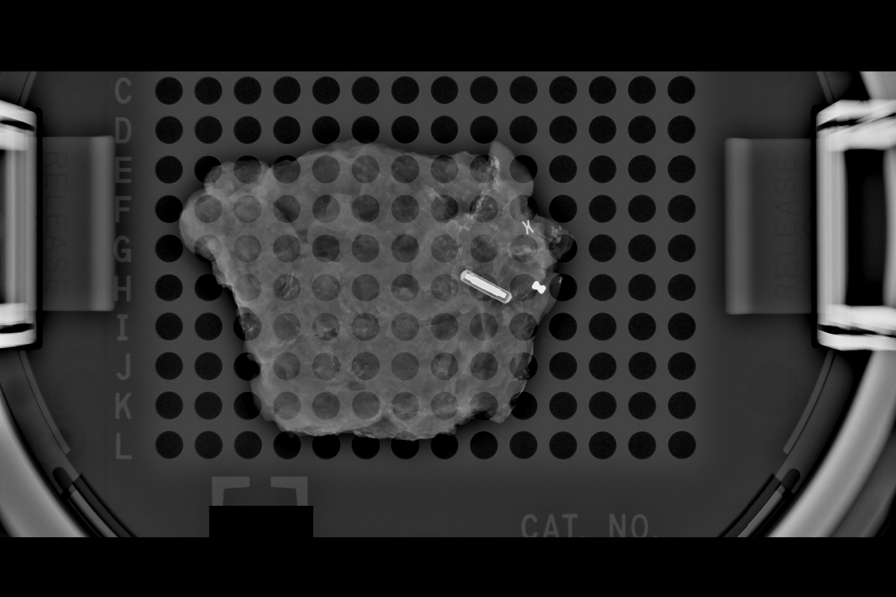

[L (6 of 6)]
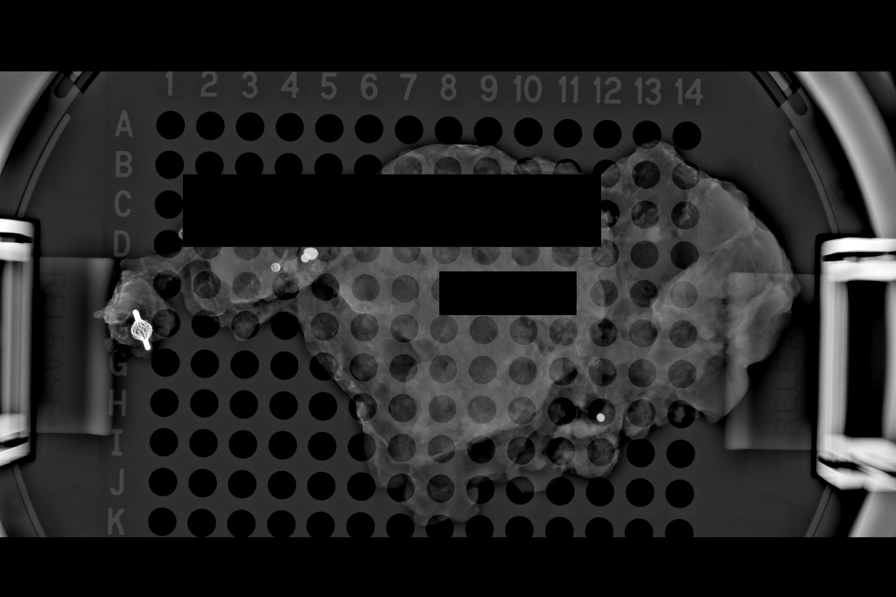

[L SPECIMEN (1 of 3)]
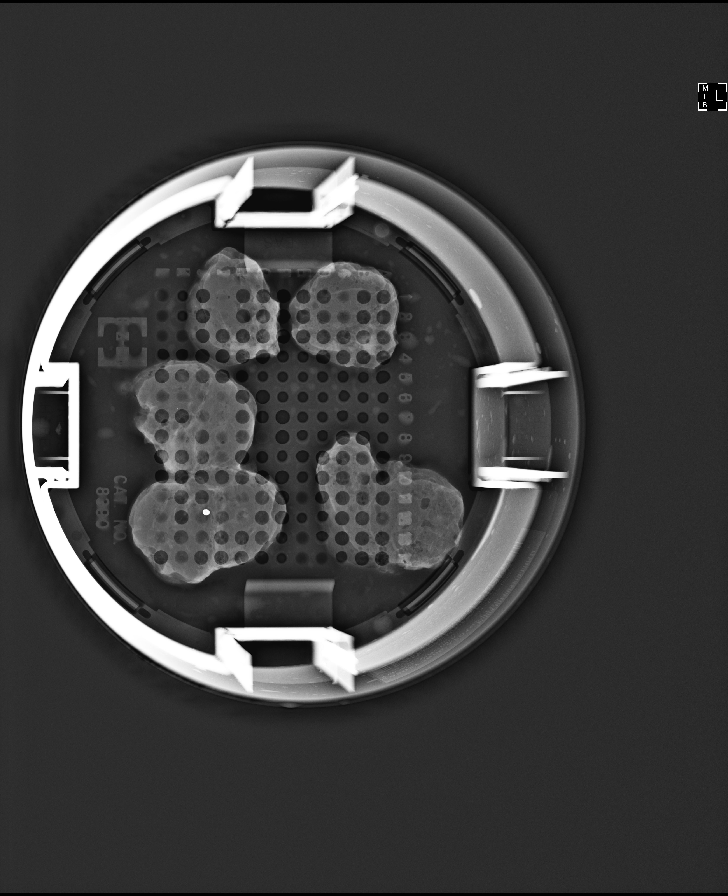

[L SPECIMEN (2 of 3)]
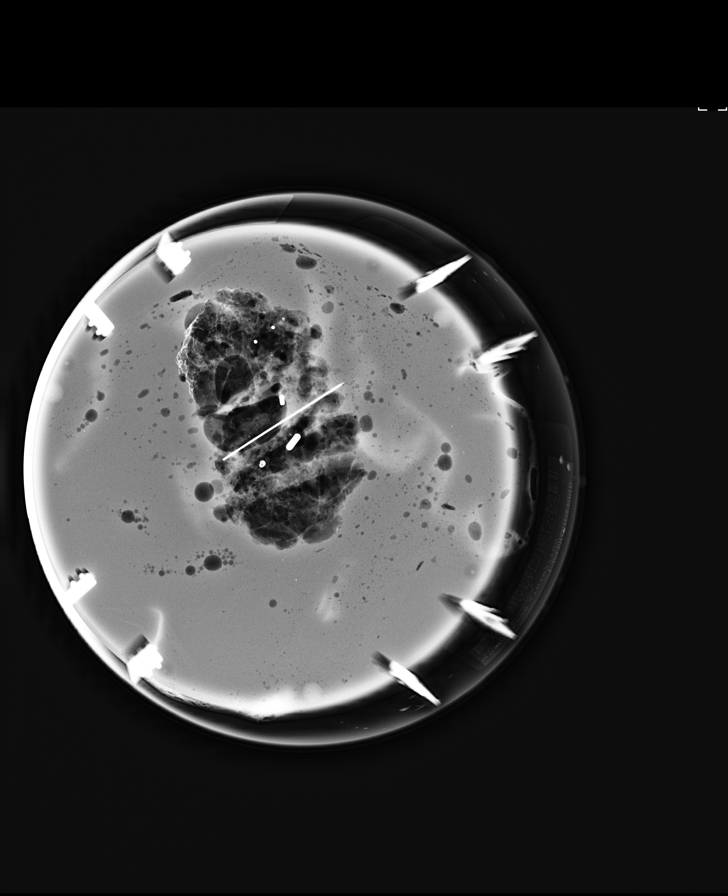

[L SPECIMEN (3 of 3)]
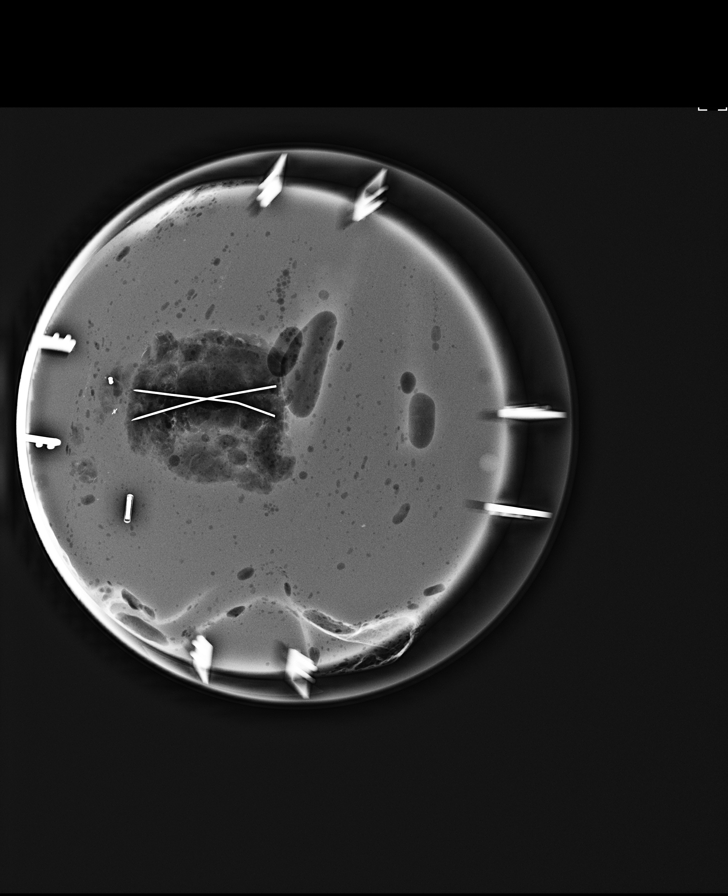

[9 of 11 positions shown; findings below may reference images not displayed]

FINDINGS: Status post excision of the LEFT breast.

Patient is status post 3 site RF tag placement for multifocal
malignancy. Two of the RF tags are identified within the pathologic
specimens. Repeat radiographs were performed of the specimens within
formalin. This confirmed that the third RF tag is not within the
provided surgical specimens. Specimens contain a CYLINDER clip (MR
biopsy demonstrating ADH), barbell clip (MR biopsy demonstrating
DCIS), X clip (stereo biopsy demonstrating IDC, vision clip
(ultrasound-guided biopsy demonstrating IDC), and COIL clip (stereo
biopsy, benign). All expected biopsy clips are accounted for. The RF
tag which cannot be accounted for is most likely the 1 that was
adjacent to the vision clip (LOC [IF]).
IMPRESSION: Specimen radiograph of the LEFT breast. Two of the 3 RF tags are
present in the surgical specimens. Dedicated radiographs of the
patient were unrevealing. As such, recommend LEFT diagnostic
mammogram in 2-3 months after appropriate healing to assess for
whether there is a retained RF tag.

These final results were called by telephone at the time of
interpretation on [DATE] at [DATE] to provider GATAIANT ,
who verbally acknowledged these results.

## 2020-07-12 SURGERY — BREAST LUMPECTOMY,RADIO FREQ LOCALIZER,AXILLARY SENTINEL LYMPH NODE BIOPSY
Anesthesia: General | Site: Breast | Laterality: Left

## 2020-07-12 MED ORDER — IBUPROFEN 800 MG PO TABS: 800.0000 mg | ORAL_TABLET | Freq: Three times a day (TID) | ORAL | 0 refills | Status: DC | PRN

## 2020-07-12 MED ORDER — LIDOCAINE HCL (PF) 2 % IJ SOLN
INTRAMUSCULAR | Status: AC
  Filled 2020-07-12: qty 5

## 2020-07-12 MED ORDER — LACTATED RINGERS IV SOLN: INTRAVENOUS | Status: DC | PRN

## 2020-07-12 MED ORDER — ONDANSETRON HCL 4 MG/2ML IJ SOLN
INTRAMUSCULAR | Status: AC
  Administered 2020-07-12: 4 mg
  Filled 2020-07-12: qty 2

## 2020-07-12 MED ORDER — LIDOCAINE-EPINEPHRINE 1 %-1:100000 IJ SOLN
INTRAMUSCULAR | Status: AC
  Filled 2020-07-12: qty 1

## 2020-07-12 MED ORDER — EPHEDRINE SULFATE 50 MG/ML IJ SOLN
INTRAMUSCULAR | Status: DC | PRN
  Administered 2020-07-12 (×2): 10 mg via INTRAVENOUS
  Administered 2020-07-12: 5 mg via INTRAVENOUS

## 2020-07-12 MED ORDER — FENTANYL CITRATE (PF) 100 MCG/2ML IJ SOLN: 25.0000 ug | INTRAMUSCULAR | Status: DC | PRN

## 2020-07-12 MED ORDER — ONDANSETRON HCL 4 MG/2ML IJ SOLN
INTRAMUSCULAR | Status: AC
  Filled 2020-07-12: qty 2

## 2020-07-12 MED ORDER — PROPOFOL 10 MG/ML IV BOLUS
INTRAVENOUS | Status: DC | PRN
  Administered 2020-07-12: 150 mg via INTRAVENOUS
  Administered 2020-07-12: 50 mg via INTRAVENOUS

## 2020-07-12 MED ORDER — PROPOFOL 10 MG/ML IV BOLUS
INTRAVENOUS | Status: AC
  Filled 2020-07-12: qty 20

## 2020-07-12 MED ORDER — ISOSULFAN BLUE 1 % ~~LOC~~ SOLN
SUBCUTANEOUS | Status: AC
  Filled 2020-07-12: qty 10

## 2020-07-12 MED ORDER — OXYCODONE HCL 5 MG PO TABS
5.0000 mg | ORAL_TABLET | Freq: Once | ORAL | Status: AC | PRN
Start: 2020-07-12 — End: 2020-07-12
  Administered 2020-07-12: 5 mg via ORAL

## 2020-07-12 MED ORDER — ONDANSETRON HCL 4 MG/2ML IJ SOLN
INTRAMUSCULAR | Status: DC | PRN
  Administered 2020-07-12: 4 mg via INTRAVENOUS

## 2020-07-12 MED ORDER — PHENYLEPHRINE HCL (PRESSORS) 10 MG/ML IV SOLN
INTRAVENOUS | Status: DC | PRN
  Administered 2020-07-12 (×2): 100 ug via INTRAVENOUS

## 2020-07-12 MED ORDER — GABAPENTIN 300 MG PO CAPS
ORAL_CAPSULE | ORAL | Status: AC
  Administered 2020-07-12: 300 mg via ORAL
  Filled 2020-07-12: qty 1

## 2020-07-12 MED ORDER — DEXAMETHASONE SODIUM PHOSPHATE 10 MG/ML IJ SOLN
INTRAMUSCULAR | Status: AC
  Filled 2020-07-12: qty 1

## 2020-07-12 MED ORDER — FENTANYL CITRATE (PF) 100 MCG/2ML IJ SOLN
INTRAMUSCULAR | Status: AC
  Filled 2020-07-12: qty 2

## 2020-07-12 MED ORDER — LIDOCAINE HCL (CARDIAC) PF 100 MG/5ML IV SOSY
PREFILLED_SYRINGE | INTRAVENOUS | Status: DC | PRN
  Administered 2020-07-12: 80 mg via INTRAVENOUS

## 2020-07-12 MED ORDER — SUGAMMADEX SODIUM 200 MG/2ML IV SOLN
INTRAVENOUS | Status: DC | PRN
  Administered 2020-07-12: 200 mg via INTRAVENOUS

## 2020-07-12 MED ORDER — SODIUM CHLORIDE 0.9 % IV SOLN
INTRAVENOUS | Status: DC | PRN
  Administered 2020-07-12: 40 ug/min via INTRAVENOUS

## 2020-07-12 MED ORDER — DEXMEDETOMIDINE (PRECEDEX) IN NS 20 MCG/5ML (4 MCG/ML) IV SYRINGE
PREFILLED_SYRINGE | INTRAVENOUS | Status: DC | PRN
  Administered 2020-07-12 (×5): 4 ug via INTRAVENOUS

## 2020-07-12 MED ORDER — OXYCODONE HCL 5 MG PO TABS: 5.0000 mg | ORAL_TABLET | Freq: Four times a day (QID) | ORAL | 0 refills | Status: DC | PRN

## 2020-07-12 MED ORDER — BUPIVACAINE HCL (PF) 0.25 % IJ SOLN
INTRAMUSCULAR | Status: AC
  Filled 2020-07-12: qty 30

## 2020-07-12 MED ORDER — DEXMEDETOMIDINE (PRECEDEX) IN NS 20 MCG/5ML (4 MCG/ML) IV SYRINGE
PREFILLED_SYRINGE | INTRAVENOUS | Status: AC
  Filled 2020-07-12: qty 5

## 2020-07-12 MED ORDER — PROMETHAZINE HCL 25 MG/ML IJ SOLN: 6.2500 mg | INTRAMUSCULAR | Status: DC | PRN

## 2020-07-12 MED ORDER — MIDAZOLAM HCL 2 MG/2ML IJ SOLN
INTRAMUSCULAR | Status: AC
  Filled 2020-07-12: qty 2

## 2020-07-12 MED ORDER — ACETAMINOPHEN 500 MG PO TABS: 1000.0000 mg | ORAL_TABLET | Freq: Four times a day (QID) | ORAL | 0 refills | Status: AC | PRN

## 2020-07-12 MED ORDER — ISOSULFAN BLUE 1 % ~~LOC~~ SOLN
SUBCUTANEOUS | Status: DC | PRN
  Administered 2020-07-12: 10 mL via SUBCUTANEOUS

## 2020-07-12 MED ORDER — ACETAMINOPHEN 500 MG PO TABS
ORAL_TABLET | ORAL | Status: AC
  Administered 2020-07-12: 1000 mg via ORAL
  Filled 2020-07-12: qty 2

## 2020-07-12 MED ORDER — OXYCODONE HCL 5 MG/5ML PO SOLN: 5.0000 mg | Freq: Once | ORAL | Status: AC | PRN

## 2020-07-12 MED ORDER — ROCURONIUM BROMIDE 100 MG/10ML IV SOLN
INTRAVENOUS | Status: DC | PRN
  Administered 2020-07-12: 50 mg via INTRAVENOUS

## 2020-07-12 MED ORDER — DEXAMETHASONE SODIUM PHOSPHATE 10 MG/ML IJ SOLN
INTRAMUSCULAR | Status: DC | PRN
  Administered 2020-07-12: 5 mg via INTRAVENOUS

## 2020-07-12 MED ORDER — MIDAZOLAM HCL 2 MG/2ML IJ SOLN
INTRAMUSCULAR | Status: DC | PRN
  Administered 2020-07-12 (×2): 1 mg via INTRAVENOUS

## 2020-07-12 MED ORDER — LIDOCAINE-EPINEPHRINE 1 %-1:100000 IJ SOLN
INTRAMUSCULAR | Status: DC | PRN
  Administered 2020-07-12: 13 mL via INTRAMUSCULAR

## 2020-07-12 MED ORDER — TECHNETIUM TC 99M TILMANOCEPT KIT
0.8240 | PACK | Freq: Once | INTRAVENOUS | Status: AC | PRN
  Administered 2020-07-12: 0.824 via INTRADERMAL

## 2020-07-12 MED ORDER — EPHEDRINE 5 MG/ML INJ
INTRAVENOUS | Status: AC
  Filled 2020-07-12: qty 10

## 2020-07-12 MED ORDER — CHLORHEXIDINE GLUCONATE 0.12 % MT SOLN
OROMUCOSAL | Status: AC
  Administered 2020-07-12: 15 mL via OROMUCOSAL
  Filled 2020-07-12: qty 15

## 2020-07-12 MED ORDER — PROPOFOL 10 MG/ML IV BOLUS
INTRAVENOUS | Status: AC
  Filled 2020-07-12: qty 40

## 2020-07-12 MED ORDER — OXYCODONE HCL 5 MG PO TABS
ORAL_TABLET | ORAL | Status: AC
  Filled 2020-07-12: qty 1

## 2020-07-12 MED ORDER — CEFAZOLIN SODIUM-DEXTROSE 2-4 GM/100ML-% IV SOLN
INTRAVENOUS | Status: AC
  Filled 2020-07-12: qty 100

## 2020-07-12 MED ORDER — FENTANYL CITRATE (PF) 100 MCG/2ML IJ SOLN
INTRAMUSCULAR | Status: DC | PRN
  Administered 2020-07-12: 50 ug via INTRAVENOUS
  Administered 2020-07-12: 25 ug via INTRAVENOUS
  Administered 2020-07-12: 50 ug via INTRAVENOUS

## 2020-07-12 SURGICAL SUPPLY — 58 items
ADH SKN CLS APL DERMABOND .7 (GAUZE/BANDAGES/DRESSINGS) ×2
APL PRP STRL LF DISP 70% ISPRP (MISCELLANEOUS) ×1
BINDER BREAST LRG (GAUZE/BANDAGES/DRESSINGS) IMPLANT
BINDER BREAST MEDIUM (GAUZE/BANDAGES/DRESSINGS) IMPLANT
BINDER BREAST XLRG (GAUZE/BANDAGES/DRESSINGS) ×2 IMPLANT
BINDER BREAST XXLRG (GAUZE/BANDAGES/DRESSINGS) IMPLANT
BLADE SURG 15 STRL LF DISP TIS (BLADE) ×2 IMPLANT
BLADE SURG 15 STRL SS (BLADE) ×4
BULB RESERV EVAC DRAIN JP 100C (MISCELLANEOUS) ×2 IMPLANT
CANISTER SUCT 1200ML W/VALVE (MISCELLANEOUS) IMPLANT
CHLORAPREP W/TINT 26 (MISCELLANEOUS) ×2 IMPLANT
CLIP VESOCCLUDE SM WIDE 6/CT (CLIP) ×2 IMPLANT
CNTNR SPEC 2.5X3XGRAD LEK (MISCELLANEOUS)
CONT SPEC 4OZ STER OR WHT (MISCELLANEOUS)
CONT SPEC 4OZ STRL OR WHT (MISCELLANEOUS)
CONTAINER SPEC 2.5X3XGRAD LEK (MISCELLANEOUS) IMPLANT
COVER WAND RF STERILE (DRAPES) ×2 IMPLANT
DERMABOND ADVANCED (GAUZE/BANDAGES/DRESSINGS) ×2
DERMABOND ADVANCED .7 DNX12 (GAUZE/BANDAGES/DRESSINGS) ×2 IMPLANT
DEVICE DSSCT PLSMBLD 3.0S LGHT (MISCELLANEOUS) IMPLANT
DEVICE DUBIN SPECIMEN MAMMOGRA (MISCELLANEOUS) ×10 IMPLANT
DRAIN CHANNEL JP 19F (MISCELLANEOUS) ×2 IMPLANT
DRAPE C-ARM XRAY 36X54 (DRAPES) ×2 IMPLANT
DRAPE LAPAROTOMY 77X122 PED (DRAPES) ×2 IMPLANT
DRSG GAUZE FLUFF 36X18 (GAUZE/BANDAGES/DRESSINGS) ×2 IMPLANT
ELECT CAUTERY BLADE TIP 2.5 (TIP) ×2
ELECT REM PT RETURN 9FT ADLT (ELECTROSURGICAL) ×2
ELECTRODE CAUTERY BLDE TIP 2.5 (TIP) ×1 IMPLANT
ELECTRODE REM PT RTRN 9FT ADLT (ELECTROSURGICAL) ×1 IMPLANT
GAUZE 4X4 16PLY RFD (DISPOSABLE) ×2 IMPLANT
GLOVE SURG ENC MOIS LTX SZ6.5 (GLOVE) ×2 IMPLANT
GLOVE SURG UNDER LTX SZ7 (GLOVE) ×4 IMPLANT
GOWN STRL REUS W/ TWL LRG LVL3 (GOWN DISPOSABLE) ×2 IMPLANT
GOWN STRL REUS W/TWL LRG LVL3 (GOWN DISPOSABLE) ×4
KIT MARKER MARGIN INK (KITS) ×2 IMPLANT
KIT TURNOVER KIT A (KITS) ×2 IMPLANT
LABEL OR SOLS (LABEL) ×2 IMPLANT
MANIFOLD NEPTUNE II (INSTRUMENTS) ×2 IMPLANT
MARKER MARGIN CORRECT CLIP (MARKER) IMPLANT
NEEDLE HYPO 25X1 1.5 SAFETY (NEEDLE) ×2 IMPLANT
PACK BASIN MINOR ARMC (MISCELLANEOUS) ×2 IMPLANT
PLASMABLADE 3.0S W/LIGHT (MISCELLANEOUS)
SET LOCALIZER 20 PROBE US (MISCELLANEOUS) ×4 IMPLANT
SLEVE PROBE SENORX GAMMA FIND (MISCELLANEOUS) ×2 IMPLANT
SPONGE LAP 18X18 RF (DISPOSABLE) ×2 IMPLANT
STRIP CLOSURE SKIN 1/2X4 (GAUZE/BANDAGES/DRESSINGS) ×4 IMPLANT
SUT ETHILON 3-0 FS-10 30 BLK (SUTURE) ×2
SUT MNCRL 4-0 (SUTURE) ×2
SUT MNCRL 4-0 27XMFL (SUTURE) ×1
SUT SILK 2 0 SH (SUTURE) IMPLANT
SUT VIC AB 3-0 SH 27 (SUTURE) ×4
SUT VIC AB 3-0 SH 27X BRD (SUTURE) ×2 IMPLANT
SUTURE EHLN 3-0 FS-10 30 BLK (SUTURE) ×1 IMPLANT
SUTURE MNCRL 4-0 27XMF (SUTURE) ×1 IMPLANT
SYR 10ML LL (SYRINGE) ×2 IMPLANT
SYR BULB IRRIG 60ML STRL (SYRINGE) ×2 IMPLANT
TUBING CONNECTING 10 (TUBING) IMPLANT
WATER STERILE IRR 1000ML POUR (IV SOLUTION) ×2 IMPLANT

## 2020-07-12 NOTE — Op Note (Signed)
Sentinel Node Biopsy Synoptic Operative Report  Operation performed with curative intent:No  Tracer(s) used to identify sentinel nodes in the upfront surgery (non-neoadjuvant) setting (select all that apply):Dye and Radioactive Tracer  Tracer(s) used to identify sentinel nodes in the neoadjuvant setting (select all that apply):N/A  All nodes (colored or non-colored) present at the end of a dye-filled lymphatic channel were removed:Yes   All significantly radioactive nodes were removed:Yes  All palpable suspicious nodes were removed:N/A  Biopsy-proven positive nodes marked with clips prior to chemotherapy were identified and removed:N/A   Breast Lumpectomy (x3) with Sentinal Node Biopsy Procedure Note  Indications: This patient presents with a history of left breast cancer.  There are actually 3 separate RF ID chips placed to delineate the margins identified on MRI and confirmed by biopsy.  She strongly desired breast conservation and therefore multiple lumpectomies to encompass the entire area were advised.  Sentinel lymph node biopsy was also advised.  The risks of the procedures were discussed with the patient and she agreed to proceed.  Pre-operative Diagnosis: left breast cancer  Post-operative Diagnosis: left breast cancer  Surgeon: Fredirick Maudlin   Assistants: None  Anesthesia: General endotracheal anesthesia  Procedure Details  The patient was seen in the Holding Room. The risks, benefits, complications, treatment options, and expected outcomes were discussed with the patient. The possibilities of reaction to medication, pulmonary aspiration, bleeding, infection, the need for additional procedures, failure to diagnose a condition, and creating a complication requiring transfusion or operation were discussed with the patient. The patient concurred with the proposed plan, giving informed consent. The site of surgery properly noted/marked. The patient was taken to Operating  Room, identified as [place], and the procedure verified as lumpectomy. A Time Out was held and the above information confirmed.  After induction of anesthesia, the node seeker was used to map out the location of highest counts in the axilla.  This area was marked.  Methylene blue dye was then injected circumferentially around the patient's areola and massaged in place for 5 minutes.  Then, the left breast and chest were prepped and draped in standard fashion.  The skin and subcutaneous tissue were infiltrated with a one-to-one mixture of 0.25% bupivacaine and 1% lidocaine with epinephrine.  A curvilinear incision was made in the left axilla.  The gamma probe was used to guide the dissection.  As we tracked closer to the area of highest counts, a single blue lymphatic was appreciated.  This was followed to its terminus and a blue lymph node was excised.  The gamma probe was applied and counts were 3786.  Further exploration of the axilla did not reveal any additional blue lymphatic channels and the background counts were less than 300.  Therefore a single sentinel node was identified and sent as a specimen.  The wound was irrigated and hemostasis achieved.  The wound was reapproximated with deep dermal 3-0 Vicryl and running subcuticular Monocryl.  We then turned our attention to performing the multiple lumpectomies.   The RF ID localizer was applied to the breast and the first chip identified.  The skin and subcutaneous tissues overlying the area were injected with a one-to-one mixture of 0.25% bupivacaine and 1% lidocaine with epinephrine.  A curvilinear incision was made overlying the chip.  The tissue was grasped with an Allis clamp and circumferentially dissected away from the remaining tissue.  Once the first lumpectomy was removed, it was inked and a mammogram taken.  This confirmed the presence of the  coil clip and the RF ID chip.  The specimen was then sent to pathology for gross margins and the  images sent to mammography.  We then used the RF ID localizer to identify the next chip.  The tissue adjacent was grasped with an Allis clamp and we continued to dissect circumferentially around the chip.  Once it was completely excised, it was inked and a mammogram obtained.  This specimen included the dog bone clip and the X shaped clip, as well as the RF ID chip.  The specimen was sent to pathology for gross margins and the images sent to mammography for evaluation.  The third RF ID chip was then localized and we proceeded just as we had with the prior lesions.  Once the specimen was out, it was again inked and a mammogram obtained.  The vision clip was appreciated, but the RF ID chip was not seen.  At this point, the localizer began to randomly "localized" despite not being anywhere near the surgical site.  When it was applied to the surgical site, no reading was obtained.  We spent a considerable amount of time taking additional margins around the area to try and get the chip out.  None of the additional samples containing the RF ID chip.  A chest x-ray was obtained but did not show any retained chips.  We got multiple views with the C arm, none of which demonstrated the presence of the chip.  At that point, we felt that the chip was no longer within the patient's body and had potentially been aspirated by the Neptune suction machine during irrigation.    Due to the very large cavity remaining from the resection of the multiple specimens, I elected to place a drain.  A #19 Blake drain was placed into the cavity and brought out through a separate stab incision.  It was sutured in place with 3-0 nylon.  The wound was then closed with a deep dermal 3-0 Vicryl and a running subcuticular Monocryl.  The skin of both the breast and axillary incision were cleaned.  Dermabond and Steri-Strips were applied.  A drain sponge and dressing were applied to the drain exit site.  Gauze fluffs and a breast binder were used as  a final dressing.  The patient was then awakened, extubated, and taken to the postanesthesia care unit in good condition.   At the end of the operation, all sponge, instrument, and needle counts were correct.  Findings: grossly clear surgical margins  Estimated Blood Loss:  Minimal         Drains: #19 Blake drain in the left breast lumpectomy cavity         Total IV Fluids: See anesthesia record         Specimens: breast mass             Complications:  None; patient tolerated the procedure well.         Disposition: To the recovery unit with subsequent discharge to home.         Condition: Stable

## 2020-07-12 NOTE — Anesthesia Procedure Notes (Signed)
Procedure Name: Intubation Date/Time: 07/12/2020 10:27 AM Performed by: Jerrye Noble, CRNA Pre-anesthesia Checklist: Patient identified, Emergency Drugs available, Suction available and Patient being monitored Patient Re-evaluated:Patient Re-evaluated prior to induction Oxygen Delivery Method: Circle system utilized Preoxygenation: Pre-oxygenation with 100% oxygen Induction Type: IV induction Ventilation: Mask ventilation without difficulty Laryngoscope Size: Mac and 3 Grade View: Grade II Tube type: Oral Tube size: 7.0 mm Number of attempts: 1 Airway Equipment and Method: Stylet and Oral airway Placement Confirmation: ETT inserted through vocal cords under direct vision,  positive ETCO2 and breath sounds checked- equal and bilateral Secured at: 21 cm Tube secured with: Tape Dental Injury: Teeth and Oropharynx as per pre-operative assessment

## 2020-07-12 NOTE — Discharge Instructions (Signed)

## 2020-07-12 NOTE — Anesthesia Preprocedure Evaluation (Signed)
Anesthesia Evaluation  Patient identified by MRN, date of birth, ID band Patient awake    Reviewed: Allergy & Precautions, H&P , NPO status , Patient's Chart, lab work & pertinent test results  History of Anesthesia Complications Negative for: history of anesthetic complications  Airway Mallampati: II  TM Distance: >3 FB     Dental  (+) Teeth Intact   Pulmonary neg sleep apnea, neg COPD, former smoker,    breath sounds clear to auscultation       Cardiovascular hypertension, (-) angina(-) Past MI and (-) Cardiac Stents (-) dysrhythmias  Rhythm:regular Rate:Normal     Neuro/Psych  Headaches, negative psych ROS   GI/Hepatic Neg liver ROS, hiatal hernia, GERD  ,  Endo/Other  diabetes  Renal/GU      Musculoskeletal   Abdominal   Peds  Hematology negative hematology ROS (+)   Anesthesia Other Findings Past Medical History: 06/06/2020: Carcinoma of upper-outer quadrant of left breast in female,  estrogen receptor positive ([place]) No date: Diabetes mellitus without complication (HCC)     Comment:  diet controlled No date: Family history of breast cancer No date: Family history of ovarian cancer No date: GERD (gastroesophageal reflux disease) No date: History of hiatal hernia No date: Hyperlipidemia No date: Hypertension No date: Migraines  Past Surgical History: 05/29/2020: BREAST BIOPSY; Left     Comment:  affirm bx "X" clp-invasive 05/29/2020: BREAST BIOPSY; Left     Comment:  Affirm bx"Coil" clip-invasive 2001: BREAST BIOPSY; Right     Comment:  excisional biopsy 06/19/1999: BREAST EXCISIONAL BIOPSY; Right     Comment:  neg No date: BREAST LUMPECTOMY 02/15/2009: COLONOSCOPY 07/12/2020: EXCISION OF BREAST BIOPSY; Left     Comment:  tag placement 07/10/20 vison tag [id number]; hourglass tag               [id number]; cylinder tag [phone] No date: TUBAL LIGATION  BMI    Body Mass Index: 26.61 kg/m       Reproductive/Obstetrics negative OB ROS                             Anesthesia Physical Anesthesia Plan  ASA: II  Anesthesia Plan: General ETT   Post-op Pain Management:    Induction:   PONV Risk Score and Plan: Ondansetron, Dexamethasone, Midazolam and Treatment may vary due to age or medical condition  Airway Management Planned:   Additional Equipment:   Intra-op Plan:   Post-operative Plan:   Informed Consent: I have reviewed the patients History and Physical, chart, labs and discussed the procedure including the risks, benefits and alternatives for the proposed anesthesia with the patient or authorized representative who has indicated his/her understanding and acceptance.     Dental Advisory Given  Plan Discussed with: Anesthesiologist, CRNA and Surgeon  Anesthesia Plan Comments:         Anesthesia Quick Evaluation

## 2020-07-12 NOTE — Interval H&P Note (Signed)
History and Physical Interval Note:  07/12/2020 10:14 AM  Today's Vitals   07/12/20 0920  BP: (!) 152/88  Pulse: 67  Resp: 18  Temp: 97.9 F (36.6 C)  TempSrc: Tympanic  SpO2: 100%  Weight: 70.3 kg  Height: 5\' 4"  (1.626 m)  PainSc: 0-No pain   Body mass index is 26.61 kg/m. Gen: A&O x 3 CV: RRR Pulm: normal work of breathing on RA Chest: Left breast marked  Candice Bowers  has presented today for surgery, with the diagnosis of left breast cancer.  The various methods of treatment have been discussed with the patient and family. After consideration of risks, benefits and other options for treatment, the patient has consented to  Procedure(s): [place] (Left) as a surgical intervention.  The patient's history has been reviewed, patient examined, no change in status, stable for surgery.  I have reviewed the patient's chart and labs.  Questions were answered to the patient's satisfaction.     Fredirick Maudlin

## 2020-07-12 NOTE — Transfer of Care (Signed)
Immediate Anesthesia Transfer of Care Note  Patient: Candice Bowers  Procedure(s) Performed: BREAST [place] SENTINEL LYMPH NODE BIOPSY (Left Breast)  Patient Location: PACU  Anesthesia Type:General  Level of Consciousness: drowsy  Airway & Oxygen Therapy: Patient Spontanous Breathing and Patient connected to face mask oxygen  Post-op Assessment: Report given to RN and Post -op Vital signs reviewed and stable  Post vital signs: Reviewed and stable  Last Vitals:  Vitals Value Taken Time  BP 133/77 07/12/20 1410  Temp    Pulse 78 07/12/20 1413  Resp 13 07/12/20 1413  SpO2 99 % 07/12/20 1413  Vitals shown include unvalidated device data.  Last Pain:  Vitals:   07/12/20 0920  TempSrc: Tympanic  PainSc: 0-No pain         Complications: No complications documented.

## 2020-07-15 ENCOUNTER — Encounter: Payer: Self-pay | Admitting: General Surgery

## 2020-07-15 NOTE — Anesthesia Postprocedure Evaluation (Signed)
Anesthesia Post Note  Patient: Candice Bowers  Procedure(s) Performed: BREAST LUMPECTOMY,RADIO FREQ LOCALIZER,AXILLARY SENTINEL LYMPH NODE BIOPSY (Left Breast)  Patient location during evaluation: PACU Anesthesia Type: General Level of consciousness: awake and alert and oriented Pain management: pain level controlled Vital Signs Assessment: post-procedure vital signs reviewed and stable Respiratory status: spontaneous breathing, nonlabored ventilation and respiratory function stable Cardiovascular status: blood pressure returned to baseline and stable Postop Assessment: no signs of nausea or vomiting Anesthetic complications: no   No complications documented.   Last Vitals:  Vitals:   07/12/20 1511 07/12/20 1545  BP: (!) 150/85 (!) 148/76  Pulse: 87 76  Resp: 16 15  Temp: (!) 35.9 C   SpO2: 97% 96%    Last Pain:  Vitals:   07/12/20 1511  TempSrc: Tympanic  PainSc: 3                  Gautam Langhorst

## 2020-07-17 LAB — SURGICAL PATHOLOGY

## 2020-07-19 ENCOUNTER — Telehealth: Payer: Self-pay

## 2020-07-19 ENCOUNTER — Telehealth: Payer: Self-pay | Admitting: General Surgery

## 2020-07-19 NOTE — Telephone Encounter (Signed)
Message left for the patient to call back to schedule a post op visit with Edison Simon for next week.

## 2020-07-19 NOTE — Telephone Encounter (Signed)
-----   Message from Fredirick Maudlin, MD sent at 07/19/2020 12:44 PM EDT ----- Regarding: PA visit This patient has a drain in place.  We make an appointment to see Thedore Mins next week, while I am out, to see if the drain can be removed? Thanks. [place]

## 2020-07-19 NOTE — Telephone Encounter (Signed)
I discussed the patient's pathology with her and let her know that there was no residual tumor found and her margins were clear.  She says that she is doing okay, just having some pain which responds well to the medications she was prescribed.  She asked me how long she would need to keep her drain in place.  She says it is putting out roughly half an ounce per day.  I told her we would make an appointment for her to see Otho Ket, our PA and she should bring her record of the output with her that day.  If it is low enough, he can remove the drain for her.  She is scheduled to follow-up with me in a couple of weeks.

## 2020-07-25 ENCOUNTER — Encounter: Payer: Self-pay | Admitting: Physician Assistant

## 2020-07-25 ENCOUNTER — Other Ambulatory Visit: Payer: Self-pay

## 2020-07-25 ENCOUNTER — Ambulatory Visit (INDEPENDENT_AMBULATORY_CARE_PROVIDER_SITE_OTHER): Payer: Medicare HMO | Admitting: Physician Assistant

## 2020-07-25 VITALS — BP 129/84 | HR 61 | Temp 98.4°F | Ht 64.0 in | Wt 153.0 lb

## 2020-07-25 DIAGNOSIS — Z09 Encounter for follow-up examination after completed treatment for conditions other than malignant neoplasm: Secondary | ICD-10-CM

## 2020-07-25 DIAGNOSIS — C50412 Malignant neoplasm of upper-outer quadrant of left female breast: Secondary | ICD-10-CM

## 2020-07-25 DIAGNOSIS — Z170 Estrogen receptor positive status [ER+]: Secondary | ICD-10-CM

## 2020-07-25 MED ORDER — OXYCODONE HCL 5 MG PO TABS: 5.0000 mg | ORAL_TABLET | Freq: Four times a day (QID) | ORAL | 0 refills | Status: DC | PRN

## 2020-07-25 NOTE — Progress Notes (Signed)
 [facility] POST-[facility] OFFICE VISIT  07/25/2020  HPI: Candice Bowers is a 65 y.o. female 13 days s/p left breast lumpectomy x3 and SLNB with Dr Celine Ahr  She is doing well She has soreness related to the drain site No fever, chills, nausea, emesis Her incisions are healing well Drain has been having <1 oz daily of output which is serosanguinous No other complaints  Vital signs: BP 129/84   Pulse 61   Temp 98.4 F (36.9 C)   Ht 5\' 4"  (1.626 m)   Wt 153 lb (69.4 kg)   SpO2 97%   BMI 26.26 kg/m    Physical Exam: Constitutional: Well appearing female, NAD Skin: Chaperone present, left axillary and 3 o'clock breast incisions are healed well, no erythema or drainage. Drain present with serosanguinous output (I did remove this)  Assessment/Plan: This is a 65 y.o. female 13 days s/p left breast lumpectomy x3 and SLNB   - Drain removed today; placed occlusive dressing. Encouraged to leave this in place for 48 hours. Keep covered if noticing drainage  - Pain control prn; I will provide her a small refill of her narcotic medications   - She will rtc next week as scheduled with Dr Celine Ahr  -- Edison Simon, PA-C  [facility] 07/25/2020, 2:26 PM [phone] M-F: 7am - 4pm

## 2020-07-25 NOTE — Patient Instructions (Signed)
Please keep the dressing over the area for the next few days and or until it heals completely. Pick up your medication at the pharmacy. Please keep your appointment with Dr.Cannon next week.

## 2020-08-01 ENCOUNTER — Encounter: Payer: Self-pay | Admitting: General Surgery

## 2020-08-01 ENCOUNTER — Ambulatory Visit (INDEPENDENT_AMBULATORY_CARE_PROVIDER_SITE_OTHER): Payer: Medicare HMO | Admitting: General Surgery

## 2020-08-01 ENCOUNTER — Other Ambulatory Visit: Payer: Self-pay

## 2020-08-01 VITALS — BP 141/85 | HR 56 | Temp 98.4°F | Ht 64.0 in | Wt 157.0 lb

## 2020-08-01 DIAGNOSIS — Z170 Estrogen receptor positive status [ER+]: Secondary | ICD-10-CM

## 2020-08-01 DIAGNOSIS — C50412 Malignant neoplasm of upper-outer quadrant of left female breast: Secondary | ICD-10-CM

## 2020-08-01 NOTE — Patient Instructions (Addendum)
Continue with range of motion exercises.   Follow up with Dr Rogue Bussing for further treatment if needed. He will order any future mammograms.  Call the [facility] if you do not hear from them in the next week or 2.   Let us know if you would like a referral to speak with Plastic Surgery.  Follow-up with our office as needed.  Please call and ask to speak with a nurse if you develop questions or concerns.

## 2020-08-01 NOTE — Progress Notes (Signed)
[place] is here today for postoperative visit.  She is a 65 year old woman who underwent multiple radiofrequency guided lumpectomies and axillary sentinel lymph node biopsy for biopsy-proven right breast cancer.  Final pathology as follows:  SURGICAL PATHOLOGY  CASE: [id number]  PATIENT: Candice Bowers  Surgical Pathology Report      Specimen Submitted:  A. RFID chip [id number], left breast  B. Breast, left, 2nd lumpectomy  C. RFID chip [id number], left breast  D. RFID chip [id number], left breast  E. Breast, left, additional margins  F. Sentinel node 1   Clinical History: Left breast cancer       DIAGNOSIS:  A. LEFT BREAST, RFID CHIP [id number]; LUMPECTOMY:  - ATYPICAL DUCTAL HYPERPLASIA.  - FIBROADENOMATOID CHANGES.  - NODULAR ADENOSIS.  - FIBROCYSTIC CHANGES WITH APOCRINE METAPLASIA.  - PRIOR BIOPSY SITE CHANGES.  - NEGATIVE FOR MALIGNANCY.   B. LEFT BREAST, "SECOND LUMPECTOMY"; LUMPECTOMY:  - SCLEROSING ADENOSIS.  - PRIOR BIOPSY SITE CHANGES.  - NEGATIVE FOR MALIGNANCY.   C. LEFT BREAST, RFID CHIP [id number]; LUMPECTOMY:  - DUCTAL CARCINOMA IN SITU, FOCAL, LESS THAN 0.1 CM FROM THE BLACK  (POSTERIOR) MARGIN.  - TRANSECTED BIOPSY SITE.  - NEGATIVE FOR INVASIVE CARCINOMA.  - SEE CANCER SUMMARY BELOW.   D. LEFT BREAST, RFID CHIP [id number]; LUMPECTOMY:  - MINUTE FOCUS SUSPICIOUS FOR DCIS, WITH EXTENSIVE THERMAL ARTIFACT,  PRESENT AT BLUE (INFERIOR) MARGIN.  - TRANSECTED BIOPSY SITE.  - NEGATIVE FOR INVASIVE CARCINOMA.  - SEE CANCER SUMMARY BELOW.   E. LEFT BREAST, ADDITIONAL MARGINS; EXCISION:  - FIBROCYSTIC AND FIBROADENOMATOID CHANGES.  - SCLEROSING ADENOSIS.  - PRIOR BIOPSY SITE CHANGES.  - NEGATIVE FOR MALIGNANCY.   F. SENTINEL LYMPH NODE; EXCISIONAL BIOPSY:  - TWO LYMPH NODES, NEGATIVE FOR MALIGNANCY (0/2).   Comment:  As there is no residual invasive carcinoma in these lumpectomy  specimens, the cancer summary below includes information from the  previous biopsy case  ([id number]). Ductal carcinoma in situ is present  on ink, with thermal artifact. However, it is not possible to determine  how these 5 lumpectomy specimens relate to one another. It is unclear  whether the involved inked surface is true resection margin, and if so,  which margin it might be. Clinical correlation is recommended.    CANCER CASE SUMMARY: INVASIVE CARCINOMA OF THE BREAST  Standard(s): AJCC-UICC 8   SPECIMEN  Procedure: Lumpectomy  Specimen Laterality: Left   TUMOR  Histologic Type: Invasive mammary carcinoma, no special type  Histologic Grade (Nottingham Histologic Score)            Glandular (Acinar)/Tubular Differentiation: 3            Nuclear Pleomorphism: 3            Mitotic Rate: 2            Overall Grade: 3  Tumor Size: Multifocal, 5 mm and 4 mm  Ductal Carcinoma In Situ (DCIS): Present, high-grade with comedonecrosis  Lymphovascular Invasion: Not definitively identified  Treatment Effect in the Breast: No known presurgical treatment   MARGINS  Margin Status for Invasive Carcinoma: All margins negative for invasive  carcinoma            Distance from closest margin: Unable to be  determined, see comment            Specify closest margin: Unable to be determined,  see comment   Margin Status for DCIS: Unable to be determined, see comment    REGIONAL LYMPH NODES  Regional Lymph Node Status: All regional lymph nodes negative for tumor            Total Number of Lymph Nodes Examined (sentinel and  non-sentinel): 2            Number of Sentinel Nodes Examined: 2   DISTANT METASTASIS  Distant Site(s) Involved, if applicable: Not applicable   PATHOLOGIC STAGE CLASSIFICATION (pTNM, AJCC 8th Edition):  TNM Descriptors: M (multifocal)  pT1a  Regional Lymph Nodes Modifier: SN  pN0  pM - Not applicable   SPECIAL STUDIES  Breast Biomarker Testing Performed on  Previous Biopsy: [id number]  Estrogen Receptor (ER) Status: POSITIVE      Percentage of cells with nuclear positivity: Greater than 90%      Average intensity of staining: Strong  Progesterone Receptor (PgR) Status: NEGATIVE (LESS THAN 1%)      Internal control cells present and stain as expected  HER2 (by immunohistochemistry): NEGATIVE (Score 0)   Of note, one of the RF ID chips was not retrieved at the time of surgery.  Multiple radiographic images were performed and it was not identified as being within the patient's tissues; the working theory at this time is that it was aspirated into the suction canister of the [place], which is inaccessible.  Her drain was removed last week.  Today, she reports that she is doing well.  She has some hypersensitivity to the skin, but denies any significant pain.  No erythema, induration, or drainage from either the axillary or the breast site.  Today's Vitals   08/01/20 1032  BP: (!) 141/85  Pulse: (!) 56  Temp: 98.4 F (36.9 C)  SpO2: 98%  Weight: 157 lb (71.2 kg)  Height: '5\' 4"'  (1.626 m)   Body mass index is 26.95 kg/m. Focused examination demonstrates that the axillary site has healed nicely, as has the breast excision site.  There is some moderate asymmetry between the right and the left, secondary to the significant amount of tissue removed.  No concern for infection, seroma, or hematoma.  Impression and plan: This is a 65 year old woman with right breast cancer.  She had multiple lumpectomies, as well as a sentinel node biopsy performed.  She is healing well from her procedure.  She needs to follow-up with Dr. Rogue Bussing and Dr. Donella Stade for further treatment.  Radiology recommended a repeat mammogram in about 6 months to confirm that the RF ID chip is definitively not within the patient's body.  I will defer further evaluation and management to the radiation and medical oncology teams.  I will see her on an as-needed basis.

## 2020-08-14 ENCOUNTER — Inpatient Hospital Stay: Payer: Medicare HMO | Attending: Internal Medicine | Admitting: Internal Medicine

## 2020-08-14 ENCOUNTER — Encounter: Payer: Self-pay | Admitting: Internal Medicine

## 2020-08-14 ENCOUNTER — Other Ambulatory Visit: Payer: Self-pay

## 2020-08-14 DIAGNOSIS — Z170 Estrogen receptor positive status [ER+]: Secondary | ICD-10-CM | POA: Insufficient documentation

## 2020-08-14 DIAGNOSIS — Z79899 Other long term (current) drug therapy: Secondary | ICD-10-CM | POA: Diagnosis not present

## 2020-08-14 DIAGNOSIS — Z8041 Family history of malignant neoplasm of ovary: Secondary | ICD-10-CM | POA: Insufficient documentation

## 2020-08-14 DIAGNOSIS — Z803 Family history of malignant neoplasm of breast: Secondary | ICD-10-CM | POA: Insufficient documentation

## 2020-08-14 DIAGNOSIS — Z87891 Personal history of nicotine dependence: Secondary | ICD-10-CM | POA: Insufficient documentation

## 2020-08-14 DIAGNOSIS — C50412 Malignant neoplasm of upper-outer quadrant of left female breast: Secondary | ICD-10-CM | POA: Diagnosis present

## 2020-08-14 NOTE — Assessment & Plan Note (Addendum)
#  Stage I breast cancer- pT1a [5 mm; 4 mm] grade 3; pN0-postlumpectomy no residual malignancy noted.  ER positive PR negative; HER2 negative.  #Discussed the role of adjuvant radiation.  Also discussed the role of adjuvant antihormone therapy post radiation.  We will get Oncotype-given the grade 3 tumor; PR negative.  Discussed the possible role for chemotherapy if patient is high risk on Oncotype.  # Genetics:DECLINED  # DISPOSITION: # referral to Dr.Chrystal re: breast cancer # follow up TBD- Dr.B

## 2020-08-14 NOTE — Progress Notes (Signed)
one [place] NOTE  Patient Care Team: Sofie Hartigan, MD as PCP - General (Family Medicine) Theodore Demark, RN as Oncology Nurse Navigator  CHIEF COMPLAINTS/PURPOSE OF CONSULTATION: Breast cancer  #  Oncology History Overview Note  # JAN 2022-  0.5 cm irregular UPPER-OUTER LEFT breast mass with calcifications extending 1.5 cm anteroinferiorly, suspicious for malignancy. Ultrasound-guided biopsy of the mass is recommended. Stereotactic guided biopsy of the anterior/inferior most calcifications extending from this mass is also recommended. 2. Indeterminate 0.4 cm group of UPPER-OUTER LEFT breast calcifications, located 2.5 cm from the irregular mass above. 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 This was found to be concordant by Dr. Zerita Boers.   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 This was found to be concordant by Dr. Zerita Boers.   Pathology revealed COLUMNAR CELL CHANGE WITH ASSOCIATED CALCIFICATIONS, NEGATIVE FOR ATYPIA AND MALIGNANCY of the LEFT breast calcifications, medial upper outer quadrant, coil clip. This was found to be concordant by Dr. Zerita Boers.   Of note, the area of invasive mammary carcinoma spans at least 1.9 cm mammographically.  # BREAST, LEFT 2:00 7 CM FN; ULTRASOUND-GUIDED BIOPSY (VISION CLIP):  - INVASIVE MAMMARY CARCINOMA, NO SPECIAL TYPE.  Size of invasive carcinoma: 5 mm in this sample  Histologic grade of invasive carcinoma: Grade 3                       Glandular/tubular  differentiation score: 3                       Nuclear pleomorphism score: 3                       Mitotic rate score: 2                       Total score: 8  Ductal carcinoma in situ: Present, high-grade with calcification  Lymphovascular invasion: Not identified   B.  BREAST WITH CALCIFICATIONS, LEFT LATERAL UPPER OUTER QUADRANT;  STEREOTACTIC BIOPSY (X SHAPED CLIP):  - INVASIVE MAMMARY CARCINOMA, NO SPECIAL TYPE.  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  - CALCIFICATIONS ASSOCIATED WITH DCIS AND SCLEROSING ADENOSIS.  - AREAS SUSPICIOUS FOR LYMPHOVASCULAR INVASION.  Size of invasive carcinoma: 4 mm in this sample  Histologic grade of invasive carcinoma: Grade 3                       Glandular/tubular differentiation score: 3                       Nuclear pleomorphism score: 3                       Mitotic rate score: 2                       Total score: 8   C.  BREAST WITH CALCIFICATIONS, LEFT MEDIAL UPPER OUTER QUADRANT;  STEREOTACTIC BIOPSY (COIL-SHAPED CLIP):  - COLUMNAR CELL  CHANGE WITH ASSOCIATED CALCIFICATIONS.  - NEGATIVE FOR ATYPIA AND MALIGNANCY. BIOPSY- ER > 90%; PR-NEG; Her-2 NEG -----------------------------------------------------------------  TNM Descriptors: M (multifocal)  pT1a  Regional Lymph Nodes Modifier: SN  pN0  pM - Not applicable    Carcinoma of upper-outer quadrant of left breast in female, estrogen receptor positive ([place])  06/06/2020 Initial Diagnosis   Carcinoma of upper-outer quadrant of left breast in female, estrogen receptor positive ([place])   06/06/2020 Cancer Staging   Staging form: Breast, AJCC 8th Edition - Clinical: Stage IB (cT1b, cN0, cM0, G3, ER+, PR-, HER2-) - Signed by Cammie Sickle, MD on 06/06/2020 Stage prefix: Initial diagnosis      HISTORY OF PRESENTING ILLNESS:  [place] 65 y.o.  female newly diagnosed breast cancer is here s/p lumpectomy.  Patient did well postsurgery.   Denies any postoperative complications.  Denies any fevers or chills.   Review of Systems  Constitutional: Negative for chills, diaphoresis, fever, malaise/fatigue and weight loss.  HENT: Negative for nosebleeds and sore throat.   Eyes: Negative for double vision.  Respiratory: Negative for cough, hemoptysis, sputum production, shortness of breath and wheezing.   Cardiovascular: Negative for chest pain, palpitations, orthopnea and leg swelling.  Gastrointestinal: Negative for abdominal pain, blood in stool, constipation, diarrhea, heartburn, melena, nausea and vomiting.  Genitourinary: Negative for dysuria, frequency and urgency.  Musculoskeletal: Negative for back pain and joint pain.  Skin: Negative.  Negative for itching and rash.  Neurological: Negative for dizziness, tingling, focal weakness, weakness and headaches.  Endo/Heme/Allergies: Does not bruise/bleed easily.  Psychiatric/Behavioral: Negative for depression. The patient is not nervous/anxious and does not have insomnia.      MEDICAL HISTORY:  Past Medical History:  Diagnosis Date  . Carcinoma of upper-outer quadrant of left breast in female, estrogen receptor positive ([place]) 06/06/2020  . Diabetes mellitus without complication (HCC)    diet controlled  . Family history of breast cancer   . Family history of ovarian cancer   . GERD (gastroesophageal reflux disease)   . History of hiatal hernia   . Hyperlipidemia   . Hypertension   . Migraines     SURGICAL HISTORY: Past Surgical History:  Procedure Laterality Date  . BREAST BIOPSY Left 05/29/2020   affirm bx "X" clp-invasive  . BREAST BIOPSY Left 05/29/2020   Affirm bx"Coil" clip-invasive  . BREAST BIOPSY Right 2001   excisional biopsy  . BREAST EXCISIONAL BIOPSY Right 06/19/1999   neg  . BREAST LUMPECTOMY    . BREAST LUMPECTOMY,RADIO FREQ LOCALIZER,AXILLARY SENTINEL LYMPH NODE BIOPSY Left 07/12/2020   Procedure: BREAST LUMPECTOMY,RADIO FREQ LOCALIZER,AXILLARY  SENTINEL LYMPH NODE BIOPSY;  Surgeon: Fredirick Maudlin, MD;  Location: [facility] ORS;  Service: General;  Laterality: Left;  . COLONOSCOPY  02/15/2009  . EXCISION OF BREAST BIOPSY Left 07/12/2020   tag placement 07/10/20 vison tag [id number]; hourglass tag [id number]; cylinder tag [id number]  . TUBAL LIGATION      SOCIAL HISTORY: Social History   Socioeconomic History  . Marital status: Married    Spouse name: Darrell  . Number of children: Not on file  . Years of education: Not on file  . Highest education level: Not on file  Occupational History  . Not on file  Tobacco Use  . Smoking status: Former Smoker    Years: 20.00    Types: Cigarettes    Quit date: 2020    Years since quitting: 2.3  . Smokeless tobacco: Never Used  Vaping Use  . Vaping Use: Never used  Substance and Sexual Activity  . Alcohol use: Never  . Drug use: Never  . Sexual activity: Not on file  Other Topics Concern  . Not on file  Social History Narrative  . Not on file   Social Determinants of Health   Financial Resource Strain: Not on file  Food Insecurity: Not on file  Transportation Needs: Not on file  Physical Activity: Not on file  Stress: Not on file  Social Connections: Not on file  Intimate Partner Violence: Not on file    FAMILY HISTORY: Family History  Problem Relation Age of Onset  . Ovarian cancer Sister 76  . Breast cancer Maternal Grandmother        dx 37s  . Breast cancer Paternal Grandmother     ALLERGIES:  has No Known Allergies.  MEDICATIONS:  Current Outpatient Medications  Medication Sig Dispense Refill  . aspirin-acetaminophen-caffeine (EXCEDRIN MIGRAINE) 250-250-65 MG tablet Take 1 tablet by mouth daily as needed (headaches).    Marland Kitchen atorvastatin (LIPITOR) 10 MG tablet Take 10 mg by mouth daily.    . Cholecalciferol 25 MCG (1000 UT) tablet Take 1,000 Units by mouth daily.    Marland Kitchen ibuprofen (ADVIL) 800 MG tablet Take 1 tablet (800 mg total) by mouth every 8 (eight) hours as needed for  headache or moderate pain. 30 tablet 0  . magnesium oxide (MAG-OX) 400 MG tablet Take 400 mg by mouth daily.    . Omega-3 Fatty Acids (FISH OIL) 1000 MG CAPS Take 1,000 mg by mouth daily.    Marland Kitchen omeprazole (PRILOSEC) 20 MG capsule Take 20 mg by mouth daily.    . valsartan (DIOVAN) 80 MG tablet Take 80 mg by mouth daily.     No current facility-administered medications for this visit.      Marland Kitchen  PHYSICAL EXAMINATION: ECOG PERFORMANCE STATUS: 0 - Asymptomatic  Vitals:   08/14/20 1518  BP: 115/73  Pulse: 71  Resp: 20  Temp: (!) 96.6 F (35.9 C)   Filed Weights   08/14/20 1518  Weight: 157 lb (71.2 kg)    Physical Exam HENT:     Head: Normocephalic and atraumatic.     Mouth/Throat:     Pharynx: No oropharyngeal exudate.  Eyes:     Pupils: Pupils are equal, round, and reactive to light.  Cardiovascular:     Rate and Rhythm: Normal rate and regular rhythm.  Pulmonary:     Effort: No respiratory distress.     Breath sounds: No wheezing.  Abdominal:     General: Bowel sounds are normal. There is no distension.     Palpations: Abdomen is soft. There is no mass.     Tenderness: There is no abdominal tenderness. There is no guarding or rebound.  Musculoskeletal:        General: No tenderness. Normal range of motion.     Cervical back: Normal range of motion and neck supple.  Skin:    General: Skin is warm.  Neurological:     Mental Status: She is alert and oriented to person, place, and time.  Psychiatric:        Mood and Affect: Affect normal.      LABORATORY DATA:  I have reviewed the data as listed No results found for: WBC, HGB, HCT, MCV, PLT No results for input(s): NA, K, CL, CO2, GLUCOSE, BUN, CREATININE, CALCIUM, GFRNONAA, GFRAA, PROT, ALBUMIN, AST, ALT, ALKPHOS, BILITOT, BILIDIR, IBILI in the last 8760 hours.  RADIOGRAPHIC STUDIES: I have personally reviewed the  radiological images as listed and agreed with the findings in the report. No results  found.  ASSESSMENT & PLAN:   Carcinoma of upper-outer quadrant of left breast in female, estrogen receptor positive ([place]) #Stage I breast cancer- pT1a [5 mm; 4 mm] grade 3; pN0-postlumpectomy no residual malignancy noted.  ER positive PR negative; HER2 negative.  #Discussed the role of adjuvant radiation.  Also discussed the role of adjuvant antihormone therapy post radiation.  We will get Oncotype-given the grade 3 tumor; PR negative.  Discussed the possible role for chemotherapy if patient is high risk on Oncotype.  # Genetics:DECLINED  # DISPOSITION: # referral to Dr.Chrystal re: breast cancer # follow up TBD- Dr.B    All questions were answered. The patient/family knows to call the clinic with any problems, questions or concerns.       Cammie Sickle, MD 08/20/2020 10:32 PM

## 2020-08-14 NOTE — Progress Notes (Signed)
oncotype submitted per v/o Dr. Rogue Bussing

## 2020-08-21 ENCOUNTER — Encounter: Payer: Self-pay | Admitting: Radiation Oncology

## 2020-08-21 ENCOUNTER — Ambulatory Visit
Admission: RE | Admit: 2020-08-21 | Discharge: 2020-08-21 | Disposition: A | Discharge status: Home / Self Care | Payer: Medicare HMO | Source: Ambulatory Visit | Attending: Radiation Oncology | Admitting: Radiation Oncology

## 2020-08-21 VITALS — BP 155/88 | HR 59 | Temp 96.9°F | Wt 157.0 lb

## 2020-08-21 DIAGNOSIS — Z87891 Personal history of nicotine dependence: Secondary | ICD-10-CM | POA: Diagnosis not present

## 2020-08-21 DIAGNOSIS — Z803 Family history of malignant neoplasm of breast: Secondary | ICD-10-CM | POA: Diagnosis not present

## 2020-08-21 DIAGNOSIS — I10 Essential (primary) hypertension: Secondary | ICD-10-CM | POA: Diagnosis not present

## 2020-08-21 DIAGNOSIS — K219 Gastro-esophageal reflux disease without esophagitis: Secondary | ICD-10-CM | POA: Diagnosis not present

## 2020-08-21 DIAGNOSIS — Z8041 Family history of malignant neoplasm of ovary: Secondary | ICD-10-CM | POA: Diagnosis not present

## 2020-08-21 DIAGNOSIS — E119 Type 2 diabetes mellitus without complications: Secondary | ICD-10-CM | POA: Diagnosis not present

## 2020-08-21 DIAGNOSIS — K449 Diaphragmatic hernia without obstruction or gangrene: Secondary | ICD-10-CM | POA: Diagnosis not present

## 2020-08-21 DIAGNOSIS — Z170 Estrogen receptor positive status [ER+]: Secondary | ICD-10-CM | POA: Insufficient documentation

## 2020-08-21 DIAGNOSIS — Z79899 Other long term (current) drug therapy: Secondary | ICD-10-CM | POA: Insufficient documentation

## 2020-08-21 DIAGNOSIS — C50412 Malignant neoplasm of upper-outer quadrant of left female breast: Secondary | ICD-10-CM | POA: Insufficient documentation

## 2020-08-21 DIAGNOSIS — E785 Hyperlipidemia, unspecified: Secondary | ICD-10-CM | POA: Insufficient documentation

## 2020-08-21 NOTE — Consult Note (Signed)
NEW PATIENT EVALUATION  Name: Candice Bowers  MRN: [id number]  Date:   08/21/2020     DOB: 03/08/1956   This 65 y.o. female patient presents to the clinic for initial evaluation of stage I ER positive invasive mammary carcinoma the left breast status post wide local excision and sentinel node biopsy.  REFERRING PHYSICIAN: Sofie Hartigan, MD  CHIEF COMPLAINT:  Chief Complaint  Patient presents with  . Breast Cancer    Initial consultation    DIAGNOSIS: The encounter diagnosis was Carcinoma of upper-outer quadrant of left breast in female, estrogen receptor positive ([place]).   PREVIOUS INVESTIGATIONS:  Mammogram ultrasound MRI scans reviewed Clinical notes reviewed Pathology report reviewed  HPI: Patient is a 65 year old female initially presented in January 2022 with 8.5 cm regular area with calcifications in the upper outer quadrant of the left breast suspicious for malignancy.  She also had a indeterminate 0.4 cm group of upper outer left breast calcifications located 2.5 cm from regular mass of the as stated above.  Biopsy was positive for grade 3 invasive mammary carcinoma she also had high-grade ductal carcinoma in situ with calcifications.  Patient did have back in March and MRI of her bilateral breast showing no evidence of malignancy in the right breast.  There was non-mass enhancement extending anterior superior to recent biopsy site in the left breast.  No adenopathy noted.  She underwent wide local excision.  She had multiple RF ID chips placed only 1 area showed ductal carcinoma in situ with margin close at 0.1 cm.  No invasive carcinoma was seen.  2 sentinel lymph nodes were negative for malignancy.  There was DCIS present on the ink with thermal artifact however it was not possible determine how 5 lumpectomy specimen is related to 1 another.  Tumor was ER positive strongly PR negative and HER2/neu not overexpressed.  Oncotype DX has been ordered.  She is seen today for  radiation oncology opinion.  She specifically denies breast tenderness cough or bone pain.  PLANNED TREATMENT REGIMEN: Left hypofractionated whole breast radiation plus scar boost  PAST MEDICAL HISTORY:  has a past medical history of Carcinoma of upper-outer quadrant of left breast in female, estrogen receptor positive ([place]) (06/06/2020), Diabetes mellitus without complication ([place]), Family history of breast cancer, Family history of ovarian cancer, GERD (gastroesophageal reflux disease), History of hiatal hernia, Hyperlipidemia, Hypertension, and Migraines.    PAST SURGICAL HISTORY:  Past Surgical History:  Procedure Laterality Date  . BREAST BIOPSY Left 05/29/2020   affirm bx "X" clp-invasive  . BREAST BIOPSY Left 05/29/2020   Affirm bx"Coil" clip-invasive  . BREAST BIOPSY Right 2001   excisional biopsy  . BREAST EXCISIONAL BIOPSY Right 06/19/1999   neg  . BREAST LUMPECTOMY    . BREAST LUMPECTOMY,RADIO FREQ LOCALIZER,AXILLARY SENTINEL LYMPH NODE BIOPSY Left 07/12/2020   Procedure: BREAST LUMPECTOMY,RADIO FREQ LOCALIZER,AXILLARY SENTINEL LYMPH NODE BIOPSY;  Surgeon: Fredirick Maudlin, MD;  Location: [facility] ORS;  Service: General;  Laterality: Left;  . COLONOSCOPY  02/15/2009  . EXCISION OF BREAST BIOPSY Left 07/12/2020   tag placement 07/10/20 vison tag [id number]; hourglass tag [id number]; cylinder tag [id number]  . TUBAL LIGATION      FAMILY HISTORY: family history includes Breast cancer in her maternal grandmother and paternal grandmother; Ovarian cancer (age of onset: 17) in her sister.  SOCIAL HISTORY:  reports that she quit smoking about 2 years ago. Her smoking use included cigarettes. She quit after 20.00 years of use. She has never used smokeless tobacco. She  reports that she does not drink alcohol and does not use drugs.  ALLERGIES: Patient has no known allergies.  MEDICATIONS:  Current Outpatient Medications  Medication Sig Dispense Refill  . aspirin-acetaminophen-caffeine (EXCEDRIN MIGRAINE)  250-250-65 MG tablet Take 1 tablet by mouth daily as needed (headaches).    Marland Kitchen atorvastatin (LIPITOR) 10 MG tablet Take 10 mg by mouth daily.    . Cholecalciferol 25 MCG (1000 UT) tablet Take 1,000 Units by mouth daily.    Marland Kitchen ibuprofen (ADVIL) 800 MG tablet Take 1 tablet (800 mg total) by mouth every 8 (eight) hours as needed for headache or moderate pain. 30 tablet 0  . magnesium oxide (MAG-OX) 400 MG tablet Take 400 mg by mouth daily.    . Omega-3 Fatty Acids (FISH OIL) 1000 MG CAPS Take 1,000 mg by mouth daily.    Marland Kitchen omeprazole (PRILOSEC) 20 MG capsule Take 20 mg by mouth daily.    . valsartan (DIOVAN) 80 MG tablet Take 80 mg by mouth daily.     No current facility-administered medications for this encounter.    ECOG PERFORMANCE STATUS:  0 - Asymptomatic  REVIEW OF SYSTEMS: Patient denies any weight loss, fatigue, weakness, fever, chills or night sweats. Patient denies any loss of vision, blurred vision. Patient denies any ringing  of the ears or hearing loss. No irregular heartbeat. Patient denies heart murmur or history of fainting. Patient denies any chest pain or pain radiating to her upper extremities. Patient denies any shortness of breath, difficulty breathing at night, cough or hemoptysis. Patient denies any swelling in the lower legs. Patient denies any nausea vomiting, vomiting of blood, or coffee ground material in the vomitus. Patient denies any stomach pain. Patient states has had normal bowel movements no significant constipation or diarrhea. Patient denies any dysuria, hematuria or significant nocturia. Patient denies any problems walking, swelling in the joints or loss of balance. Patient denies any skin changes, loss of hair or loss of weight. Patient denies any excessive worrying or anxiety or significant depression. Patient denies any problems with insomnia. Patient denies excessive thirst, polyuria, polydipsia. Patient denies any swollen glands, patient denies easy bruising or easy  bleeding. Patient denies any recent infections, allergies or URI. Patient "s visual fields have not changed significantly in recent time.   PHYSICAL EXAM: BP (!) 155/88   Pulse (!) 59   Temp (!) 96.9 F (36.1 C) (Tympanic)   Wt 157 lb (71.2 kg)   BMI 26.95 kg/m  Patient is status post wide local excision in approximately the 2 o'clock position of the left breast.  There is some retraction of the breast towards the scar based on the orientation of the incision.  No dominant masses noted in either breast in 2 positions examined.  No axillary or supraclavicular adenopathy is noted.  Well-developed well-nourished patient in NAD. HEENT reveals PERLA, EOMI, discs not visualized.  Oral cavity is clear. No oral mucosal lesions are identified. Neck is clear without evidence of cervical or supraclavicular adenopathy. Lungs are clear to A&P. Cardiac examination is essentially unremarkable with regular rate and rhythm without murmur rub or thrill. Abdomen is benign with no organomegaly or masses noted. Motor sensory and DTR levels are equal and symmetric in the upper and lower extremities. Cranial nerves II through XII are grossly intact. Proprioception is intact. No peripheral adenopathy or edema is identified. No motor or sensory levels are noted. Crude visual fields are within normal range.  LABORATORY DATA: Pathology report reviewed    RADIOLOGY  RESULTS: Mammogram ultrasound and MRI scans reviewed compatible with above-stated findings   IMPRESSION: Stage I invasive mammary carcinoma of the left breast (T1 a N0 M0) ER positive PR negative status post wide local excision and sentinel node biopsy in 65 year old female  PLAN: Although it is a little confusing the multiple lumpectomy biopsies of this woman's breast I believe we are dealing with a very early stage invasive mammary carcinoma with DCIS.  There is still question about her margin although this may be related to thermal artifact.  I do not not  think further reexcision is necessary.  I would plan on a hypofractionated course of radiation therapy to her left breast over 3 weeks it also boost her scar another 1600 cGy using electron beam.  Risks and benefits of treatment including skin reaction fatigue alteration of blood counts possible inclusion of superficial lung all were reviewed in detail with the patient.  We will wait for her Oncotype DX results before proceeding to simulation.  I have tentatively given her a simulation appointment in about 2 weeks.  Patient comprehends my recommendations well.  She also will benefit from antiestrogen therapy after completion of radiation.  I would like to take this opportunity to thank you for allowing me to participate in the care of your patient.Noreene Filbert, MD

## 2020-08-28 ENCOUNTER — Encounter: Payer: Self-pay | Admitting: Internal Medicine

## 2020-09-02 ENCOUNTER — Telehealth: Payer: Self-pay | Admitting: Internal Medicine

## 2020-09-02 NOTE — Telephone Encounter (Signed)
Called pt on 5/23-; goes to VM; pt has appt with me on 5/24. GB

## 2020-09-03 ENCOUNTER — Inpatient Hospital Stay: Payer: Medicare HMO | Admitting: Internal Medicine

## 2020-09-03 ENCOUNTER — Encounter: Payer: Self-pay | Admitting: Internal Medicine

## 2020-09-03 DIAGNOSIS — Z170 Estrogen receptor positive status [ER+]: Secondary | ICD-10-CM | POA: Diagnosis not present

## 2020-09-03 DIAGNOSIS — C50412 Malignant neoplasm of upper-outer quadrant of left female breast: Secondary | ICD-10-CM | POA: Diagnosis not present

## 2020-09-03 MED ORDER — PROCHLORPERAZINE MALEATE 10 MG PO TABS: 10.0000 mg | ORAL_TABLET | Freq: Four times a day (QID) | ORAL | 1 refills | Status: DC | PRN

## 2020-09-03 MED ORDER — ONDANSETRON HCL 8 MG PO TABS: ORAL_TABLET | ORAL | 1 refills | Status: DC

## 2020-09-03 MED ORDER — DEXAMETHASONE 4 MG PO TABS: ORAL_TABLET | ORAL | 0 refills | Status: DC

## 2020-09-03 NOTE — Progress Notes (Signed)
START ON PATHWAY REGIMEN - Breast     A cycle is every 21 days:     Docetaxel      Cyclophosphamide   **Always confirm dose/schedule in your pharmacy ordering system**  Patient Characteristics: Postoperative without Neoadjuvant Therapy (Pathologic Staging), Invasive Disease, Adjuvant Therapy, HER2 Negative/Unknown/Equivocal, ER Positive, Node Negative, pT1a-c, pN0/N78m or pT2 or Higher, pN0, Oncotype High Risk (? 26) Therapeutic Status: Postoperative without Neoadjuvant Therapy (Pathologic Staging) AJCC Grade: G3 AJCC N Category: pN0 AJCC M Category: cM0 ER Status: Positive (+) AJCC 8 Stage Grouping: IA HER2 Status: Negative (-) Oncotype Dx Recurrence Score: 35 AJCC T Category: pT1a PR Status: Negative (-) Adjuvant Therapy Status: No Adjuvant Therapy Received Yet or Changing Initial Adjuvant Regimen due to Tolerance Has this patient completed genomic testing<= Yes - Oncotype DX(R) Intent of Therapy: Curative Intent, Discussed with Patient

## 2020-09-03 NOTE — Progress Notes (Signed)
one [place] NOTE  Patient Care Team: Sofie Hartigan, MD as PCP - General (Family Medicine) Theodore Demark, RN as Oncology Nurse Navigator  CHIEF COMPLAINTS/PURPOSE OF CONSULTATION: Breast cancer  #  Oncology History Overview Note  # JAN 2022-  0.5 cm irregular UPPER-OUTER LEFT breast mass with calcifications extending 1.5 cm anteroinferiorly, suspicious for malignancy. Ultrasound-guided biopsy of the mass is recommended. Stereotactic guided biopsy of the anterior/inferior most calcifications extending from this mass is also recommended. 2. Indeterminate 0.4 cm group of UPPER-OUTER LEFT breast calcifications, located 2.5 cm from the irregular mass above. 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 This was found to be concordant by Dr. Zerita Boers.   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 This was found to be concordant by Dr. Zerita Boers.   Pathology revealed COLUMNAR CELL CHANGE WITH ASSOCIATED CALCIFICATIONS, NEGATIVE FOR ATYPIA AND MALIGNANCY of the LEFT breast calcifications, medial upper outer quadrant, coil clip. This was found to be concordant by Dr. Zerita Boers.   Of note, the area of invasive mammary carcinoma spans at least 1.9 cm mammographically.  # BREAST, LEFT 2:00 7 CM FN; ULTRASOUND-GUIDED BIOPSY (VISION CLIP):  - INVASIVE MAMMARY CARCINOMA, NO SPECIAL TYPE.  Size of invasive carcinoma: 5 mm in this sample  Histologic grade of invasive carcinoma: Grade 3                       Glandular/tubular  differentiation score: 3                       Nuclear pleomorphism score: 3                       Mitotic rate score: 2                       Total score: 8  Ductal carcinoma in situ: Present, high-grade with calcification  Lymphovascular invasion: Not identified   B.  BREAST WITH CALCIFICATIONS, LEFT LATERAL UPPER OUTER QUADRANT;  STEREOTACTIC BIOPSY (X SHAPED CLIP):  - INVASIVE MAMMARY CARCINOMA, NO SPECIAL TYPE.  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  - CALCIFICATIONS ASSOCIATED WITH DCIS AND SCLEROSING ADENOSIS.  - AREAS SUSPICIOUS FOR LYMPHOVASCULAR INVASION.  Size of invasive carcinoma: 4 mm in this sample  Histologic grade of invasive carcinoma: Grade 3                       Glandular/tubular differentiation score: 3                       Nuclear pleomorphism score: 3                       Mitotic rate score: 2                       Total score: 8   C.  BREAST WITH CALCIFICATIONS, LEFT MEDIAL UPPER OUTER QUADRANT;  STEREOTACTIC BIOPSY (COIL-SHAPED CLIP):  - COLUMNAR CELL  CHANGE WITH ASSOCIATED CALCIFICATIONS.  - NEGATIVE FOR ATYPIA AND MALIGNANCY. BIOPSY- ER > 90%; PR-NEG; Her-2 NEG -----------------------------------------------------------------  TNM Descriptors: M (multifocal)  pT1a  Regional Lymph Nodes Modifier: SN  pN0  pM - Not applicable   #  HIGH RISK ONCOTYPE- RS score =35; which unfortunately translates to up to 23% risk of distant recurrence at 9 years.  Benefit from chemotherapy is more than 15%.  # JUne 2nd 2022- START TAX-CYTOXAN q3w x4.   Carcinoma of upper-outer quadrant of left breast in female, estrogen receptor positive ([place])  06/06/2020 Initial Diagnosis   Carcinoma of upper-outer quadrant of left breast in female, estrogen receptor positive ([place])   06/06/2020 Cancer Staging   Staging form: Breast, AJCC 8th Edition - Clinical: Stage IB (cT1b, cN0, cM0, G3, ER+, PR-, HER2-) - Signed by Cammie Sickle, MD  on 06/06/2020 Stage prefix: Initial diagnosis   09/16/2020 -  Chemotherapy    Patient is on Treatment Plan: BREAST TC Q21D         HISTORY OF PRESENTING ILLNESS:  [place] 65 y.o.  female newly diagnosed stage I breast cancer ER/PR positive HER2 negative status postlumpectomy is here to review the results of her Oncotype.  In the interim patient was evaluated by radiation oncology; awaiting simulation done tomorrow.   Otherwise denies any postoperative complications.  Denies any fevers or chills.  No nausea no vomiting.  Review of Systems  Constitutional: Negative for chills, diaphoresis, fever, malaise/fatigue and weight loss.  HENT: Negative for nosebleeds and sore throat.   Eyes: Negative for double vision.  Respiratory: Negative for cough, hemoptysis, sputum production, shortness of breath and wheezing.   Cardiovascular: Negative for chest pain, palpitations, orthopnea and leg swelling.  Gastrointestinal: Negative for abdominal pain, blood in stool, constipation, diarrhea, heartburn, melena, nausea and vomiting.  Genitourinary: Negative for dysuria, frequency and urgency.  Musculoskeletal: Negative for back pain and joint pain.  Skin: Negative.  Negative for itching and rash.  Neurological: Negative for dizziness, tingling, focal weakness, weakness and headaches.  Endo/Heme/Allergies: Does not bruise/bleed easily.  Psychiatric/Behavioral: Negative for depression. The patient is not nervous/anxious and does not have insomnia.      MEDICAL HISTORY:  Past Medical History:  Diagnosis Date  . Carcinoma of upper-outer quadrant of left breast in female, estrogen receptor positive ([place]) 06/06/2020  . Diabetes mellitus without complication (HCC)    diet controlled  . Family history of breast cancer   . Family history of ovarian cancer   . GERD (gastroesophageal reflux disease)   . History of hiatal hernia   . Hyperlipidemia   . Hypertension   . Migraines     SURGICAL  HISTORY: Past Surgical History:  Procedure Laterality Date  . BREAST BIOPSY Left 05/29/2020   affirm bx "X" clp-invasive  . BREAST BIOPSY Left 05/29/2020   Affirm bx"Coil" clip-invasive  . BREAST BIOPSY Right 2001   excisional biopsy  . BREAST EXCISIONAL BIOPSY Right 06/19/1999   neg  . BREAST LUMPECTOMY    . BREAST LUMPECTOMY,RADIO FREQ LOCALIZER,AXILLARY SENTINEL LYMPH NODE BIOPSY Left 07/12/2020   Procedure: BREAST LUMPECTOMY,RADIO FREQ LOCALIZER,AXILLARY SENTINEL LYMPH NODE BIOPSY;  Surgeon: Fredirick Maudlin, MD;  Location: [facility] ORS;  Service: General;  Laterality: Left;  . COLONOSCOPY  02/15/2009  . EXCISION OF BREAST BIOPSY Left 07/12/2020   tag placement 07/10/20 vison tag [id number]; hourglass tag [id number]; cylinder tag [id number]  . TUBAL LIGATION      SOCIAL HISTORY: Social History   Socioeconomic History  .  Marital status: Married    Spouse name: Darrell  . Number of children: Not on file  . Years of education: Not on file  . Highest education level: Not on file  Occupational History  . Not on file  Tobacco Use  . Smoking status: Former Smoker    Years: 20.00    Types: Cigarettes    Quit date: 2020    Years since quitting: 2.3  . Smokeless tobacco: Never Used  Vaping Use  . Vaping Use: Never used  Substance and Sexual Activity  . Alcohol use: Never  . Drug use: Never  . Sexual activity: Not on file  Other Topics Concern  . Not on file  Social History Narrative  . Not on file   Social Determinants of Health   Financial Resource Strain: Not on file  Food Insecurity: Not on file  Transportation Needs: Not on file  Physical Activity: Not on file  Stress: Not on file  Social Connections: Not on file  Intimate Partner Violence: Not on file    FAMILY HISTORY: Family History  Problem Relation Age of Onset  . Ovarian cancer Sister 26  . Breast cancer Maternal Grandmother        dx 80s  . Breast cancer Paternal Grandmother     ALLERGIES:  has No Known  Allergies.  MEDICATIONS:  Current Outpatient Medications  Medication Sig Dispense Refill  . aspirin-acetaminophen-caffeine (EXCEDRIN MIGRAINE) 250-250-65 MG tablet Take 1 tablet by mouth daily as needed (headaches).    Marland Kitchen atorvastatin (LIPITOR) 10 MG tablet Take 10 mg by mouth daily.    . Cholecalciferol 25 MCG (1000 UT) tablet Take 1,000 Units by mouth daily.    Marland Kitchen dexamethasone (DECADRON) 4 MG tablet Take one pill AM & PM for 2 days. TAKE day prior & day AFTER chemo. Do NOT take on day of chemo. 20 tablet 0  . ibuprofen (ADVIL) 800 MG tablet Take 1 tablet (800 mg total) by mouth every 8 (eight) hours as needed for headache or moderate pain. 30 tablet 0  . magnesium oxide (MAG-OX) 400 MG tablet Take 400 mg by mouth daily.    . Omega-3 Fatty Acids (FISH OIL) 1000 MG CAPS Take 1,000 mg by mouth daily.    Marland Kitchen omeprazole (PRILOSEC) 20 MG capsule Take 20 mg by mouth daily.    . ondansetron (ZOFRAN) 8 MG tablet One pill every 8 hours as needed for nausea/vomitting. 40 tablet 1  . prochlorperazine (COMPAZINE) 10 MG tablet Take 1 tablet (10 mg total) by mouth every 6 (six) hours as needed for nausea or vomiting. 40 tablet 1  . valsartan (DIOVAN) 80 MG tablet Take 80 mg by mouth daily.     No current facility-administered medications for this visit.      Marland Kitchen  PHYSICAL EXAMINATION: ECOG PERFORMANCE STATUS: 0 - Asymptomatic  Vitals:   09/03/20 0848  BP: 123/84  Pulse: 66  Resp: 16  Temp: 98.4 F (36.9 C)  SpO2: 100%   Filed Weights   09/03/20 0848  Weight: 156 lb (70.8 kg)    Physical Exam HENT:     Head: Normocephalic and atraumatic.     Mouth/Throat:     Pharynx: No oropharyngeal exudate.  Eyes:     Pupils: Pupils are equal, round, and reactive to light.  Cardiovascular:     Rate and Rhythm: Normal rate and regular rhythm.  Pulmonary:     Effort: No respiratory distress.     Breath sounds: No wheezing.  Abdominal:     General: Bowel sounds are normal. There is no distension.      Palpations: Abdomen is soft. There is no mass.     Tenderness: There is no abdominal tenderness. There is no guarding or rebound.  Musculoskeletal:        General: No tenderness. Normal range of motion.     Cervical back: Normal range of motion and neck supple.  Skin:    General: Skin is warm.  Neurological:     Mental Status: She is alert and oriented to person, place, and time.  Psychiatric:        Mood and Affect: Affect normal.      LABORATORY DATA:  I have reviewed the data as listed No results found for: WBC, HGB, HCT, MCV, PLT No results for input(s): NA, K, CL, CO2, GLUCOSE, BUN, CREATININE, CALCIUM, GFRNONAA, GFRAA, PROT, ALBUMIN, AST, ALT, ALKPHOS, BILITOT, BILIDIR, IBILI in the last 8760 hours.  RADIOGRAPHIC STUDIES: I have personally reviewed the radiological images as listed and agreed with the findings in the report. No results found.  ASSESSMENT & PLAN:   Carcinoma of upper-outer quadrant of left breast in female, estrogen receptor positive ([place]) #Stage I breast cancer- pT1a [5 mm; 4 mm] grade 3; pN0-postlumpectomy no residual malignancy noted.  ER positive PR negative; HER2 negative. HIGH RISK ONCOTYPE- RS score =35; which unfortunately translates to up to 23% risk of distant recurrence at 9 years.  Benefit from chemotherapy is more than 15%.  #Reviewed the above data with the patient in detail.  Interested in chemotherapy.  Discussed regarding use of Taxotere-Cytoxan.  Discussed the role of radiation post chemotherapy.  Patient also benefit from adjuvant endocrine therapy after radiation.   Discussed the potential side effects including but not limited to-increasing fatigue, nausea vomiting, diarrhea, hair loss, sores in the mouth, increase risk of infection and also neuropathy.  Discussed use of steroids; following prescription for antiemetics.  Discussed regarding port; hold off for now.  Chemotherapy education.  # Growth factor-udenyca would be given as  prophylaxis for chemotherapy-induced neutropenia to prevent febrile neutropenias. Discussed potential side effect- myalgias/arthralgias. Discuss re: claritin.   # DISPOSITION: # chemo education # follow up on June 6th- MD; labs- cbc/cmp; Cytoxan-Taxoeter [New];D-2 Ellen Henri Dr.B  # 40 minutes face-to-face with the patient discussing the above plan of care; more than 50% of time spent on prognosis/ natural history; counseling and coordination.   All questions were answered. The patient/family knows to call the clinic with any problems, questions or concerns.   Cammie Sickle, MD 09/03/2020 9:59 AM

## 2020-09-03 NOTE — Assessment & Plan Note (Addendum)
#  Stage I breast cancer- pT1a [5 mm; 4 mm] grade 3; pN0-postlumpectomy no residual malignancy noted.  ER positive PR negative; HER2 negative. HIGH RISK ONCOTYPE- RS score =35; which unfortunately translates to up to 23% risk of distant recurrence at 9 years.  Benefit from chemotherapy is more than 15%.  #Reviewed the above data with the patient in detail.  Interested in chemotherapy.  Discussed regarding use of Taxotere-Cytoxan.  Discussed the role of radiation post chemotherapy.  Patient also benefit from adjuvant endocrine therapy after radiation.   Discussed the potential side effects including but not limited to-increasing fatigue, nausea vomiting, diarrhea, hair loss, sores in the mouth, increase risk of infection and also neuropathy.  Discussed use of steroids; following prescription for antiemetics.  Discussed regarding port; hold off for now.  Chemotherapy education.  # Growth factor-udenyca would be given as prophylaxis for chemotherapy-induced neutropenia to prevent febrile neutropenias. Discussed potential side effect- myalgias/arthralgias. Discuss re: claritin.   # DISPOSITION: # chemo education # follow up on June 6th- MD; labs- cbc/cmp; Cytoxan-Taxoeter [New];D-2 Ellen Henri Dr.B  # 40 minutes face-to-face with the patient discussing the above plan of care; more than 50% of time spent on prognosis/ natural history; counseling and coordination.

## 2020-09-04 ENCOUNTER — Ambulatory Visit: Payer: Medicare HMO

## 2020-09-11 NOTE — Patient Instructions (Signed)
Docetaxel injection What is this medicine? DOCETAXEL (doe se TAX el) is a chemotherapy drug. It targets fast dividing cells, like cancer cells, and causes these cells to die. This medicine is used to treat many types of cancers like breast cancer, certain stomach cancers, head and neck cancer, lung cancer, and prostate cancer. This medicine may be used for other purposes; ask your health care provider or pharmacist if you have questions. COMMON BRAND NAME(S): Docefrez, Taxotere What should I tell my health care provider before I take this medicine? They need to know if you have any of these conditions:  infection (especially a virus infection such as chickenpox, cold sores, or herpes)  liver disease  low blood counts, like low white cell, platelet, or red cell counts  an unusual or allergic reaction to docetaxel, polysorbate 80, other chemotherapy agents, other medicines, foods, dyes, or preservatives  pregnant or trying to get pregnant  breast-feeding How should I use this medicine? This drug is given as an infusion into a vein. It is administered in a hospital or clinic by a specially trained health care professional. Talk to your pediatrician regarding the use of this medicine in children. Special care may be needed. Overdosage: If you think you have taken too much of this medicine contact a poison control center or emergency room at once. NOTE: This medicine is only for you. Do not share this medicine with others. What if I miss a dose? It is important not to miss your dose. Call your doctor or health care professional if you are unable to keep an appointment. What may interact with this medicine? Do not take this medicine with any of the following medications:  live virus vaccines This medicine may also interact with the following medications:  aprepitant  certain antibiotics like erythromycin or clarithromycin  certain antivirals for HIV or hepatitis  certain medicines for  fungal infections like fluconazole, itraconazole, ketoconazole, posaconazole, or voriconazole  cimetidine  ciprofloxacin  conivaptan  cyclosporine  dronedarone  fluvoxamine  grapefruit juice  imatinib  verapamil This list may not describe all possible interactions. Give your health care provider a list of all the medicines, herbs, non-prescription drugs, or dietary supplements you use. Also tell them if you smoke, drink alcohol, or use illegal drugs. Some items may interact with your medicine. What should I watch for while using this medicine? Your condition will be monitored carefully while you are receiving this medicine. You will need important blood work done while you are taking this medicine. Call your doctor or health care professional for advice if you get a fever, chills or sore throat, or other symptoms of a cold or flu. Do not treat yourself. This drug decreases your body's ability to fight infections. Try to avoid being around people who are sick. Some products may contain alcohol. Ask your health care professional if this medicine contains alcohol. Be sure to tell all health care professionals you are taking this medicine. Certain medicines, like metronidazole and disulfiram, can cause an unpleasant reaction when taken with alcohol. The reaction includes flushing, headache, nausea, vomiting, sweating, and increased thirst. The reaction can last from 30 minutes to several hours. You may get drowsy or dizzy. Do not drive, use machinery, or do anything that needs mental alertness until you know how this medicine affects you. Do not stand or sit up quickly, especially if you are an older patient. This reduces the risk of dizzy or fainting spells. Alcohol may interfere with the effect of this  medicine. Talk to your health care professional about your risk of cancer. You may be more at risk for certain types of cancer if you take this medicine. Do not become pregnant while taking  this medicine or for 6 months after stopping it. Women should inform their doctor if they wish to become pregnant or think they might be pregnant. There is a potential for serious side effects to an unborn child. Talk to your health care professional or pharmacist for more information. Do not breast-feed an infant while taking this medicine or for 1 week after stopping it. Males who get this medicine must use a condom during sex with females who can get pregnant. If you get a woman pregnant, the baby could have birth defects. The baby could die before they are born. You will need to continue wearing a condom for 3 months after stopping the medicine. Tell your health care provider right away if your partner becomes pregnant while you are taking this medicine. This may interfere with the ability to father a child. You should talk to your doctor or health care professional if you are concerned about your fertility. What side effects may I notice from receiving this medicine? Side effects that you should report to your doctor or health care professional as soon as possible:  allergic reactions like skin rash, itching or hives, swelling of the face, lips, or tongue  blurred vision  breathing problems  changes in vision  low blood counts - This drug may decrease the number of white blood cells, red blood cells and platelets. You may be at increased risk for infections and bleeding.  nausea and vomiting  pain, redness or irritation at site where injected  pain, tingling, numbness in the hands or feet  redness, blistering, peeling, or loosening of the skin, including inside the mouth  signs of decreased platelets or bleeding - bruising, pinpoint red spots on the skin, black, tarry stools, nosebleeds  signs of decreased red blood cells - unusually weak or tired, fainting spells, lightheadedness  signs of infection - fever or chills, cough, sore throat, pain or difficulty passing urine  swelling  of the ankle, feet, hands Side effects that usually do not require medical attention (report to your doctor or health care professional if they continue or are bothersome):  constipation  diarrhea  fingernail or toenail changes  hair loss  loss of appetite  mouth sores  muscle pain This list may not describe all possible side effects. Call your doctor for medical advice about side effects. You may report side effects to FDA at 1-800-FDA-1088. Where should I keep my medicine? This drug is given in a hospital or clinic and will not be stored at home. NOTE: This sheet is a summary. It may not cover all possible information. If you have questions about this medicine, talk to your doctor, pharmacist, or health care provider.  2021 [employer] Standard (2019-02-27 19:50:31) Cyclophosphamide Injection What is this medicine? CYCLOPHOSPHAMIDE (sye kloe FOSS fa mide) is a chemotherapy drug. It slows the growth of cancer cells. This medicine is used to treat many types of cancer like lymphoma, myeloma, leukemia, breast cancer, and ovarian cancer, to name a few. This medicine may be used for other purposes; ask your health care provider or pharmacist if you have questions. COMMON BRAND NAME(S): Cytoxan, Neosar What should I tell my health care provider before I take this medicine? They need to know if you have any of these conditions:  heart disease  history of irregular heartbeat  infection  kidney disease  liver disease  low blood counts, like white cells, platelets, or red blood cells  on hemodialysis  recent or ongoing radiation therapy  scarring or thickening of the lungs  trouble passing urine  an unusual or allergic reaction to cyclophosphamide, other medicines, foods, dyes, or preservatives  pregnant or trying to get pregnant  breast-feeding How should I use this medicine? This drug is usually given as an injection into a vein or muscle or by infusion into a  vein. It is administered in a hospital or clinic by a specially trained health care professional. Talk to your pediatrician regarding the use of this medicine in children. Special care may be needed. Overdosage: If you think you have taken too much of this medicine contact a poison control center or emergency room at once. NOTE: This medicine is only for you. Do not share this medicine with others. What if I miss a dose? It is important not to miss your dose. Call your doctor or health care professional if you are unable to keep an appointment. What may interact with this medicine?  amphotericin B  azathioprine  certain antivirals for HIV or hepatitis  certain medicines for blood pressure, heart disease, irregular heart beat  certain medicines that treat or prevent blood clots like warfarin  certain other medicines for cancer  cyclosporine  etanercept  indomethacin  medicines that relax muscles for surgery  medicines to increase blood counts  metronidazole This list may not describe all possible interactions. Give your health care provider a list of all the medicines, herbs, non-prescription drugs, or dietary supplements you use. Also tell them if you smoke, drink alcohol, or use illegal drugs. Some items may interact with your medicine. What should I watch for while using this medicine? Your condition will be monitored carefully while you are receiving this medicine. You may need blood work done while you are taking this medicine. Drink water or other fluids as directed. Urinate often, even at night. Some products may contain alcohol. Ask your health care professional if this medicine contains alcohol. Be sure to tell all health care professionals you are taking this medicine. Certain medicines, like metronidazole and disulfiram, can cause an unpleasant reaction when taken with alcohol. The reaction includes flushing, headache, nausea, vomiting, sweating, and increased thirst.  The reaction can last from 30 minutes to several hours. Do not become pregnant while taking this medicine or for 1 year after stopping it. Women should inform their health care professional if they wish to become pregnant or think they might be pregnant. Men should not father a child while taking this medicine and for 4 months after stopping it. There is potential for serious side effects to an unborn child. Talk to your health care professional for more information. Do not breast-feed an infant while taking this medicine or for 1 week after stopping it. This medicine has caused ovarian failure in some women. This medicine may make it more difficult to get pregnant. Talk to your health care professional if you are concerned about your fertility. This medicine has caused decreased sperm counts in some men. This may make it more difficult to father a child. Talk to your health care professional if you are concerned about your fertility. Call your health care professional for advice if you get a fever, chills, or sore throat, or other symptoms of a cold or flu. Do not treat yourself. This medicine decreases your body's ability to  fight infections. Try to avoid being around people who are sick. Avoid taking medicines that contain aspirin, acetaminophen, ibuprofen, naproxen, or ketoprofen unless instructed by your health care professional. These medicines may hide a fever. Talk to your health care professional about your risk of cancer. You may be more at risk for certain types of cancer if you take this medicine. If you are going to need surgery or other procedure, tell your health care professional that you are using this medicine. Be careful brushing or flossing your teeth or using a toothpick because you may get an infection or bleed more easily. If you have any dental work done, tell your dentist you are receiving this medicine. What side effects may I notice from receiving this medicine? Side effects  that you should report to your doctor or health care professional as soon as possible:  allergic reactions like skin rash, itching or hives, swelling of the face, lips, or tongue  breathing problems  nausea, vomiting  signs and symptoms of bleeding such as bloody or black, tarry stools; red or dark brown urine; spitting up blood or brown material that looks like coffee grounds; red spots on the skin; unusual bruising or bleeding from the eyes, gums, or nose  signs and symptoms of heart failure like fast, irregular heartbeat, sudden weight gain; swelling of the ankles, feet, hands  signs and symptoms of infection like fever; chills; cough; sore throat; pain or trouble passing urine  signs and symptoms of kidney injury like trouble passing urine or change in the amount of urine  signs and symptoms of liver injury like dark yellow or brown urine; general ill feeling or flu-like symptoms; light-colored stools; loss of appetite; nausea; right upper belly pain; unusually weak or tired; yellowing of the eyes or skin Side effects that usually do not require medical attention (report to your doctor or health care professional if they continue or are bothersome):  confusion  decreased hearing  diarrhea  facial flushing  hair loss  headache  loss of appetite  missed menstrual periods  signs and symptoms of low red blood cells or anemia such as unusually weak or tired; feeling faint or lightheaded; falls  skin discoloration This list may not describe all possible side effects. Call your doctor for medical advice about side effects. You may report side effects to FDA at 1-800-FDA-1088. Where should I keep my medicine? This drug is given in a hospital or clinic and will not be stored at home. NOTE: This sheet is a summary. It may not cover all possible information. If you have questions about this medicine, talk to your doctor, pharmacist, or health care provider.  2021 [employer]/Gold  Standard (2019-01-02 09:53:29)

## 2020-09-12 ENCOUNTER — Inpatient Hospital Stay: Payer: Medicare HMO | Attending: Internal Medicine

## 2020-09-12 DIAGNOSIS — C50412 Malignant neoplasm of upper-outer quadrant of left female breast: Secondary | ICD-10-CM | POA: Insufficient documentation

## 2020-09-12 DIAGNOSIS — Z803 Family history of malignant neoplasm of breast: Secondary | ICD-10-CM | POA: Insufficient documentation

## 2020-09-12 DIAGNOSIS — M791 Myalgia, unspecified site: Secondary | ICD-10-CM | POA: Insufficient documentation

## 2020-09-12 DIAGNOSIS — R197 Diarrhea, unspecified: Secondary | ICD-10-CM | POA: Insufficient documentation

## 2020-09-12 DIAGNOSIS — M2550 Pain in unspecified joint: Secondary | ICD-10-CM | POA: Insufficient documentation

## 2020-09-12 DIAGNOSIS — Z79899 Other long term (current) drug therapy: Secondary | ICD-10-CM | POA: Insufficient documentation

## 2020-09-12 DIAGNOSIS — Z8041 Family history of malignant neoplasm of ovary: Secondary | ICD-10-CM | POA: Insufficient documentation

## 2020-09-12 DIAGNOSIS — Z5111 Encounter for antineoplastic chemotherapy: Secondary | ICD-10-CM | POA: Insufficient documentation

## 2020-09-12 DIAGNOSIS — Z170 Estrogen receptor positive status [ER+]: Secondary | ICD-10-CM | POA: Insufficient documentation

## 2020-09-12 DIAGNOSIS — Z87891 Personal history of nicotine dependence: Secondary | ICD-10-CM | POA: Insufficient documentation

## 2020-09-12 DIAGNOSIS — Z5189 Encounter for other specified aftercare: Secondary | ICD-10-CM | POA: Insufficient documentation

## 2020-09-16 ENCOUNTER — Inpatient Hospital Stay: Payer: Medicare HMO | Admitting: Internal Medicine

## 2020-09-16 ENCOUNTER — Inpatient Hospital Stay: Payer: Medicare HMO

## 2020-09-16 ENCOUNTER — Encounter: Payer: Self-pay | Admitting: Internal Medicine

## 2020-09-16 VITALS — BP 154/91 | HR 68 | Resp 20

## 2020-09-16 DIAGNOSIS — Z87891 Personal history of nicotine dependence: Secondary | ICD-10-CM | POA: Diagnosis not present

## 2020-09-16 DIAGNOSIS — C50412 Malignant neoplasm of upper-outer quadrant of left female breast: Secondary | ICD-10-CM

## 2020-09-16 DIAGNOSIS — Z803 Family history of malignant neoplasm of breast: Secondary | ICD-10-CM | POA: Diagnosis not present

## 2020-09-16 DIAGNOSIS — Z170 Estrogen receptor positive status [ER+]: Secondary | ICD-10-CM

## 2020-09-16 DIAGNOSIS — Z8041 Family history of malignant neoplasm of ovary: Secondary | ICD-10-CM | POA: Diagnosis not present

## 2020-09-16 DIAGNOSIS — M791 Myalgia, unspecified site: Secondary | ICD-10-CM | POA: Diagnosis not present

## 2020-09-16 DIAGNOSIS — Z79899 Other long term (current) drug therapy: Secondary | ICD-10-CM | POA: Diagnosis not present

## 2020-09-16 DIAGNOSIS — Z5189 Encounter for other specified aftercare: Secondary | ICD-10-CM | POA: Diagnosis not present

## 2020-09-16 DIAGNOSIS — R197 Diarrhea, unspecified: Secondary | ICD-10-CM | POA: Diagnosis not present

## 2020-09-16 DIAGNOSIS — Z5111 Encounter for antineoplastic chemotherapy: Secondary | ICD-10-CM | POA: Diagnosis present

## 2020-09-16 DIAGNOSIS — M2550 Pain in unspecified joint: Secondary | ICD-10-CM | POA: Diagnosis not present

## 2020-09-16 LAB — CBC WITH DIFFERENTIAL/PLATELET
Abs Immature Granulocytes: 0.02 10*3/uL (ref 0.00–0.07)
Basophils Absolute: 0 10*3/uL (ref 0.0–0.1)
Basophils Relative: 0 %
Eosinophils Absolute: 0 10*3/uL (ref 0.0–0.5)
Eosinophils Relative: 0 %
HCT: 38.7 % (ref 36.0–46.0)
Hemoglobin: 13.1 g/dL (ref 12.0–15.0)
Immature Granulocytes: 0 %
Lymphocytes Relative: 10 %
Lymphs Abs: 1 10*3/uL (ref 0.7–4.0)
MCH: 30.5 pg (ref 26.0–34.0)
MCHC: 33.9 g/dL (ref 30.0–36.0)
MCV: 90 fL (ref 80.0–100.0)
Monocytes Absolute: 0.2 10*3/uL (ref 0.1–1.0)
Monocytes Relative: 2 %
Neutro Abs: 8.5 10*3/uL — ABNORMAL HIGH (ref 1.7–7.7)
Neutrophils Relative %: 88 %
Platelets: 244 10*3/uL (ref 150–400)
RBC: 4.3 MIL/uL (ref 3.87–5.11)
RDW: 13 % (ref 11.5–15.5)
WBC: 9.7 10*3/uL (ref 4.0–10.5)
nRBC: 0 % (ref 0.0–0.2)

## 2020-09-16 LAB — COMPREHENSIVE METABOLIC PANEL
ALT: 19 U/L (ref 0–44)
AST: 20 U/L (ref 15–41)
Albumin: 4.5 g/dL (ref 3.5–5.0)
Alkaline Phosphatase: 71 U/L (ref 38–126)
Anion gap: 10 (ref 5–15)
BUN: 23 mg/dL (ref 8–23)
CO2: 24 mmol/L (ref 22–32)
Calcium: 9.6 mg/dL (ref 8.9–10.3)
Chloride: 105 mmol/L (ref 98–111)
Creatinine, Ser: 0.82 mg/dL (ref 0.44–1.00)
GFR, Estimated: >60 mL/min (ref >60)
Glucose, Bld: 164 mg/dL — ABNORMAL HIGH (ref 70–99)
Potassium: 4.1 mmol/L (ref 3.5–5.1)
Sodium: 139 mmol/L (ref 135–145)
Total Bilirubin: 0.5 mg/dL (ref 0.3–1.2)
Total Protein: 7.4 g/dL (ref 6.5–8.1)

## 2020-09-16 MED ORDER — CYCLOPHOSPHAMIDE CHEMO INJECTION 1 GM
1000.0000 mg | Freq: Once | INTRAMUSCULAR | Status: AC
  Administered 2020-09-16: 1000 mg via INTRAVENOUS
  Filled 2020-09-16: qty 50

## 2020-09-16 MED ORDER — SODIUM CHLORIDE 0.9 % IV SOLN
75.0000 mg/m2 | Freq: Once | INTRAVENOUS | Status: AC
  Administered 2020-09-16: 130 mg via INTRAVENOUS
  Filled 2020-09-16: qty 13

## 2020-09-16 MED ORDER — SODIUM CHLORIDE 0.9 % IV SOLN
10.0000 mg | Freq: Once | INTRAVENOUS | Status: AC
  Administered 2020-09-16: 10 mg via INTRAVENOUS
  Filled 2020-09-16: qty 10

## 2020-09-16 MED ORDER — PALONOSETRON HCL INJECTION 0.25 MG/5ML
0.2500 mg | Freq: Once | INTRAVENOUS | Status: AC
  Administered 2020-09-16: 0.25 mg via INTRAVENOUS
  Filled 2020-09-16: qty 5

## 2020-09-16 MED ORDER — SODIUM CHLORIDE 0.9 % IV SOLN
Freq: Once | INTRAVENOUS | Status: AC
  Filled 2020-09-16: qty 250

## 2020-09-16 NOTE — Progress Notes (Signed)
Patient denies new problems/concerns today.   °

## 2020-09-16 NOTE — Assessment & Plan Note (Addendum)
#  Stage I breast cancer- pT1a [5 mm; 4 mm] grade 3; pN0-postlumpectomy no residual malignancy noted.  ER positive PR negative; HER2 negative. HIGH RISK ONCOTYPE- RS score =35; which unfortunately translates to up to 23% risk of distant recurrence at 9 years.  Benefit from chemotherapy is more than 15%.  Currently on adjuvant Taxotere Cytoxan  # Proceed with Taxotere-Cytoxan- cycle #2  Today.Labs today reviewed;  acceptable for treatment today.    # DISPOSITION: # chemo today # labs in 10 days- cbc/bmp # follow up in 3weeks- MD; labs- cbc/cmp; Cytoxan-Taxoeter;D-2 udenyca Dr.B

## 2020-09-16 NOTE — Patient Instructions (Signed)
[facility]  Discharge Instructions: Thank you for choosing [facility] to provide your oncology and hematology care.  If you have a lab appointment with the [facility], please go directly to the [facility] and check in at the registration area.  Wear comfortable clothing and clothing appropriate for easy access to any Portacath or PICC line.   We strive to give you quality time with your provider. You may need to reschedule your appointment if you arrive late (15 or more minutes).  Arriving late affects you and other patients whose appointments are after yours.  Also, if you miss three or more appointments without notifying the office, you may be dismissed from the clinic at the provider's discretion.      For prescription refill requests, have your pharmacy contact our office and allow 72 hours for refills to be completed.    Today you received the following chemotherapy and/or immunotherapy agents Taxotere, Cytoxan      To help prevent nausea and vomiting after your treatment, we encourage you to take your nausea medication as directed.  BELOW ARE SYMPTOMS THAT SHOULD BE REPORTED IMMEDIATELY: . *FEVER GREATER THAN 100.4 F (38 C) OR HIGHER . *CHILLS OR SWEATING . *NAUSEA AND VOMITING THAT IS NOT CONTROLLED WITH YOUR NAUSEA MEDICATION . *UNUSUAL SHORTNESS OF BREATH . *UNUSUAL BRUISING OR BLEEDING . *URINARY PROBLEMS (pain or burning when urinating, or frequent urination) . *BOWEL PROBLEMS (unusual diarrhea, constipation, pain near the anus) . TENDERNESS IN MOUTH AND THROAT WITH OR WITHOUT PRESENCE OF ULCERS (sore throat, sores in mouth, or a toothache) . UNUSUAL RASH, SWELLING OR PAIN  . UNUSUAL VAGINAL DISCHARGE OR ITCHING   Items with * indicate a potential emergency and should be followed up as soon as possible or go to the Emergency Department if any problems should occur.  Please show the CHEMOTHERAPY ALERT CARD or  IMMUNOTHERAPY ALERT CARD at check-in to the Emergency Department and triage nurse.  Should you have questions after your visit or need to cancel or reschedule your appointment, please contact [facility]  [phone] and follow the prompts.  Office hours are 8:00 a.m. to 4:30 p.m. Monday - Friday. Please note that voicemails left after 4:00 p.m. may not be returned until the following business day.  We are closed weekends and major holidays. You have access to a nurse at all times for urgent questions. Please call the main number to the clinic [phone] and follow the prompts.  For any non-urgent questions, you may also contact your provider using MyChart. We now offer e-Visits for anyone 1 and older to request care online for non-urgent symptoms. For details visit [email or web address].   Also download the MyChart app! Go to the app store, search "MyChart", open the app, select [facility], and log in with your MyChart username and password.  Due to Covid, a mask is required upon entering the hospital/clinic. If you do not have a mask, one will be given to you upon arrival. For doctor visits, patients may have 1 support person aged 35 or older with them. For treatment visits, patients cannot have anyone with them due to current Covid guidelines and our immunocompromised population.   Cyclophosphamide Injection What is this medicine? CYCLOPHOSPHAMIDE (sye kloe FOSS fa mide) is a chemotherapy drug. It slows the growth of cancer cells. This medicine is used to treat many types of cancer like lymphoma, myeloma, leukemia, breast cancer, and ovarian cancer, to name  a few. This medicine may be used for other purposes; ask your health care provider or pharmacist if you have questions. COMMON BRAND NAME(S): Cytoxan, Neosar What should I tell my health care provider before I take this medicine? They need to know if you have any of these conditions:  heart  disease  history of irregular heartbeat  infection  kidney disease  liver disease  low blood counts, like white cells, platelets, or red blood cells  on hemodialysis  recent or ongoing radiation therapy  scarring or thickening of the lungs  trouble passing urine  an unusual or allergic reaction to cyclophosphamide, other medicines, foods, dyes, or preservatives  pregnant or trying to get pregnant  breast-feeding How should I use this medicine? This drug is usually given as an injection into a vein or muscle or by infusion into a vein. It is administered in a hospital or clinic by a specially trained health care professional. Talk to your pediatrician regarding the use of this medicine in children. Special care may be needed. Overdosage: If you think you have taken too much of this medicine contact a poison control center or emergency room at once. NOTE: This medicine is only for you. Do not share this medicine with others. What if I miss a dose? It is important not to miss your dose. Call your doctor or health care professional if you are unable to keep an appointment. What may interact with this medicine?  amphotericin B  azathioprine  certain antivirals for HIV or hepatitis  certain medicines for blood pressure, heart disease, irregular heart beat  certain medicines that treat or prevent blood clots like warfarin  certain other medicines for cancer  cyclosporine  etanercept  indomethacin  medicines that relax muscles for surgery  medicines to increase blood counts  metronidazole This list may not describe all possible interactions. Give your health care provider a list of all the medicines, herbs, non-prescription drugs, or dietary supplements you use. Also tell them if you smoke, drink alcohol, or use illegal drugs. Some items may interact with your medicine. What should I watch for while using this medicine? Your condition will be monitored carefully  while you are receiving this medicine. You may need blood work done while you are taking this medicine. Drink water or other fluids as directed. Urinate often, even at night. Some products may contain alcohol. Ask your health care professional if this medicine contains alcohol. Be sure to tell all health care professionals you are taking this medicine. Certain medicines, like metronidazole and disulfiram, can cause an unpleasant reaction when taken with alcohol. The reaction includes flushing, headache, nausea, vomiting, sweating, and increased thirst. The reaction can last from 30 minutes to several hours. Do not become pregnant while taking this medicine or for 1 year after stopping it. Women should inform their health care professional if they wish to become pregnant or think they might be pregnant. Men should not father a child while taking this medicine and for 4 months after stopping it. There is potential for serious side effects to an unborn child. Talk to your health care professional for more information. Do not breast-feed an infant while taking this medicine or for 1 week after stopping it. This medicine has caused ovarian failure in some women. This medicine may make it more difficult to get pregnant. Talk to your health care professional if you are concerned about your fertility. This medicine has caused decreased sperm counts in some men. This may make it  more difficult to father a child. Talk to your health care professional if you are concerned about your fertility. Call your health care professional for advice if you get a fever, chills, or sore throat, or other symptoms of a cold or flu. Do not treat yourself. This medicine decreases your body's ability to fight infections. Try to avoid being around people who are sick. Avoid taking medicines that contain aspirin, acetaminophen, ibuprofen, naproxen, or ketoprofen unless instructed by your health care professional. These medicines may hide  a fever. Talk to your health care professional about your risk of cancer. You may be more at risk for certain types of cancer if you take this medicine. If you are going to need surgery or other procedure, tell your health care professional that you are using this medicine. Be careful brushing or flossing your teeth or using a toothpick because you may get an infection or bleed more easily. If you have any dental work done, tell your dentist you are receiving this medicine. What side effects may I notice from receiving this medicine? Side effects that you should report to your doctor or health care professional as soon as possible:  allergic reactions like skin rash, itching or hives, swelling of the face, lips, or tongue  breathing problems  nausea, vomiting  signs and symptoms of bleeding such as bloody or black, tarry stools; red or dark brown urine; spitting up blood or brown material that looks like coffee grounds; red spots on the skin; unusual bruising or bleeding from the eyes, gums, or nose  signs and symptoms of heart failure like fast, irregular heartbeat, sudden weight gain; swelling of the ankles, feet, hands  signs and symptoms of infection like fever; chills; cough; sore throat; pain or trouble passing urine  signs and symptoms of kidney injury like trouble passing urine or change in the amount of urine  signs and symptoms of liver injury like dark yellow or brown urine; general ill feeling or flu-like symptoms; light-colored stools; loss of appetite; nausea; right upper belly pain; unusually weak or tired; yellowing of the eyes or skin Side effects that usually do not require medical attention (report to your doctor or health care professional if they continue or are bothersome):  confusion  decreased hearing  diarrhea  facial flushing  hair loss  headache  loss of appetite  missed menstrual periods  signs and symptoms of low red blood cells or anemia such as  unusually weak or tired; feeling faint or lightheaded; falls  skin discoloration This list may not describe all possible side effects. Call your doctor for medical advice about side effects. You may report side effects to FDA at 1-800-FDA-1088. Where should I keep my medicine? This drug is given in a hospital or clinic and will not be stored at home. NOTE: This sheet is a summary. It may not cover all possible information. If you have questions about this medicine, talk to your doctor, pharmacist, or health care provider.  2021 Elsevier/Gold Standard (2019-01-02 09:53:29)  Docetaxel injection What is this medicine? DOCETAXEL (doe se TAX el) is a chemotherapy drug. It targets fast dividing cells, like cancer cells, and causes these cells to die. This medicine is used to treat many types of cancers like breast cancer, certain stomach cancers, head and neck cancer, lung cancer, and prostate cancer. This medicine may be used for other purposes; ask your health care provider or pharmacist if you have questions. COMMON BRAND NAME(S): Docefrez, Taxotere What should I tell  my health care provider before I take this medicine? They need to know if you have any of these conditions:  infection (especially a virus infection such as chickenpox, cold sores, or herpes)  liver disease  low blood counts, like low white cell, platelet, or red cell counts  an unusual or allergic reaction to docetaxel, polysorbate 80, other chemotherapy agents, other medicines, foods, dyes, or preservatives  pregnant or trying to get pregnant  breast-feeding How should I use this medicine? This drug is given as an infusion into a vein. It is administered in a hospital or clinic by a specially trained health care professional. Talk to your pediatrician regarding the use of this medicine in children. Special care may be needed. Overdosage: If you think you have taken too much of this medicine contact a poison control center  or emergency room at once. NOTE: This medicine is only for you. Do not share this medicine with others. What if I miss a dose? It is important not to miss your dose. Call your doctor or health care professional if you are unable to keep an appointment. What may interact with this medicine? Do not take this medicine with any of the following medications:  live virus vaccines This medicine may also interact with the following medications:  aprepitant  certain antibiotics like erythromycin or clarithromycin  certain antivirals for HIV or hepatitis  certain medicines for fungal infections like fluconazole, itraconazole, ketoconazole, posaconazole, or voriconazole  cimetidine  ciprofloxacin  conivaptan  cyclosporine  dronedarone  fluvoxamine  grapefruit juice  imatinib  verapamil This list may not describe all possible interactions. Give your health care provider a list of all the medicines, herbs, non-prescription drugs, or dietary supplements you use. Also tell them if you smoke, drink alcohol, or use illegal drugs. Some items may interact with your medicine. What should I watch for while using this medicine? Your condition will be monitored carefully while you are receiving this medicine. You will need important blood work done while you are taking this medicine. Call your doctor or health care professional for advice if you get a fever, chills or sore throat, or other symptoms of a cold or flu. Do not treat yourself. This drug decreases your body's ability to fight infections. Try to avoid being around people who are sick. Some products may contain alcohol. Ask your health care professional if this medicine contains alcohol. Be sure to tell all health care professionals you are taking this medicine. Certain medicines, like metronidazole and disulfiram, can cause an unpleasant reaction when taken with alcohol. The reaction includes flushing, headache, nausea, vomiting, sweating,  and increased thirst. The reaction can last from 30 minutes to several hours. You may get drowsy or dizzy. Do not drive, use machinery, or do anything that needs mental alertness until you know how this medicine affects you. Do not stand or sit up quickly, especially if you are an older patient. This reduces the risk of dizzy or fainting spells. Alcohol may interfere with the effect of this medicine. Talk to your health care professional about your risk of cancer. You may be more at risk for certain types of cancer if you take this medicine. Do not become pregnant while taking this medicine or for 6 months after stopping it. Women should inform their doctor if they wish to become pregnant or think they might be pregnant. There is a potential for serious side effects to an unborn child. Talk to your health care professional or pharmacist for  more information. Do not breast-feed an infant while taking this medicine or for 1 week after stopping it. Males who get this medicine must use a condom during sex with females who can get pregnant. If you get a woman pregnant, the baby could have birth defects. The baby could die before they are born. You will need to continue wearing a condom for 3 months after stopping the medicine. Tell your health care provider right away if your partner becomes pregnant while you are taking this medicine. This may interfere with the ability to father a child. You should talk to your doctor or health care professional if you are concerned about your fertility. What side effects may I notice from receiving this medicine? Side effects that you should report to your doctor or health care professional as soon as possible:  allergic reactions like skin rash, itching or hives, swelling of the face, lips, or tongue  blurred vision  breathing problems  changes in vision  low blood counts - This drug may decrease the number of white blood cells, red blood cells and platelets. You may  be at increased risk for infections and bleeding.  nausea and vomiting  pain, redness or irritation at site where injected  pain, tingling, numbness in the hands or feet  redness, blistering, peeling, or loosening of the skin, including inside the mouth  signs of decreased platelets or bleeding - bruising, pinpoint red spots on the skin, black, tarry stools, nosebleeds  signs of decreased red blood cells - unusually weak or tired, fainting spells, lightheadedness  signs of infection - fever or chills, cough, sore throat, pain or difficulty passing urine  swelling of the ankle, feet, hands Side effects that usually do not require medical attention (report to your doctor or health care professional if they continue or are bothersome):  constipation  diarrhea  fingernail or toenail changes  hair loss  loss of appetite  mouth sores  muscle pain This list may not describe all possible side effects. Call your doctor for medical advice about side effects. You may report side effects to FDA at 1-800-FDA-1088. Where should I keep my medicine? This drug is given in a hospital or clinic and will not be stored at home. NOTE: This sheet is a summary. It may not cover all possible information. If you have questions about this medicine, talk to your doctor, pharmacist, or health care provider.  2021 Elsevier/Gold Standard (2019-02-27 19:50:31)

## 2020-09-16 NOTE — Progress Notes (Signed)
[facility] [place] NOTE  Patient Care Team: Sofie Hartigan, MD as PCP - General (Family Medicine) Theodore Demark, RN as Oncology Nurse Navigator Cammie Sickle, MD as Consulting Physician (Hematology and Oncology)  CHIEF COMPLAINTS/PURPOSE OF CONSULTATION: Breast cancer  #  Oncology History Overview Note  # JAN 2022-  0.5 cm irregular UPPER-OUTER LEFT breast mass with calcifications extending 1.5 cm anteroinferiorly, suspicious for malignancy. Ultrasound-guided biopsy of the mass is recommended. Stereotactic guided biopsy of the anterior/inferior most calcifications extending from this mass is also recommended. 2. Indeterminate 0.4 cm group of UPPER-OUTER LEFT breast calcifications, located 2.5 cm from the irregular mass above. 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 This was found to be concordant by Dr. Zerita Boers.   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 This was found to be concordant by Dr. Zerita Boers.   Pathology revealed COLUMNAR CELL CHANGE WITH ASSOCIATED CALCIFICATIONS, NEGATIVE FOR ATYPIA AND MALIGNANCY of the LEFT breast calcifications, medial upper outer quadrant, coil clip. This was found to be concordant by Dr. Zerita Boers.   Of note, the area of invasive mammary carcinoma spans at least 1.9 cm mammographically.  # BREAST, LEFT 2:00 7 CM FN; ULTRASOUND-GUIDED BIOPSY (VISION CLIP):  - INVASIVE MAMMARY CARCINOMA, NO SPECIAL TYPE.  Size of invasive carcinoma: 5 mm in this sample  Histologic grade  of invasive carcinoma: Grade 3                       Glandular/tubular differentiation score: 3                       Nuclear pleomorphism score: 3                       Mitotic rate score: 2                       Total score: 8  Ductal carcinoma in situ: Present, high-grade with calcification  Lymphovascular invasion: Not identified   B.  BREAST WITH CALCIFICATIONS, LEFT LATERAL UPPER OUTER QUADRANT;  STEREOTACTIC BIOPSY (X SHAPED CLIP):  - INVASIVE MAMMARY CARCINOMA, NO SPECIAL TYPE.  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  - CALCIFICATIONS ASSOCIATED WITH DCIS AND SCLEROSING ADENOSIS.  - AREAS SUSPICIOUS FOR LYMPHOVASCULAR INVASION.  Size of invasive carcinoma: 4 mm in this sample  Histologic grade of invasive carcinoma: Grade 3                       Glandular/tubular differentiation score: 3                       Nuclear pleomorphism score: 3                       Mitotic rate score: 2                       Total score: 8   C.  BREAST WITH CALCIFICATIONS, LEFT MEDIAL UPPER OUTER  QUADRANT;  STEREOTACTIC BIOPSY (COIL-SHAPED CLIP):  - COLUMNAR CELL CHANGE WITH ASSOCIATED CALCIFICATIONS.  - NEGATIVE FOR ATYPIA AND MALIGNANCY. BIOPSY- ER > 90%; PR-NEG; Her-2 NEG -----------------------------------------------------------------  TNM Descriptors: M (multifocal)  pT1a  Regional Lymph Nodes Modifier: SN  pN0  pM - Not applicable   #  HIGH RISK ONCOTYPE- RS score =35; which unfortunately translates to up to 23% risk of distant recurrence at 9 years.  Benefit from chemotherapy is more than 15%.  # JUne 2nd 2022- START TAX-CYTOXAN q3w x4.   Carcinoma of upper-outer quadrant of left breast in female, estrogen receptor positive ([place])  06/06/2020 Initial Diagnosis   Carcinoma of upper-outer quadrant of left breast in female, estrogen receptor positive ([place])   06/06/2020 Cancer Staging   Staging form: Breast, AJCC 8th Edition - Clinical: Stage IB  (cT1b, cN0, cM0, G3, ER+, PR-, HER2-) - Signed by Cammie Sickle, MD on 06/06/2020  Stage prefix: Initial diagnosis    09/16/2020 -  Chemotherapy    Patient is on Treatment Plan: BREAST TC Q21D          HISTORY OF PRESENTING ILLNESS:  [place] 65 y.o.  female newly diagnosed stage I breast cancer ER/PR positive HER2 negative status postlumpectomy -high risk Oncotype currently on adjuvant chemotherapy  Patient denies any nausea vomiting abdominal pain.  Denies any significant diarrhea.  No tingling or numbness in extremities.  Review of Systems  Constitutional:  Negative for chills, diaphoresis, fever, malaise/fatigue and weight loss.  HENT:  Negative for nosebleeds and sore throat.   Eyes:  Negative for double vision.  Respiratory:  Negative for cough, hemoptysis, sputum production, shortness of breath and wheezing.   Cardiovascular:  Negative for chest pain, palpitations, orthopnea and leg swelling.  Gastrointestinal:  Negative for abdominal pain, blood in stool, constipation, diarrhea, heartburn, melena, nausea and vomiting.  Genitourinary:  Negative for dysuria, frequency and urgency.  Musculoskeletal:  Negative for back pain and joint pain.  Skin: Negative.  Negative for itching and rash.  Neurological:  Negative for dizziness, tingling, focal weakness, weakness and headaches.  Endo/Heme/Allergies:  Does not bruise/bleed easily.  Psychiatric/Behavioral:  Negative for depression. The patient is not nervous/anxious and does not have insomnia.     MEDICAL HISTORY:  Past Medical History:  Diagnosis Date   Carcinoma of upper-outer quadrant of left breast in female, estrogen receptor positive ([place]) 06/06/2020   Diabetes mellitus without complication (HCC)    diet controlled   Family history of breast cancer    Family history of ovarian cancer    GERD (gastroesophageal reflux disease)    History of hiatal hernia    Hyperlipidemia    Hypertension    Migraines      SURGICAL HISTORY: Past Surgical History:  Procedure Laterality Date   BREAST BIOPSY Left 05/29/2020   affirm bx "X" clp-invasive   BREAST BIOPSY Left 05/29/2020   Affirm bx"Coil" clip-invasive   BREAST BIOPSY Right 2001   excisional biopsy   BREAST EXCISIONAL BIOPSY Right 06/19/1999   neg   BREAST LUMPECTOMY     BREAST LUMPECTOMY,RADIO FREQ LOCALIZER,AXILLARY SENTINEL LYMPH NODE BIOPSY Left 07/12/2020   Procedure: BREAST LUMPECTOMY,RADIO FREQ LOCALIZER,AXILLARY SENTINEL LYMPH NODE BIOPSY;  Surgeon: Fredirick Maudlin, MD;  Location: [facility] ORS;  Service: General;  Laterality: Left;   COLONOSCOPY  02/15/2009   EXCISION OF BREAST BIOPSY Left 07/12/2020   tag placement 07/10/20 vison tag [id number]; hourglass tag [id number]; cylinder tag [id number]   TUBAL LIGATION      SOCIAL  HISTORY: Social History   Socioeconomic History   Marital status: Married    Spouse name: Darrell   Number of children: Not on file   Years of education: Not on file   Highest education level: Not on file  Occupational History   Not on file  Tobacco Use   Smoking status: Former    Years: 20.00    Pack years: 0.00    Types: Cigarettes    Quit date: 2020    Years since quitting: 2.4   Smokeless tobacco: Never  Vaping Use   Vaping Use: Never used  Substance and Sexual Activity   Alcohol use: Never   Drug use: Never   Sexual activity: Not on file  Other Topics Concern   Not on file  Social History Narrative   Not on file   Social Determinants of Health   Financial Resource Strain: Not on file  Food Insecurity: Not on file  Transportation Needs: Not on file  Physical Activity: Not on file  Stress: Not on file  Social Connections: Not on file  Intimate Partner Violence: Not on file    FAMILY HISTORY: Family History  Problem Relation Age of Onset   Ovarian cancer Sister 27   Breast cancer Maternal Grandmother        dx 76s   Breast cancer Paternal Grandmother     ALLERGIES:  has No Known  Allergies.  MEDICATIONS:  Current Outpatient Medications  Medication Sig Dispense Refill   aspirin-acetaminophen-caffeine (EXCEDRIN MIGRAINE) 250-250-65 MG tablet Take 1 tablet by mouth daily as needed (headaches).     atorvastatin (LIPITOR) 10 MG tablet Take 10 mg by mouth daily.     Cholecalciferol 25 MCG (1000 UT) tablet Take 1,000 Units by mouth daily.     dexamethasone (DECADRON) 4 MG tablet Take one pill AM & PM for 2 days. TAKE day prior & day AFTER chemo. Do NOT take on day of chemo. 20 tablet 0   magnesium oxide (MAG-OX) 400 MG tablet Take 400 mg by mouth daily.     Omega-3 Fatty Acids (FISH OIL) 1000 MG CAPS Take 1,000 mg by mouth daily.     omeprazole (PRILOSEC) 20 MG capsule Take 20 mg by mouth daily.     ondansetron (ZOFRAN) 8 MG tablet One pill every 8 hours as needed for nausea/vomitting. 40 tablet 1   prochlorperazine (COMPAZINE) 10 MG tablet Take 1 tablet (10 mg total) by mouth every 6 (six) hours as needed for nausea or vomiting. 40 tablet 1   valsartan (DIOVAN) 80 MG tablet Take 80 mg by mouth daily.     ibuprofen (ADVIL) 800 MG tablet Take 1 tablet (800 mg total) by mouth every 8 (eight) hours as needed for headache or moderate pain. 30 tablet 0   No current facility-administered medications for this visit.      Marland Kitchen  PHYSICAL EXAMINATION: ECOG PERFORMANCE STATUS: 0 - Asymptomatic  Vitals:   09/16/20 0836  BP: 134/89  Pulse: 73  Resp: 18  Temp: 97.8 F (36.6 C)   Filed Weights   09/16/20 0836  Weight: 157 lb (71.2 kg)    Physical Exam HENT:     Head: Normocephalic and atraumatic.     Mouth/Throat:     Pharynx: No oropharyngeal exudate.  Eyes:     Pupils: Pupils are equal, round, and reactive to light.  Cardiovascular:     Rate and Rhythm: Normal rate and regular rhythm.  Pulmonary:     Effort: No respiratory  distress.     Breath sounds: No wheezing.  Abdominal:     General: Bowel sounds are normal. There is no distension.     Palpations: Abdomen  is soft. There is no mass.     Tenderness: no abdominal tenderness There is no guarding or rebound.  Musculoskeletal:        General: No tenderness. Normal range of motion.     Cervical back: Normal range of motion and neck supple.  Skin:    General: Skin is warm.  Neurological:     Mental Status: She is alert and oriented to person, place, and time.  Psychiatric:        Mood and Affect: Affect normal.     LABORATORY DATA:  I have reviewed the data as listed Lab Results  Component Value Date   WBC 13.6 (H) 09/26/2020   HGB 11.8 (L) 09/26/2020   HCT 35.1 (L) 09/26/2020   MCV 89.8 09/26/2020   PLT 183 09/26/2020   Recent Labs    09/16/20 0813 09/26/20 1347  NA 139 136  K 4.1 3.8  CL 105 102  CO2 24 26  GLUCOSE 164* 118*  BUN 23 15  CREATININE 0.82 0.69  CALCIUM 9.6 8.7*  GFRNONAA >60 >60  PROT 7.4  --   ALBUMIN 4.5  --   AST 20  --   ALT 19  --   ALKPHOS 71  --   BILITOT 0.5  --     RADIOGRAPHIC STUDIES: I have personally reviewed the radiological images as listed and agreed with the findings in the report. No results found.  ASSESSMENT & PLAN:   Carcinoma of upper-outer quadrant of left breast in female, estrogen receptor positive ([place]) #Stage I breast cancer- pT1a [5 mm; 4 mm] grade 3; pN0-postlumpectomy no residual malignancy noted.  ER positive PR negative; HER2 negative. HIGH RISK ONCOTYPE- RS score =35; which unfortunately translates to up to 23% risk of distant recurrence at 9 years.  Benefit from chemotherapy is more than 15%.  Currently on adjuvant Taxotere Cytoxan  # Proceed with Taxotere-Cytoxan- cycle #2  Today.Labs today reviewed;  acceptable for treatment today.    # DISPOSITION: # chemo today # labs in 10 days- cbc/bmp # follow up in 3weeks- MD; labs- cbc/cmp; Cytoxan-Taxoeter;D-2 udenyca Dr.B  All questions were answered. The patient/family knows to call the clinic with any problems, questions or concerns.   Cammie Sickle,  MD 09/29/2020 6:22 PM

## 2020-09-17 ENCOUNTER — Telehealth: Payer: Self-pay

## 2020-09-17 ENCOUNTER — Inpatient Hospital Stay: Payer: Medicare HMO

## 2020-09-17 DIAGNOSIS — Z5111 Encounter for antineoplastic chemotherapy: Secondary | ICD-10-CM | POA: Diagnosis not present

## 2020-09-17 DIAGNOSIS — Z170 Estrogen receptor positive status [ER+]: Secondary | ICD-10-CM

## 2020-09-17 DIAGNOSIS — C50412 Malignant neoplasm of upper-outer quadrant of left female breast: Secondary | ICD-10-CM

## 2020-09-17 MED ORDER — PEGFILGRASTIM-CBQV 6 MG/0.6ML ~~LOC~~ SOSY
6.0000 mg | PREFILLED_SYRINGE | Freq: Once | SUBCUTANEOUS | Status: AC
  Administered 2020-09-17: 6 mg via SUBCUTANEOUS
  Filled 2020-09-17: qty 0.6

## 2020-09-17 NOTE — Telephone Encounter (Signed)
Telephone call to patient for follow up after receiving first infusion.   No answer but left message stating we were calling to check on them.  Encouraged patient to call for any questions or concerns.   

## 2020-09-26 ENCOUNTER — Inpatient Hospital Stay: Payer: Medicare HMO

## 2020-09-26 DIAGNOSIS — Z170 Estrogen receptor positive status [ER+]: Secondary | ICD-10-CM

## 2020-09-26 DIAGNOSIS — C50412 Malignant neoplasm of upper-outer quadrant of left female breast: Secondary | ICD-10-CM

## 2020-09-26 DIAGNOSIS — Z5111 Encounter for antineoplastic chemotherapy: Secondary | ICD-10-CM | POA: Diagnosis not present

## 2020-09-26 LAB — CBC WITH DIFFERENTIAL/PLATELET
Abs Immature Granulocytes: 0.98 10*3/uL — ABNORMAL HIGH (ref 0.00–0.07)
Basophils Absolute: 0.1 10*3/uL (ref 0.0–0.1)
Basophils Relative: 0 %
Eosinophils Absolute: 0 10*3/uL (ref 0.0–0.5)
Eosinophils Relative: 0 %
HCT: 35.1 % — ABNORMAL LOW (ref 36.0–46.0)
Hemoglobin: 11.8 g/dL — ABNORMAL LOW (ref 12.0–15.0)
Immature Granulocytes: 7 %
Lymphocytes Relative: 18 %
Lymphs Abs: 2.4 10*3/uL (ref 0.7–4.0)
MCH: 30.2 pg (ref 26.0–34.0)
MCHC: 33.6 g/dL (ref 30.0–36.0)
MCV: 89.8 fL (ref 80.0–100.0)
Monocytes Absolute: 0.9 10*3/uL (ref 0.1–1.0)
Monocytes Relative: 7 %
Neutro Abs: 9.2 10*3/uL — ABNORMAL HIGH (ref 1.7–7.7)
Neutrophils Relative %: 68 %
Platelets: 183 10*3/uL (ref 150–400)
RBC: 3.91 MIL/uL (ref 3.87–5.11)
RDW: 13 % (ref 11.5–15.5)
WBC: 13.6 10*3/uL — ABNORMAL HIGH (ref 4.0–10.5)
nRBC: 0.4 % — ABNORMAL HIGH (ref 0.0–0.2)

## 2020-09-26 LAB — BASIC METABOLIC PANEL
Anion gap: 8 (ref 5–15)
BUN: 15 mg/dL (ref 8–23)
CO2: 26 mmol/L (ref 22–32)
Calcium: 8.7 mg/dL — ABNORMAL LOW (ref 8.9–10.3)
Chloride: 102 mmol/L (ref 98–111)
Creatinine, Ser: 0.69 mg/dL (ref 0.44–1.00)
GFR, Estimated: >60 mL/min (ref >60)
Glucose, Bld: 118 mg/dL — ABNORMAL HIGH (ref 70–99)
Potassium: 3.8 mmol/L (ref 3.5–5.1)
Sodium: 136 mmol/L (ref 135–145)

## 2020-09-29 ENCOUNTER — Encounter: Payer: Self-pay | Admitting: Internal Medicine

## 2020-10-01 ENCOUNTER — Encounter: Payer: Self-pay | Admitting: *Deleted

## 2020-10-07 ENCOUNTER — Other Ambulatory Visit: Payer: Self-pay

## 2020-10-07 ENCOUNTER — Inpatient Hospital Stay: Payer: Medicare HMO

## 2020-10-07 ENCOUNTER — Inpatient Hospital Stay (HOSPITAL_BASED_OUTPATIENT_CLINIC_OR_DEPARTMENT_OTHER): Payer: Medicare HMO | Admitting: Internal Medicine

## 2020-10-07 ENCOUNTER — Encounter: Payer: Self-pay | Admitting: Internal Medicine

## 2020-10-07 DIAGNOSIS — Z170 Estrogen receptor positive status [ER+]: Secondary | ICD-10-CM

## 2020-10-07 DIAGNOSIS — C50412 Malignant neoplasm of upper-outer quadrant of left female breast: Secondary | ICD-10-CM

## 2020-10-07 DIAGNOSIS — Z5111 Encounter for antineoplastic chemotherapy: Secondary | ICD-10-CM | POA: Diagnosis not present

## 2020-10-07 LAB — COMPREHENSIVE METABOLIC PANEL
ALT: 20 U/L (ref 0–44)
AST: 16 U/L (ref 15–41)
Albumin: 4 g/dL (ref 3.5–5.0)
Alkaline Phosphatase: 70 U/L (ref 38–126)
Anion gap: 6 (ref 5–15)
BUN: 18 mg/dL (ref 8–23)
CO2: 26 mmol/L (ref 22–32)
Calcium: 9.1 mg/dL (ref 8.9–10.3)
Chloride: 106 mmol/L (ref 98–111)
Creatinine, Ser: 0.69 mg/dL (ref 0.44–1.00)
GFR, Estimated: >60 mL/min (ref >60)
Glucose, Bld: 124 mg/dL — ABNORMAL HIGH (ref 70–99)
Potassium: 3.6 mmol/L (ref 3.5–5.1)
Sodium: 138 mmol/L (ref 135–145)
Total Bilirubin: 0.5 mg/dL (ref 0.3–1.2)
Total Protein: 6.5 g/dL (ref 6.5–8.1)

## 2020-10-07 LAB — CBC WITH DIFFERENTIAL/PLATELET
Abs Immature Granulocytes: 0.03 10*3/uL (ref 0.00–0.07)
Basophils Absolute: 0 10*3/uL (ref 0.0–0.1)
Basophils Relative: 0 %
Eosinophils Absolute: 0 10*3/uL (ref 0.0–0.5)
Eosinophils Relative: 0 %
HCT: 35.3 % — ABNORMAL LOW (ref 36.0–46.0)
Hemoglobin: 11.7 g/dL — ABNORMAL LOW (ref 12.0–15.0)
Immature Granulocytes: 0 %
Lymphocytes Relative: 22 %
Lymphs Abs: 1.8 10*3/uL (ref 0.7–4.0)
MCH: 30.4 pg (ref 26.0–34.0)
MCHC: 33.1 g/dL (ref 30.0–36.0)
MCV: 91.7 fL (ref 80.0–100.0)
Monocytes Absolute: 0.6 10*3/uL (ref 0.1–1.0)
Monocytes Relative: 7 %
Neutro Abs: 5.9 10*3/uL (ref 1.7–7.7)
Neutrophils Relative %: 71 %
Platelets: 317 10*3/uL (ref 150–400)
RBC: 3.85 MIL/uL — ABNORMAL LOW (ref 3.87–5.11)
RDW: 13.4 % (ref 11.5–15.5)
WBC: 8.4 10*3/uL (ref 4.0–10.5)
nRBC: 0 % (ref 0.0–0.2)

## 2020-10-07 MED ORDER — SODIUM CHLORIDE 0.9 % IV SOLN
10.0000 mg | Freq: Once | INTRAVENOUS | Status: AC
  Administered 2020-10-07: 10 mg via INTRAVENOUS
  Filled 2020-10-07: qty 10

## 2020-10-07 MED ORDER — SODIUM CHLORIDE 0.9 % IV SOLN
75.0000 mg/m2 | Freq: Once | INTRAVENOUS | Status: AC
  Administered 2020-10-07: 130 mg via INTRAVENOUS
  Filled 2020-10-07: qty 13

## 2020-10-07 MED ORDER — SODIUM CHLORIDE 0.9 % IV SOLN
Freq: Once | INTRAVENOUS | Status: AC
Start: 2020-10-07 — End: 2020-10-07
  Filled 2020-10-07: qty 250

## 2020-10-07 MED ORDER — PALONOSETRON HCL INJECTION 0.25 MG/5ML
0.2500 mg | Freq: Once | INTRAVENOUS | Status: AC
  Administered 2020-10-07: 0.25 mg via INTRAVENOUS
  Filled 2020-10-07: qty 5

## 2020-10-07 MED ORDER — SODIUM CHLORIDE 0.9 % IV SOLN
1000.0000 mg | Freq: Once | INTRAVENOUS | Status: AC
  Administered 2020-10-07: 1000 mg via INTRAVENOUS
  Filled 2020-10-07: qty 50

## 2020-10-07 NOTE — Progress Notes (Signed)
one [place] NOTE  Patient Care Team: Sofie Hartigan, MD as PCP - General (Family Medicine) Theodore Demark, RN as Oncology Nurse Navigator Cammie Sickle, MD as Consulting Physician (Hematology and Oncology)  CHIEF COMPLAINTS/PURPOSE OF CONSULTATION: Breast cancer  #  Oncology History Overview Note  # JAN 2022-  0.5 cm irregular UPPER-OUTER LEFT breast mass with calcifications extending 1.5 cm anteroinferiorly, suspicious for malignancy. Ultrasound-guided biopsy of the mass is recommended. Stereotactic guided biopsy of the anterior/inferior most calcifications extending from this mass is also recommended. 2. Indeterminate 0.4 cm group of UPPER-OUTER LEFT breast calcifications, located 2.5 cm from the irregular mass above. 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 This was found to be concordant by Dr. Zerita Boers.   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 This was found to be concordant by Dr. Zerita Boers.   Pathology revealed COLUMNAR CELL CHANGE WITH ASSOCIATED CALCIFICATIONS, NEGATIVE FOR ATYPIA AND MALIGNANCY of the LEFT breast calcifications, medial upper outer quadrant, coil clip. This was found to be concordant by Dr. Zerita Boers.   Of note, the area of invasive mammary carcinoma spans at least 1.9 cm mammographically.  # BREAST, LEFT 2:00 7 CM FN; ULTRASOUND-GUIDED BIOPSY (VISION CLIP):  - INVASIVE MAMMARY CARCINOMA, NO SPECIAL TYPE.  Size of invasive carcinoma: 5 mm in this sample  Histologic grade  of invasive carcinoma: Grade 3                       Glandular/tubular differentiation score: 3                       Nuclear pleomorphism score: 3                       Mitotic rate score: 2                       Total score: 8  Ductal carcinoma in situ: Present, high-grade with calcification  Lymphovascular invasion: Not identified   B.  BREAST WITH CALCIFICATIONS, LEFT LATERAL UPPER OUTER QUADRANT;  STEREOTACTIC BIOPSY (X SHAPED CLIP):  - INVASIVE MAMMARY CARCINOMA, NO SPECIAL TYPE.  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  - CALCIFICATIONS ASSOCIATED WITH DCIS AND SCLEROSING ADENOSIS.  - AREAS SUSPICIOUS FOR LYMPHOVASCULAR INVASION.  Size of invasive carcinoma: 4 mm in this sample  Histologic grade of invasive carcinoma: Grade 3                       Glandular/tubular differentiation score: 3                       Nuclear pleomorphism score: 3                       Mitotic rate score: 2                       Total score: 8   C.  BREAST WITH CALCIFICATIONS, LEFT MEDIAL UPPER OUTER  QUADRANT;  STEREOTACTIC BIOPSY (COIL-SHAPED CLIP):  - COLUMNAR CELL CHANGE WITH ASSOCIATED CALCIFICATIONS.  - NEGATIVE FOR ATYPIA AND MALIGNANCY. BIOPSY- ER > 90%; PR-NEG; Her-2 NEG -----------------------------------------------------------------  TNM Descriptors: M (multifocal)  pT1a  Regional Lymph Nodes Modifier: SN  pN0  pM - Not applicable   #  HIGH RISK ONCOTYPE- RS score =35; which unfortunately translates to up to 23% risk of distant recurrence at 9 years.  Benefit from chemotherapy is more than 15%.  # JUne 2nd 2022- START TAX-CYTOXAN q3w x4.   Carcinoma of upper-outer quadrant of left breast in female, estrogen receptor positive ([place])  06/06/2020 Initial Diagnosis   Carcinoma of upper-outer quadrant of left breast in female, estrogen receptor positive ([place])   06/06/2020 Cancer Staging   Staging form: Breast, AJCC 8th Edition - Clinical: Stage IB  (cT1b, cN0, cM0, G3, ER+, PR-, HER2-) - Signed by Cammie Sickle, MD on 06/06/2020  Stage prefix: Initial diagnosis    09/16/2020 -  Chemotherapy    Patient is on Treatment Plan: BREAST TC Q21D          HISTORY OF PRESENTING ILLNESS:  [place] 65 y.o.  female newly diagnosed stage I breast cancer ER/PR positive HER2 negative status postlumpectomy -high risk Oncotype currently on adjuvant chemotherapy with Taxotere Cytoxan.  Patient complains of mild myalgias arthralgias post udenyca  injection.  Mild diarrhea Resolved itself.  No tingling and numbness.  No swelling in the legs.  Intermittent headaches   Review of Systems  Constitutional:  Negative for chills, diaphoresis, fever, malaise/fatigue and weight loss.  HENT:  Negative for nosebleeds and sore throat.   Eyes:  Negative for double vision.  Respiratory:  Negative for cough, hemoptysis, sputum production, shortness of breath and wheezing.   Cardiovascular:  Negative for chest pain, palpitations, orthopnea and leg swelling.  Gastrointestinal:  Positive for diarrhea. Negative for abdominal pain, blood in stool, constipation, heartburn, melena, nausea and vomiting.  Genitourinary:  Negative for dysuria, frequency and urgency.  Musculoskeletal:  Positive for joint pain and myalgias. Negative for back pain.  Skin: Negative.  Negative for itching and rash.  Neurological:  Negative for dizziness, tingling, focal weakness, weakness and headaches.  Endo/Heme/Allergies:  Does not bruise/bleed easily.  Psychiatric/Behavioral:  Negative for depression. The patient is not nervous/anxious and does not have insomnia.     MEDICAL HISTORY:  Past Medical History:  Diagnosis Date   Carcinoma of upper-outer quadrant of left breast in female, estrogen receptor positive ([place]) 06/06/2020   Diabetes mellitus without complication (HCC)    diet controlled   Family history of breast cancer    Family history of ovarian cancer    GERD  (gastroesophageal reflux disease)    History of hiatal hernia    Hyperlipidemia    Hypertension    Migraines     SURGICAL HISTORY: Past Surgical History:  Procedure Laterality Date   BREAST BIOPSY Left 05/29/2020   affirm bx "X" clp-invasive   BREAST BIOPSY Left 05/29/2020   Affirm bx"Coil" clip-invasive   BREAST BIOPSY Right 2001   excisional biopsy   BREAST EXCISIONAL BIOPSY Right 06/19/1999   neg   BREAST LUMPECTOMY     BREAST LUMPECTOMY,RADIO FREQ LOCALIZER,AXILLARY SENTINEL LYMPH NODE BIOPSY Left 07/12/2020   Procedure: BREAST LUMPECTOMY,RADIO FREQ LOCALIZER,AXILLARY SENTINEL LYMPH NODE BIOPSY;  Surgeon: Fredirick Maudlin, MD;  Location: [facility] ORS;  Service: General;  Laterality: Left;   COLONOSCOPY  02/15/2009   EXCISION OF BREAST BIOPSY Left 07/12/2020   tag placement 07/10/20  vison tag [id number]; hourglass tag [id number]; cylinder tag [id number]   TUBAL LIGATION      SOCIAL HISTORY: Social History   Socioeconomic History   Marital status: Married    Spouse name: Darrell   Number of children: Not on file   Years of education: Not on file   Highest education level: Not on file  Occupational History   Not on file  Tobacco Use   Smoking status: Former    Years: 20.00    Pack years: 0.00    Types: Cigarettes    Quit date: 2020    Years since quitting: 2.4   Smokeless tobacco: Never  Vaping Use   Vaping Use: Never used  Substance and Sexual Activity   Alcohol use: Never   Drug use: Never   Sexual activity: Not on file  Other Topics Concern   Not on file  Social History Narrative   Not on file   Social Determinants of Health   Financial Resource Strain: Not on file  Food Insecurity: Not on file  Transportation Needs: Not on file  Physical Activity: Not on file  Stress: Not on file  Social Connections: Not on file  Intimate Partner Violence: Not on file    FAMILY HISTORY: Family History  Problem Relation Age of Onset   Ovarian cancer Sister 24   Breast cancer  Maternal Grandmother        dx 33s   Breast cancer Paternal Grandmother     ALLERGIES:  has No Known Allergies.  MEDICATIONS:  Current Outpatient Medications  Medication Sig Dispense Refill   aspirin-acetaminophen-caffeine (EXCEDRIN MIGRAINE) 250-250-65 MG tablet Take 1 tablet by mouth daily as needed (headaches).     atorvastatin (LIPITOR) 10 MG tablet Take 10 mg by mouth daily.     Cholecalciferol 25 MCG (1000 UT) tablet Take 1,000 Units by mouth daily.     dexamethasone (DECADRON) 4 MG tablet Take one pill AM & PM for 2 days. TAKE day prior & day AFTER chemo. Do NOT take on day of chemo. 20 tablet 0   magnesium oxide (MAG-OX) 400 MG tablet Take 400 mg by mouth daily.     Omega-3 Fatty Acids (FISH OIL) 1000 MG CAPS Take 1,000 mg by mouth daily.     omeprazole (PRILOSEC) 20 MG capsule Take 20 mg by mouth daily.     ondansetron (ZOFRAN) 8 MG tablet One pill every 8 hours as needed for nausea/vomitting. 40 tablet 1   prochlorperazine (COMPAZINE) 10 MG tablet Take 1 tablet (10 mg total) by mouth every 6 (six) hours as needed for nausea or vomiting. 40 tablet 1   valsartan (DIOVAN) 80 MG tablet Take 80 mg by mouth daily.     No current facility-administered medications for this visit.      Marland Kitchen  PHYSICAL EXAMINATION: ECOG PERFORMANCE STATUS: 0 - Asymptomatic  Vitals:   10/07/20 0837  BP: 133/75  Pulse: 65  Resp: 20  Temp: 98 F (36.7 C)   Filed Weights   10/07/20 0837  Weight: 157 lb 12.8 oz (71.6 kg)    Physical Exam HENT:     Head: Normocephalic and atraumatic.     Mouth/Throat:     Pharynx: No oropharyngeal exudate.  Eyes:     Pupils: Pupils are equal, round, and reactive to light.  Cardiovascular:     Rate and Rhythm: Normal rate and regular rhythm.  Pulmonary:     Effort: No respiratory distress.     Breath sounds: No  wheezing.  Abdominal:     General: Bowel sounds are normal. There is no distension.     Palpations: Abdomen is soft. There is no mass.      Tenderness: There is no abdominal tenderness. There is no guarding or rebound.  Musculoskeletal:        General: No tenderness. Normal range of motion.     Cervical back: Normal range of motion and neck supple.  Skin:    General: Skin is warm.  Neurological:     Mental Status: She is alert and oriented to person, place, and time.  Psychiatric:        Mood and Affect: Affect normal.     LABORATORY DATA:  I have reviewed the data as listed Lab Results  Component Value Date   WBC 8.4 10/07/2020   HGB 11.7 (L) 10/07/2020   HCT 35.3 (L) 10/07/2020   MCV 91.7 10/07/2020   PLT 317 10/07/2020   Recent Labs    09/16/20 0813 09/26/20 1347 10/07/20 0819  NA 139 136 138  K 4.1 3.8 3.6  CL 105 102 106  CO2 '24 26 26  ' GLUCOSE 164* 118* 124*  BUN '23 15 18  ' CREATININE 0.82 0.69 0.69  CALCIUM 9.6 8.7* 9.1  GFRNONAA >60 >60 >60  PROT 7.4  --  6.5  ALBUMIN 4.5  --  4.0  AST 20  --  16  ALT 19  --  20  ALKPHOS 71  --  70  BILITOT 0.5  --  0.5    RADIOGRAPHIC STUDIES: I have personally reviewed the radiological images as listed and agreed with the findings in the report. No results found.  ASSESSMENT & PLAN:   Carcinoma of upper-outer quadrant of left breast in female, estrogen receptor positive ([place]) #Stage I breast cancer- pT1a [5 mm; 4 mm] grade 3; pN0-postlumpectomy no residual malignancy noted.  ER positive PR negative; HER2 negative. HIGH RISK ONCOTYPE- RS score =35; which unfortunately translates to up to 23% risk of distant recurrence at 9 years.  Benefit from chemotherapy is more than 15%.  Currently on adjuvant Taxotere Cytoxan  # Proceed with Taxotere-Cytoxan- cycle #2  Today.Labs today reviewed;  acceptable for treatment today.    # DISPOSITION: # chemo today # labs in 10 days- cbc/bmp # follow up in 3weeks- MD; labs- cbc/cmp; Cytoxan-Taxoeter;D-2 udenyca Dr.B  All questions were answered. The patient/family knows to call the clinic with any problems, questions or  concerns.   Cammie Sickle, MD 10/07/2020 9:09 AM

## 2020-10-07 NOTE — Patient Instructions (Signed)
[facility] ONCOLOGY  Discharge Instructions: Thank you for choosing [facility] to provide your oncology and hematology care.  If you have a lab appointment with the [facility], please go directly to the [facility] and check in at the registration area.  Wear comfortable clothing and clothing appropriate for easy access to any Portacath or PICC line.   We strive to give you quality time with your provider. You may need to reschedule your appointment if you arrive late (15 or more minutes).  Arriving late affects you and other patients whose appointments are after yours.  Also, if you miss three or more appointments without notifying the office, you may be dismissed from the clinic at the provider's discretion.      For prescription refill requests, have your pharmacy contact our office and allow 72 hours for refills to be completed.    Today you received the following chemotherapy and/or immunotherapy agents Taxotere & Cytoxan      To help prevent nausea and vomiting after your treatment, we encourage you to take your nausea medication as directed.  BELOW ARE SYMPTOMS THAT SHOULD BE REPORTED IMMEDIATELY: *FEVER GREATER THAN 100.4 F (38 C) OR HIGHER *CHILLS OR SWEATING *NAUSEA AND VOMITING THAT IS NOT CONTROLLED WITH YOUR NAUSEA MEDICATION *UNUSUAL SHORTNESS OF BREATH *UNUSUAL BRUISING OR BLEEDING *URINARY PROBLEMS (pain or burning when urinating, or frequent urination) *BOWEL PROBLEMS (unusual diarrhea, constipation, pain near the anus) TENDERNESS IN MOUTH AND THROAT WITH OR WITHOUT PRESENCE OF ULCERS (sore throat, sores in mouth, or a toothache) UNUSUAL RASH, SWELLING OR PAIN  UNUSUAL VAGINAL DISCHARGE OR ITCHING   Items with * indicate a potential emergency and should be followed up as soon as possible or go to the Emergency Department if any problems should occur.  Please show the CHEMOTHERAPY ALERT CARD or IMMUNOTHERAPY ALERT CARD at  check-in to the Emergency Department and triage nurse.  Should you have questions after your visit or need to cancel or reschedule your appointment, please contact [facility]  [phone] and follow the prompts.  Office hours are 8:00 a.m. to 4:30 p.m. Monday - Friday. Please note that voicemails left after 4:00 p.m. may not be returned until the following business day.  We are closed weekends and major holidays. You have access to a nurse at all times for urgent questions. Please call the main number to the clinic [phone] and follow the prompts.  For any non-urgent questions, you may also contact your provider using MyChart. We now offer e-Visits for anyone 34 and older to request care online for non-urgent symptoms. For details visit [email or web address].   Also download the MyChart app! Go to the app store, search "MyChart", open the app, select [facility], and log in with your MyChart username and password.  Due to Covid, a mask is required upon entering the hospital/clinic. If you do not have a mask, one will be given to you upon arrival. For doctor visits, patients may have 1 support person aged 41 or older with them. For treatment visits, patients cannot have anyone with them due to current Covid guidelines and our immunocompromised population.

## 2020-10-07 NOTE — Assessment & Plan Note (Addendum)
#  Stage I breast cancer- pT1a [5 mm; 4 mm] grade 3; pN0-postlumpectomy no residual malignancy noted.  ER positive PR negative; HER2 negative. HIGH RISK ONCOTYPE- RS score =35; which unfortunately translates to up to 23% risk of distant recurrence at 9 years.  Benefit from chemotherapy is more than 15%.  Currently on adjuvant Taxotere Cytoxan  # Proceed with Taxotere-Cytoxan- cycle #2.  Today.Labs today reviewed;  acceptable for treatment today.   # Myalgias/bone ache- sec to Taxotere/ udenyca.On Claritin; recommend Tylenol.   # Diarrhea- G-1 monitor closely.   # DISPOSITION: # chemo today # labs in 10 days- cbc/bmp # follow up in 3weeks- MD; labs- cbc/cmp; Cytoxan-Taxoeter;D-2 udenyca Dr.B

## 2020-10-08 ENCOUNTER — Inpatient Hospital Stay: Payer: Medicare HMO

## 2020-10-08 DIAGNOSIS — Z5111 Encounter for antineoplastic chemotherapy: Secondary | ICD-10-CM | POA: Diagnosis not present

## 2020-10-08 DIAGNOSIS — Z170 Estrogen receptor positive status [ER+]: Secondary | ICD-10-CM

## 2020-10-08 DIAGNOSIS — C50412 Malignant neoplasm of upper-outer quadrant of left female breast: Secondary | ICD-10-CM

## 2020-10-08 MED ORDER — PEGFILGRASTIM-CBQV 6 MG/0.6ML ~~LOC~~ SOSY
6.0000 mg | PREFILLED_SYRINGE | Freq: Once | SUBCUTANEOUS | Status: AC
  Administered 2020-10-08: 6 mg via SUBCUTANEOUS
  Filled 2020-10-08: qty 0.6

## 2020-10-17 ENCOUNTER — Inpatient Hospital Stay: Payer: Medicare HMO | Attending: Internal Medicine

## 2020-10-17 DIAGNOSIS — Z170 Estrogen receptor positive status [ER+]: Secondary | ICD-10-CM

## 2020-10-17 DIAGNOSIS — M898X9 Other specified disorders of bone, unspecified site: Secondary | ICD-10-CM | POA: Diagnosis not present

## 2020-10-17 DIAGNOSIS — R197 Diarrhea, unspecified: Secondary | ICD-10-CM | POA: Insufficient documentation

## 2020-10-17 DIAGNOSIS — Z87891 Personal history of nicotine dependence: Secondary | ICD-10-CM | POA: Diagnosis not present

## 2020-10-17 DIAGNOSIS — Z5111 Encounter for antineoplastic chemotherapy: Secondary | ICD-10-CM | POA: Insufficient documentation

## 2020-10-17 DIAGNOSIS — Z803 Family history of malignant neoplasm of breast: Secondary | ICD-10-CM | POA: Diagnosis not present

## 2020-10-17 DIAGNOSIS — C50412 Malignant neoplasm of upper-outer quadrant of left female breast: Secondary | ICD-10-CM | POA: Diagnosis present

## 2020-10-17 DIAGNOSIS — Z79899 Other long term (current) drug therapy: Secondary | ICD-10-CM | POA: Diagnosis not present

## 2020-10-17 DIAGNOSIS — Z5189 Encounter for other specified aftercare: Secondary | ICD-10-CM | POA: Insufficient documentation

## 2020-10-17 DIAGNOSIS — M791 Myalgia, unspecified site: Secondary | ICD-10-CM | POA: Insufficient documentation

## 2020-10-17 DIAGNOSIS — Z8041 Family history of malignant neoplasm of ovary: Secondary | ICD-10-CM | POA: Diagnosis not present

## 2020-10-17 LAB — CBC WITH DIFFERENTIAL/PLATELET
Abs Immature Granulocytes: 2.94 10*3/uL — ABNORMAL HIGH (ref 0.00–0.07)
Basophils Absolute: 0.1 10*3/uL (ref 0.0–0.1)
Basophils Relative: 0 %
Eosinophils Absolute: 0 10*3/uL (ref 0.0–0.5)
Eosinophils Relative: 0 %
HCT: 35.4 % — ABNORMAL LOW (ref 36.0–46.0)
Hemoglobin: 11.5 g/dL — ABNORMAL LOW (ref 12.0–15.0)
Immature Granulocytes: 12 %
Lymphocytes Relative: 9 %
Lymphs Abs: 2.2 10*3/uL (ref 0.7–4.0)
MCH: 29.9 pg (ref 26.0–34.0)
MCHC: 32.5 g/dL (ref 30.0–36.0)
MCV: 91.9 fL (ref 80.0–100.0)
Monocytes Absolute: 1.1 10*3/uL — ABNORMAL HIGH (ref 0.1–1.0)
Monocytes Relative: 4 %
Neutro Abs: 19.2 10*3/uL — ABNORMAL HIGH (ref 1.7–7.7)
Neutrophils Relative %: 75 %
Platelets: 218 10*3/uL (ref 150–400)
RBC: 3.85 MIL/uL — ABNORMAL LOW (ref 3.87–5.11)
RDW: 13.8 % (ref 11.5–15.5)
Smear Review: NORMAL
WBC: 25.5 10*3/uL — ABNORMAL HIGH (ref 4.0–10.5)
nRBC: 0.3 % — ABNORMAL HIGH (ref 0.0–0.2)

## 2020-10-17 LAB — BASIC METABOLIC PANEL
Anion gap: 7 (ref 5–15)
BUN: 14 mg/dL (ref 8–23)
CO2: 26 mmol/L (ref 22–32)
Calcium: 8.7 mg/dL — ABNORMAL LOW (ref 8.9–10.3)
Chloride: 103 mmol/L (ref 98–111)
Creatinine, Ser: 0.72 mg/dL (ref 0.44–1.00)
GFR, Estimated: >60 mL/min (ref >60)
Glucose, Bld: 102 mg/dL — ABNORMAL HIGH (ref 70–99)
Potassium: 3.9 mmol/L (ref 3.5–5.1)
Sodium: 136 mmol/L (ref 135–145)

## 2020-10-28 ENCOUNTER — Other Ambulatory Visit: Payer: Self-pay

## 2020-10-28 ENCOUNTER — Encounter: Payer: Self-pay | Admitting: Internal Medicine

## 2020-10-28 ENCOUNTER — Inpatient Hospital Stay: Payer: Medicare HMO

## 2020-10-28 ENCOUNTER — Inpatient Hospital Stay (HOSPITAL_BASED_OUTPATIENT_CLINIC_OR_DEPARTMENT_OTHER): Payer: Medicare HMO | Admitting: Internal Medicine

## 2020-10-28 DIAGNOSIS — C50412 Malignant neoplasm of upper-outer quadrant of left female breast: Secondary | ICD-10-CM

## 2020-10-28 DIAGNOSIS — Z5111 Encounter for antineoplastic chemotherapy: Secondary | ICD-10-CM | POA: Diagnosis not present

## 2020-10-28 DIAGNOSIS — Z170 Estrogen receptor positive status [ER+]: Secondary | ICD-10-CM

## 2020-10-28 LAB — COMPREHENSIVE METABOLIC PANEL
ALT: 14 U/L (ref 0–44)
AST: 15 U/L (ref 15–41)
Albumin: 4.1 g/dL (ref 3.5–5.0)
Alkaline Phosphatase: 70 U/L (ref 38–126)
Anion gap: 7 (ref 5–15)
BUN: 20 mg/dL (ref 8–23)
CO2: 24 mmol/L (ref 22–32)
Calcium: 9.3 mg/dL (ref 8.9–10.3)
Chloride: 106 mmol/L (ref 98–111)
Creatinine, Ser: 0.72 mg/dL (ref 0.44–1.00)
GFR, Estimated: >60 mL/min (ref >60)
Glucose, Bld: 110 mg/dL — ABNORMAL HIGH (ref 70–99)
Potassium: 4.3 mmol/L (ref 3.5–5.1)
Sodium: 137 mmol/L (ref 135–145)
Total Bilirubin: 0.6 mg/dL (ref 0.3–1.2)
Total Protein: 6.7 g/dL (ref 6.5–8.1)

## 2020-10-28 LAB — CBC WITH DIFFERENTIAL/PLATELET
Abs Immature Granulocytes: 0.04 10*3/uL (ref 0.00–0.07)
Basophils Absolute: 0 10*3/uL (ref 0.0–0.1)
Basophils Relative: 0 %
Eosinophils Absolute: 0 10*3/uL (ref 0.0–0.5)
Eosinophils Relative: 0 %
HCT: 35 % — ABNORMAL LOW (ref 36.0–46.0)
Hemoglobin: 11.4 g/dL — ABNORMAL LOW (ref 12.0–15.0)
Immature Granulocytes: 0 %
Lymphocytes Relative: 13 %
Lymphs Abs: 1.1 10*3/uL (ref 0.7–4.0)
MCH: 30.1 pg (ref 26.0–34.0)
MCHC: 32.6 g/dL (ref 30.0–36.0)
MCV: 92.3 fL (ref 80.0–100.0)
Monocytes Absolute: 0.7 10*3/uL (ref 0.1–1.0)
Monocytes Relative: 8 %
Neutro Abs: 7.1 10*3/uL (ref 1.7–7.7)
Neutrophils Relative %: 79 %
Platelets: 315 10*3/uL (ref 150–400)
RBC: 3.79 MIL/uL — ABNORMAL LOW (ref 3.87–5.11)
RDW: 14.2 % (ref 11.5–15.5)
WBC: 9 10*3/uL (ref 4.0–10.5)
nRBC: 0 % (ref 0.0–0.2)

## 2020-10-28 MED ORDER — PALONOSETRON HCL INJECTION 0.25 MG/5ML
INTRAVENOUS | Status: AC
  Filled 2020-10-28: qty 5

## 2020-10-28 MED ORDER — SODIUM CHLORIDE 0.9 % IV SOLN
10.0000 mg | Freq: Once | INTRAVENOUS | Status: AC
  Administered 2020-10-28: 10 mg via INTRAVENOUS
  Filled 2020-10-28: qty 10

## 2020-10-28 MED ORDER — PALONOSETRON HCL INJECTION 0.25 MG/5ML
0.2500 mg | Freq: Once | INTRAVENOUS | Status: AC
  Administered 2020-10-28: 0.25 mg via INTRAVENOUS

## 2020-10-28 MED ORDER — HEPARIN SOD (PORK) LOCK FLUSH 100 UNIT/ML IV SOLN
500.0000 [IU] | Freq: Once | INTRAVENOUS | Status: DC | PRN
Start: 2020-10-28 — End: 2020-10-28
  Filled 2020-10-28: qty 5

## 2020-10-28 MED ORDER — SODIUM CHLORIDE 0.9 % IV SOLN
559.0000 mg/m2 | Freq: Once | INTRAVENOUS | Status: AC
  Administered 2020-10-28: 1000 mg via INTRAVENOUS
  Filled 2020-10-28: qty 50

## 2020-10-28 MED ORDER — DOCETAXEL CHEMO INJECTION 160 MG/16ML
75.0000 mg/m2 | Freq: Once | INTRAVENOUS | Status: AC
  Administered 2020-10-28: 130 mg via INTRAVENOUS
  Filled 2020-10-28: qty 13

## 2020-10-28 MED ORDER — SODIUM CHLORIDE 0.9 % IV SOLN
Freq: Once | INTRAVENOUS | Status: AC
  Filled 2020-10-28: qty 250

## 2020-10-28 NOTE — Progress Notes (Signed)
[facility] NOTE  Patient Care Team: Sofie Hartigan, MD as PCP - General (Family Medicine) Theodore Demark, RN as Oncology Nurse Navigator Cammie Sickle, MD as Consulting Physician (Hematology and Oncology)  CHIEF COMPLAINTS/PURPOSE OF CONSULTATION: Breast cancer  #  Oncology History Overview Note  # JAN 2022-  0.5 cm irregular UPPER-OUTER LEFT breast mass with calcifications extending 1.5 cm anteroinferiorly, suspicious for malignancy. Ultrasound-guided biopsy of the mass is recommended. Stereotactic guided biopsy of the anterior/inferior most calcifications extending from this mass is also recommended. 2. Indeterminate 0.4 cm group of UPPER-OUTER LEFT breast calcifications, located 2.5 cm from the irregular mass above. 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 This was found to be concordant by Dr. Zerita Boers.   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 This was found to be concordant by Dr. Zerita Boers.   Pathology revealed COLUMNAR CELL CHANGE WITH ASSOCIATED CALCIFICATIONS, NEGATIVE FOR ATYPIA AND MALIGNANCY of the LEFT breast calcifications, medial upper outer quadrant, coil clip. This was found to be concordant by Dr. Zerita Boers.   Of note, the area of invasive mammary carcinoma spans at least 1.9 cm mammographically.  # BREAST, LEFT 2:00 7 CM FN; ULTRASOUND-GUIDED BIOPSY (VISION CLIP):  - INVASIVE MAMMARY CARCINOMA, NO SPECIAL TYPE.  Size of invasive carcinoma: 5 mm in this sample  Histologic grade  of invasive carcinoma: Grade 3                       Glandular/tubular differentiation score: 3                       Nuclear pleomorphism score: 3                       Mitotic rate score: 2                       Total score: 8  Ductal carcinoma in situ: Present, high-grade with calcification  Lymphovascular invasion: Not identified   B.  BREAST WITH CALCIFICATIONS, LEFT LATERAL UPPER OUTER QUADRANT;  STEREOTACTIC BIOPSY (X SHAPED CLIP):  - INVASIVE MAMMARY CARCINOMA, NO SPECIAL TYPE.  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  - CALCIFICATIONS ASSOCIATED WITH DCIS AND SCLEROSING ADENOSIS.  - AREAS SUSPICIOUS FOR LYMPHOVASCULAR INVASION.  Size of invasive carcinoma: 4 mm in this sample  Histologic grade of invasive carcinoma: Grade 3                       Glandular/tubular differentiation score: 3                       Nuclear pleomorphism score: 3                       Mitotic rate score: 2                       Total score: 8   C.  BREAST WITH CALCIFICATIONS, LEFT MEDIAL UPPER OUTER  QUADRANT;  STEREOTACTIC BIOPSY (COIL-SHAPED CLIP):  - COLUMNAR CELL CHANGE WITH ASSOCIATED CALCIFICATIONS.  - NEGATIVE FOR ATYPIA AND MALIGNANCY. BIOPSY- ER > 90%; PR-NEG; Her-2 NEG -----------------------------------------------------------------  TNM Descriptors: M (multifocal)  pT1a  Regional Lymph Nodes Modifier: SN  pN0  pM - Not applicable   #  HIGH RISK ONCOTYPE- RS score =35; which unfortunately translates to up to 23% risk of distant recurrence at 9 years.  Benefit from chemotherapy is more than 15%.  # JUne 2nd 2022- START TAX-CYTOXAN q3w x4.   Carcinoma of upper-outer quadrant of left breast in female, estrogen receptor positive ([place])  06/06/2020 Initial Diagnosis   Carcinoma of upper-outer quadrant of left breast in female, estrogen receptor positive ([place])   06/06/2020 Cancer Staging   Staging form: Breast, AJCC 8th Edition - Clinical: Stage IB  (cT1b, cN0, cM0, G3, ER+, PR-, HER2-) - Signed by Cammie Sickle, MD on 06/06/2020  Stage prefix: Initial diagnosis    09/16/2020 -  Chemotherapy    Patient is on Treatment Plan: BREAST TC Q21D          HISTORY OF PRESENTING ILLNESS:  [place] 65 y.o.  female newly diagnosed stage I breast cancer ER/PR positive HER2 negative status postlumpectomy -high risk Oncotype currently on adjuvant chemotherapy with Taxotere Cytoxan.   Patient admits to mild loose stools intermittently.  No significant diarrhea.  No nausea no vomiting.  No tingling or numbness.  No fevers or chills.   Review of Systems  Constitutional:  Negative for chills, diaphoresis, fever, malaise/fatigue and weight loss.  HENT:  Negative for nosebleeds and sore throat.   Eyes:  Negative for double vision.  Respiratory:  Negative for cough, hemoptysis, sputum production, shortness of breath and wheezing.   Cardiovascular:  Negative for chest pain, palpitations, orthopnea and leg swelling.  Gastrointestinal:  Positive for diarrhea. Negative for abdominal pain, blood in stool, constipation, heartburn, melena, nausea and vomiting.  Genitourinary:  Negative for dysuria, frequency and urgency.  Musculoskeletal:  Positive for joint pain and myalgias. Negative for back pain.  Skin: Negative.  Negative for itching and rash.  Neurological:  Negative for dizziness, tingling, focal weakness, weakness and headaches.  Endo/Heme/Allergies:  Does not bruise/bleed easily.  Psychiatric/Behavioral:  Negative for depression. The patient is not nervous/anxious and does not have insomnia.     MEDICAL HISTORY:  Past Medical History:  Diagnosis Date   Carcinoma of upper-outer quadrant of left breast in female, estrogen receptor positive ([place]) 06/06/2020   Diabetes mellitus without complication (HCC)    diet controlled   Family history of breast cancer    Family history of ovarian cancer    GERD (gastroesophageal reflux disease)     History of hiatal hernia    Hyperlipidemia    Hypertension    Migraines     SURGICAL HISTORY: Past Surgical History:  Procedure Laterality Date   BREAST BIOPSY Left 05/29/2020   affirm bx "X" clp-invasive   BREAST BIOPSY Left 05/29/2020   Affirm bx"Coil" clip-invasive   BREAST BIOPSY Right 2001   excisional biopsy   BREAST EXCISIONAL BIOPSY Right 06/19/1999   neg   BREAST LUMPECTOMY     BREAST LUMPECTOMY,RADIO FREQ LOCALIZER,AXILLARY SENTINEL LYMPH NODE BIOPSY Left 07/12/2020   Procedure: BREAST LUMPECTOMY,RADIO FREQ LOCALIZER,AXILLARY SENTINEL LYMPH NODE BIOPSY;  Surgeon: Fredirick Maudlin, MD;  Location: [facility] ORS;  Service: General;  Laterality: Left;   COLONOSCOPY  02/15/2009   EXCISION OF BREAST BIOPSY Left 07/12/2020   tag placement 07/10/20 vison tag  [id number]; hourglass tag [phone]; cylinder tag [phone]   TUBAL LIGATION      SOCIAL HISTORY: Social History   Socioeconomic History   Marital status: Married    Spouse name: Darrell   Number of children: Not on file   Years of education: Not on file   Highest education level: Not on file  Occupational History   Not on file  Tobacco Use   Smoking status: Former    Years: 20.00    Types: Cigarettes    Quit date: 2020    Years since quitting: 2.5   Smokeless tobacco: Never  Vaping Use   Vaping Use: Never used  Substance and Sexual Activity   Alcohol use: Never   Drug use: Never   Sexual activity: Not on file  Other Topics Concern   Not on file  Social History Narrative   Not on file   Social Determinants of Health   Financial Resource Strain: Not on file  Food Insecurity: Not on file  Transportation Needs: Not on file  Physical Activity: Not on file  Stress: Not on file  Social Connections: Not on file  Intimate Partner Violence: Not on file    FAMILY HISTORY: Family History  Problem Relation Age of Onset   Ovarian cancer Sister 42   Breast cancer Maternal Grandmother        dx 61s   Breast cancer Paternal  Grandmother     ALLERGIES:  has No Known Allergies.  MEDICATIONS:  Current Outpatient Medications  Medication Sig Dispense Refill   aspirin-acetaminophen-caffeine (EXCEDRIN MIGRAINE) 250-250-65 MG tablet Take 1 tablet by mouth daily as needed (headaches).     atorvastatin (LIPITOR) 10 MG tablet Take 10 mg by mouth daily.     Cholecalciferol 25 MCG (1000 UT) tablet Take 1,000 Units by mouth daily.     dexamethasone (DECADRON) 4 MG tablet Take one pill AM & PM for 2 days. TAKE day prior & day AFTER chemo. Do NOT take on day of chemo. 20 tablet 0   magnesium oxide (MAG-OX) 400 MG tablet Take 400 mg by mouth daily.     Omega-3 Fatty Acids (FISH OIL) 1000 MG CAPS Take 1,000 mg by mouth daily.     omeprazole (PRILOSEC) 20 MG capsule Take 20 mg by mouth daily.     ondansetron (ZOFRAN) 8 MG tablet One pill every 8 hours as needed for nausea/vomitting. 40 tablet 1   prochlorperazine (COMPAZINE) 10 MG tablet Take 1 tablet (10 mg total) by mouth every 6 (six) hours as needed for nausea or vomiting. 40 tablet 1   valsartan (DIOVAN) 80 MG tablet Take 80 mg by mouth daily.     No current facility-administered medications for this visit.      Marland Kitchen  PHYSICAL EXAMINATION: ECOG PERFORMANCE STATUS: 0 - Asymptomatic  Vitals:   10/28/20 0849  BP: (!) 144/80  Pulse: 66  Resp: 16  Temp: (!) 97.1 F (36.2 C)  SpO2: 100%   Filed Weights   10/28/20 0849  Weight: 156 lb 12.8 oz (71.1 kg)    Physical Exam HENT:     Head: Normocephalic and atraumatic.     Mouth/Throat:     Pharynx: No oropharyngeal exudate.  Eyes:     Pupils: Pupils are equal, round, and reactive to light.  Cardiovascular:     Rate and Rhythm: Normal rate and regular rhythm.  Pulmonary:     Effort: No respiratory distress.     Breath sounds: No wheezing.  Abdominal:  General: Bowel sounds are normal. There is no distension.     Palpations: Abdomen is soft. There is no mass.     Tenderness: no abdominal tenderness There is  no guarding or rebound.  Musculoskeletal:        General: No tenderness. Normal range of motion.     Cervical back: Normal range of motion and neck supple.  Skin:    General: Skin is warm.  Neurological:     Mental Status: She is alert and oriented to person, place, and time.  Psychiatric:        Mood and Affect: Affect normal.     LABORATORY DATA:  I have reviewed the data as listed Lab Results  Component Value Date   WBC 9.0 10/28/2020   HGB 11.4 (L) 10/28/2020   HCT 35.0 (L) 10/28/2020   MCV 92.3 10/28/2020   PLT 315 10/28/2020   Recent Labs    09/16/20 0813 09/26/20 1347 10/07/20 0819 10/17/20 1122 10/28/20 0817  NA 139   < > 138 136 137  K 4.1   < > 3.6 3.9 4.3  CL 105   < > 106 103 106  CO2 24   < > '26 26 24  ' GLUCOSE 164*   < > 124* 102* 110*  BUN 23   < > '18 14 20  ' CREATININE 0.82   < > 0.69 0.72 0.72  CALCIUM 9.6   < > 9.1 8.7* 9.3  GFRNONAA >60   < > >60 >60 >60  PROT 7.4  --  6.5  --  6.7  ALBUMIN 4.5  --  4.0  --  4.1  AST 20  --  16  --  15  ALT 19  --  20  --  14  ALKPHOS 71  --  70  --  70  BILITOT 0.5  --  0.5  --  0.6   < > = values in this interval not displayed.    RADIOGRAPHIC STUDIES: I have personally reviewed the radiological images as listed and agreed with the findings in the report. No results found.  ASSESSMENT & PLAN:   Carcinoma of upper-outer quadrant of left breast in female, estrogen receptor positive ([place]) #Stage I breast cancer- pT1a [5 mm; 4 mm] grade 3; pN0-postlumpectomy no residual malignancy noted.  ER positive PR negative; HER2 negative. HIGH RISK ONCOTYPE- RS score =35; which unfortunately translates to up to 23% risk of distant recurrence at 9 years.  Benefit from chemotherapy is more than 15%.  Currently on adjuvant Taxotere Cytoxan  # Proceed with Taxotere-Cytoxan- cycle #3.  Today.Labs today reviewed;  acceptable for treatment today.   # Myalgias/bone ache- sec to Taxotere/ udenyca.On Claritin;stable on prn  Tylenol.   # Diarrhea- G-1 monitor closely.   # DISPOSITION: # chemo today # labs in 10 days- cbc/bmp # follow up in 3weeks- MD; labs- cbc/cmp; Cytoxan-Taxoeter;D-2 udenyca Dr.B  All questions were answered. The patient/family knows to call the clinic with any problems, questions or concerns.   Cammie Sickle, MD 11/03/2020 8:42 PM

## 2020-10-28 NOTE — Assessment & Plan Note (Signed)
#  Stage I breast cancer- pT1a [5 mm; 4 mm] grade 3; pN0-postlumpectomy no residual malignancy noted.  ER positive PR negative; HER2 negative. HIGH RISK ONCOTYPE- RS score =35; which unfortunately translates to up to 23% risk of distant recurrence at 9 years.  Benefit from chemotherapy is more than 15%.  Currently on adjuvant Taxotere Cytoxan  # Proceed with Taxotere-Cytoxan- cycle #3.  Today.Labs today reviewed;  acceptable for treatment today.   # Myalgias/bone ache- sec to Taxotere/ udenyca.On Claritin;stable on prn Tylenol.   # Diarrhea- G-1 monitor closely.   # DISPOSITION: # chemo today # labs in 10 days- cbc/bmp # follow up in 3weeks- MD; labs- cbc/cmp; Cytoxan-Taxoeter;D-2 udenyca Dr.B

## 2020-10-29 ENCOUNTER — Inpatient Hospital Stay: Payer: Medicare HMO

## 2020-10-29 DIAGNOSIS — C50412 Malignant neoplasm of upper-outer quadrant of left female breast: Secondary | ICD-10-CM

## 2020-10-29 DIAGNOSIS — Z5111 Encounter for antineoplastic chemotherapy: Secondary | ICD-10-CM | POA: Diagnosis not present

## 2020-10-29 DIAGNOSIS — Z170 Estrogen receptor positive status [ER+]: Secondary | ICD-10-CM

## 2020-10-29 MED ORDER — PEGFILGRASTIM-CBQV 6 MG/0.6ML ~~LOC~~ SOSY
6.0000 mg | PREFILLED_SYRINGE | Freq: Once | SUBCUTANEOUS | Status: AC
  Administered 2020-10-29: 6 mg via SUBCUTANEOUS
  Filled 2020-10-29: qty 0.6

## 2020-11-03 ENCOUNTER — Encounter: Payer: Self-pay | Admitting: Internal Medicine

## 2020-11-06 ENCOUNTER — Other Ambulatory Visit: Payer: Self-pay | Admitting: *Deleted

## 2020-11-06 ENCOUNTER — Other Ambulatory Visit: Payer: Self-pay

## 2020-11-06 DIAGNOSIS — C50412 Malignant neoplasm of upper-outer quadrant of left female breast: Secondary | ICD-10-CM

## 2020-11-06 DIAGNOSIS — Z170 Estrogen receptor positive status [ER+]: Secondary | ICD-10-CM

## 2020-11-07 ENCOUNTER — Inpatient Hospital Stay: Payer: Medicare HMO

## 2020-11-07 ENCOUNTER — Other Ambulatory Visit: Payer: Self-pay

## 2020-11-07 DIAGNOSIS — Z170 Estrogen receptor positive status [ER+]: Secondary | ICD-10-CM

## 2020-11-07 DIAGNOSIS — Z5111 Encounter for antineoplastic chemotherapy: Secondary | ICD-10-CM | POA: Diagnosis not present

## 2020-11-07 DIAGNOSIS — C50412 Malignant neoplasm of upper-outer quadrant of left female breast: Secondary | ICD-10-CM

## 2020-11-07 LAB — BASIC METABOLIC PANEL
Anion gap: 7 (ref 5–15)
BUN: 14 mg/dL (ref 8–23)
CO2: 27 mmol/L (ref 22–32)
Calcium: 8.9 mg/dL (ref 8.9–10.3)
Chloride: 106 mmol/L (ref 98–111)
Creatinine, Ser: 0.8 mg/dL (ref 0.44–1.00)
GFR, Estimated: >60 mL/min (ref >60)
Glucose, Bld: 103 mg/dL — ABNORMAL HIGH (ref 70–99)
Potassium: 4 mmol/L (ref 3.5–5.1)
Sodium: 140 mmol/L (ref 135–145)

## 2020-11-07 LAB — CBC WITH DIFFERENTIAL/PLATELET
Abs Immature Granulocytes: 2.95 10*3/uL — ABNORMAL HIGH (ref 0.00–0.07)
Basophils Absolute: 0.1 10*3/uL (ref 0.0–0.1)
Basophils Relative: 0 %
Eosinophils Absolute: 0 10*3/uL (ref 0.0–0.5)
Eosinophils Relative: 0 %
HCT: 34.5 % — ABNORMAL LOW (ref 36.0–46.0)
Hemoglobin: 11.2 g/dL — ABNORMAL LOW (ref 12.0–15.0)
Immature Granulocytes: 11 %
Lymphocytes Relative: 9 %
Lymphs Abs: 2.3 10*3/uL (ref 0.7–4.0)
MCH: 30.3 pg (ref 26.0–34.0)
MCHC: 32.5 g/dL (ref 30.0–36.0)
MCV: 93.2 fL (ref 80.0–100.0)
Monocytes Absolute: 1.4 10*3/uL — ABNORMAL HIGH (ref 0.1–1.0)
Monocytes Relative: 5 %
Neutro Abs: 19.6 10*3/uL — ABNORMAL HIGH (ref 1.7–7.7)
Neutrophils Relative %: 75 %
Platelets: 234 10*3/uL (ref 150–400)
RBC: 3.7 MIL/uL — ABNORMAL LOW (ref 3.87–5.11)
RDW: 14.4 % (ref 11.5–15.5)
Smear Review: NORMAL
WBC: 26.3 10*3/uL — ABNORMAL HIGH (ref 4.0–10.5)
nRBC: 0.4 % — ABNORMAL HIGH (ref 0.0–0.2)

## 2020-11-14 ENCOUNTER — Other Ambulatory Visit: Payer: Self-pay | Admitting: *Deleted

## 2020-11-14 DIAGNOSIS — Z170 Estrogen receptor positive status [ER+]: Secondary | ICD-10-CM

## 2020-11-14 DIAGNOSIS — C50412 Malignant neoplasm of upper-outer quadrant of left female breast: Secondary | ICD-10-CM

## 2020-11-18 ENCOUNTER — Other Ambulatory Visit: Payer: Self-pay

## 2020-11-18 ENCOUNTER — Encounter: Payer: Self-pay | Admitting: Internal Medicine

## 2020-11-18 ENCOUNTER — Inpatient Hospital Stay: Payer: Medicare HMO | Attending: Internal Medicine

## 2020-11-18 ENCOUNTER — Inpatient Hospital Stay: Payer: Medicare HMO

## 2020-11-18 ENCOUNTER — Inpatient Hospital Stay: Payer: Medicare HMO | Admitting: Internal Medicine

## 2020-11-18 DIAGNOSIS — R197 Diarrhea, unspecified: Secondary | ICD-10-CM | POA: Insufficient documentation

## 2020-11-18 DIAGNOSIS — Z87891 Personal history of nicotine dependence: Secondary | ICD-10-CM | POA: Insufficient documentation

## 2020-11-18 DIAGNOSIS — C50412 Malignant neoplasm of upper-outer quadrant of left female breast: Secondary | ICD-10-CM | POA: Diagnosis present

## 2020-11-18 DIAGNOSIS — M2550 Pain in unspecified joint: Secondary | ICD-10-CM | POA: Diagnosis not present

## 2020-11-18 DIAGNOSIS — Z170 Estrogen receptor positive status [ER+]: Secondary | ICD-10-CM | POA: Diagnosis not present

## 2020-11-18 DIAGNOSIS — Z803 Family history of malignant neoplasm of breast: Secondary | ICD-10-CM | POA: Diagnosis not present

## 2020-11-18 DIAGNOSIS — Z79899 Other long term (current) drug therapy: Secondary | ICD-10-CM | POA: Insufficient documentation

## 2020-11-18 DIAGNOSIS — Z8041 Family history of malignant neoplasm of ovary: Secondary | ICD-10-CM | POA: Diagnosis not present

## 2020-11-18 DIAGNOSIS — M791 Myalgia, unspecified site: Secondary | ICD-10-CM | POA: Diagnosis not present

## 2020-11-18 LAB — CBC WITH DIFFERENTIAL/PLATELET
Abs Immature Granulocytes: 0.02 10*3/uL (ref 0.00–0.07)
Basophils Absolute: 0 10*3/uL (ref 0.0–0.1)
Basophils Relative: 0 %
Eosinophils Absolute: 0 10*3/uL (ref 0.0–0.5)
Eosinophils Relative: 0 %
HCT: 33.6 % — ABNORMAL LOW (ref 36.0–46.0)
Hemoglobin: 10.9 g/dL — ABNORMAL LOW (ref 12.0–15.0)
Immature Granulocytes: 0 %
Lymphocytes Relative: 23 %
Lymphs Abs: 1.8 10*3/uL (ref 0.7–4.0)
MCH: 30.2 pg (ref 26.0–34.0)
MCHC: 32.4 g/dL (ref 30.0–36.0)
MCV: 93.1 fL (ref 80.0–100.0)
Monocytes Absolute: 0.7 10*3/uL (ref 0.1–1.0)
Monocytes Relative: 9 %
Neutro Abs: 5.4 10*3/uL (ref 1.7–7.7)
Neutrophils Relative %: 68 %
Platelets: 314 10*3/uL (ref 150–400)
RBC: 3.61 MIL/uL — ABNORMAL LOW (ref 3.87–5.11)
RDW: 14.6 % (ref 11.5–15.5)
WBC: 8 10*3/uL (ref 4.0–10.5)
nRBC: 0 % (ref 0.0–0.2)

## 2020-11-18 LAB — COMPREHENSIVE METABOLIC PANEL
ALT: 12 U/L (ref 0–44)
AST: 14 U/L — ABNORMAL LOW (ref 15–41)
Albumin: 4.1 g/dL (ref 3.5–5.0)
Alkaline Phosphatase: 66 U/L (ref 38–126)
Anion gap: 6 (ref 5–15)
BUN: 19 mg/dL (ref 8–23)
CO2: 26 mmol/L (ref 22–32)
Calcium: 9 mg/dL (ref 8.9–10.3)
Chloride: 103 mmol/L (ref 98–111)
Creatinine, Ser: 0.66 mg/dL (ref 0.44–1.00)
GFR, Estimated: >60 mL/min (ref >60)
Glucose, Bld: 99 mg/dL (ref 70–99)
Potassium: 3.7 mmol/L (ref 3.5–5.1)
Sodium: 135 mmol/L (ref 135–145)
Total Bilirubin: 0.7 mg/dL (ref 0.3–1.2)
Total Protein: 6.7 g/dL (ref 6.5–8.1)

## 2020-11-18 MED ORDER — SODIUM CHLORIDE 0.9 % IV SOLN
75.0000 mg/m2 | Freq: Once | INTRAVENOUS | Status: AC
  Administered 2020-11-18: 130 mg via INTRAVENOUS
  Filled 2020-11-18: qty 13

## 2020-11-18 MED ORDER — SODIUM CHLORIDE 0.9 % IV SOLN
10.0000 mg | Freq: Once | INTRAVENOUS | Status: AC
  Administered 2020-11-18: 10 mg via INTRAVENOUS
  Filled 2020-11-18: qty 10

## 2020-11-18 MED ORDER — SODIUM CHLORIDE 0.9 % IV SOLN
559.0000 mg/m2 | Freq: Once | INTRAVENOUS | Status: AC
  Administered 2020-11-18: 1000 mg via INTRAVENOUS
  Filled 2020-11-18: qty 50

## 2020-11-18 MED ORDER — SODIUM CHLORIDE 0.9 % IV SOLN
Freq: Once | INTRAVENOUS | Status: AC
  Filled 2020-11-18: qty 250

## 2020-11-18 MED ORDER — PALONOSETRON HCL INJECTION 0.25 MG/5ML
0.2500 mg | Freq: Once | INTRAVENOUS | Status: AC
  Administered 2020-11-18: 0.25 mg via INTRAVENOUS
  Filled 2020-11-18: qty 5

## 2020-11-18 NOTE — Progress Notes (Signed)
[facility] NOTE  Patient Care Team: Sofie Hartigan, MD as PCP - General (Family Medicine) Theodore Demark, RN as Oncology Nurse Navigator Cammie Sickle, MD as Consulting Physician (Hematology and Oncology)  CHIEF COMPLAINTS/PURPOSE OF CONSULTATION: Breast cancer  #  Oncology History Overview Note  # JAN 2022-  0.5 cm irregular UPPER-OUTER LEFT breast mass with calcifications extending 1.5 cm anteroinferiorly, suspicious for malignancy. Ultrasound-guided biopsy of the mass is recommended. Stereotactic guided biopsy of the anterior/inferior most calcifications extending from this mass is also recommended. 2. Indeterminate 0.4 cm group of UPPER-OUTER LEFT breast calcifications, located 2.5 cm from the irregular mass above. 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 This was found to be concordant by Dr. Zerita Boers.   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 This was found to be concordant by Dr. Zerita Boers.   Pathology revealed COLUMNAR CELL CHANGE WITH ASSOCIATED CALCIFICATIONS, NEGATIVE FOR ATYPIA AND MALIGNANCY of the LEFT breast calcifications, medial upper outer quadrant, coil clip. This was found to be concordant by Dr. Zerita Boers.   Of note, the area of invasive mammary carcinoma spans at least 1.9 cm mammographically.  # BREAST, LEFT 2:00 7 CM FN; ULTRASOUND-GUIDED BIOPSY (VISION CLIP):  - INVASIVE MAMMARY CARCINOMA, NO SPECIAL TYPE.  Size of invasive carcinoma: 5 mm in this sample  Histologic grade  of invasive carcinoma: Grade 3                       Glandular/tubular differentiation score: 3                       Nuclear pleomorphism score: 3                       Mitotic rate score: 2                       Total score: 8  Ductal carcinoma in situ: Present, high-grade with calcification  Lymphovascular invasion: Not identified   B.  BREAST WITH CALCIFICATIONS, LEFT LATERAL UPPER OUTER QUADRANT;  STEREOTACTIC BIOPSY (X SHAPED CLIP):  - INVASIVE MAMMARY CARCINOMA, NO SPECIAL TYPE.  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  - CALCIFICATIONS ASSOCIATED WITH DCIS AND SCLEROSING ADENOSIS.  - AREAS SUSPICIOUS FOR LYMPHOVASCULAR INVASION.  Size of invasive carcinoma: 4 mm in this sample  Histologic grade of invasive carcinoma: Grade 3                       Glandular/tubular differentiation score: 3                       Nuclear pleomorphism score: 3                       Mitotic rate score: 2                       Total score: 8   C.  BREAST WITH CALCIFICATIONS, LEFT MEDIAL UPPER OUTER  QUADRANT;  STEREOTACTIC BIOPSY (COIL-SHAPED CLIP):  - COLUMNAR CELL CHANGE WITH ASSOCIATED CALCIFICATIONS.  - NEGATIVE FOR ATYPIA AND MALIGNANCY. BIOPSY- ER > 90%; PR-NEG; Her-2 NEG -----------------------------------------------------------------  TNM Descriptors: M (multifocal)  pT1a  Regional Lymph Nodes Modifier: SN  pN0  pM - Not applicable   #  HIGH RISK ONCOTYPE- RS score =35; which unfortunately translates to up to 23% risk of distant recurrence at 9 years.  Benefit from chemotherapy is more than 15%.  # JUne 2nd 2022- START TAX-CYTOXAN q3w x4.   Carcinoma of upper-outer quadrant of left breast in female, estrogen receptor positive ([place])  06/06/2020 Initial Diagnosis   Carcinoma of upper-outer quadrant of left breast in female, estrogen receptor positive ([place])   06/06/2020 Cancer Staging   Staging form: Breast, AJCC 8th Edition - Clinical: Stage IB  (cT1b, cN0, cM0, G3, ER+, PR-, HER2-) - Signed by Cammie Sickle, MD on 06/06/2020  Stage prefix: Initial diagnosis    09/16/2020 -  Chemotherapy    Patient is on Treatment Plan: BREAST TC Q21D          HISTORY OF PRESENTING ILLNESS:  [place] 65 y.o.  female newly diagnosed stage I breast cancer ER/PR positive HER2 negative status postlumpectomy -high risk Oncotype currently on adjuvant chemotherapy with Taxotere Cytoxan.  Denies any nausea vomiting.  Appetite is is good.  No significant tingling or numbness.  Review of Systems  Constitutional:  Negative for chills, diaphoresis, fever, malaise/fatigue and weight loss.  HENT:  Negative for nosebleeds and sore throat.   Eyes:  Negative for double vision.  Respiratory:  Negative for cough, hemoptysis, sputum production, shortness of breath and wheezing.   Cardiovascular:  Negative for chest pain, palpitations, orthopnea and leg swelling.  Gastrointestinal:  Positive for diarrhea. Negative for abdominal pain, blood in stool, constipation, heartburn, melena, nausea and vomiting.  Genitourinary:  Negative for dysuria, frequency and urgency.  Musculoskeletal:  Positive for joint pain and myalgias. Negative for back pain.  Skin: Negative.  Negative for itching and rash.  Neurological:  Negative for dizziness, tingling, focal weakness, weakness and headaches.  Endo/Heme/Allergies:  Does not bruise/bleed easily.  Psychiatric/Behavioral:  Negative for depression. The patient is not nervous/anxious and does not have insomnia.     MEDICAL HISTORY:  Past Medical History:  Diagnosis Date   Carcinoma of upper-outer quadrant of left breast in female, estrogen receptor positive ([place]) 06/06/2020   Diabetes mellitus without complication (HCC)    diet controlled   Family history of breast cancer    Family history of ovarian cancer    GERD (gastroesophageal reflux disease)    History of hiatal hernia    Hyperlipidemia    Hypertension     Migraines     SURGICAL HISTORY: Past Surgical History:  Procedure Laterality Date   BREAST BIOPSY Left 05/29/2020   affirm bx "X" clp-invasive   BREAST BIOPSY Left 05/29/2020   Affirm bx"Coil" clip-invasive   BREAST BIOPSY Right 2001   excisional biopsy   BREAST EXCISIONAL BIOPSY Right 06/19/1999   neg   BREAST LUMPECTOMY     BREAST LUMPECTOMY,RADIO FREQ LOCALIZER,AXILLARY SENTINEL LYMPH NODE BIOPSY Left 07/12/2020   Procedure: BREAST LUMPECTOMY,RADIO FREQ LOCALIZER,AXILLARY SENTINEL LYMPH NODE BIOPSY;  Surgeon: Fredirick Maudlin, MD;  Location: [facility] ORS;  Service: General;  Laterality: Left;   COLONOSCOPY  02/15/2009   EXCISION OF BREAST BIOPSY Left 07/12/2020   tag placement 07/10/20 vison tag [id number]; hourglass tag [id number]; cylinder tag [id number]   TUBAL LIGATION  SOCIAL HISTORY: Social History   Socioeconomic History   Marital status: Married    Spouse name: Darrell   Number of children: Not on file   Years of education: Not on file   Highest education level: Not on file  Occupational History   Not on file  Tobacco Use   Smoking status: Former    Years: 20.00    Types: Cigarettes    Quit date: 2020    Years since quitting: 2.6   Smokeless tobacco: Never  Vaping Use   Vaping Use: Never used  Substance and Sexual Activity   Alcohol use: Never   Drug use: Never   Sexual activity: Not on file  Other Topics Concern   Not on file  Social History Narrative   Not on file   Social Determinants of Health   Financial Resource Strain: Not on file  Food Insecurity: Not on file  Transportation Needs: Not on file  Physical Activity: Not on file  Stress: Not on file  Social Connections: Not on file  Intimate Partner Violence: Not on file    FAMILY HISTORY: Family History  Problem Relation Age of Onset   Ovarian cancer Sister 8   Breast cancer Maternal Grandmother        dx 51s   Breast cancer Paternal Grandmother     ALLERGIES:  has No Known  Allergies.  MEDICATIONS:  Current Outpatient Medications  Medication Sig Dispense Refill   aspirin-acetaminophen-caffeine (EXCEDRIN MIGRAINE) 250-250-65 MG tablet Take 1 tablet by mouth daily as needed (headaches).     atorvastatin (LIPITOR) 10 MG tablet Take 10 mg by mouth daily.     Cholecalciferol 25 MCG (1000 UT) tablet Take 1,000 Units by mouth daily.     dexamethasone (DECADRON) 4 MG tablet Take one pill AM & PM for 2 days. TAKE day prior & day AFTER chemo. Do NOT take on day of chemo. 20 tablet 0   magnesium oxide (MAG-OX) 400 MG tablet Take 400 mg by mouth daily.     Omega-3 Fatty Acids (FISH OIL) 1000 MG CAPS Take 1,000 mg by mouth daily.     omeprazole (PRILOSEC) 20 MG capsule Take 20 mg by mouth daily.     ondansetron (ZOFRAN) 8 MG tablet One pill every 8 hours as needed for nausea/vomitting. 40 tablet 1   prochlorperazine (COMPAZINE) 10 MG tablet Take 1 tablet (10 mg total) by mouth every 6 (six) hours as needed for nausea or vomiting. 40 tablet 1   valsartan (DIOVAN) 80 MG tablet Take 80 mg by mouth daily.     No current facility-administered medications for this visit.      Marland Kitchen  PHYSICAL EXAMINATION: ECOG PERFORMANCE STATUS: 0 - Asymptomatic  Vitals:   11/18/20 0945  BP: 122/80  Pulse: 60  Resp: 18  Temp: (!) 97.2 F (36.2 C)   Filed Weights   11/18/20 0945  Weight: 154 lb (69.9 kg)    Physical Exam HENT:     Head: Normocephalic and atraumatic.     Mouth/Throat:     Pharynx: No oropharyngeal exudate.  Eyes:     Pupils: Pupils are equal, round, and reactive to light.  Cardiovascular:     Rate and Rhythm: Normal rate and regular rhythm.  Pulmonary:     Effort: No respiratory distress.     Breath sounds: No wheezing.  Abdominal:     General: Bowel sounds are normal. There is no distension.     Palpations: Abdomen is soft. There  is no mass.     Tenderness: no abdominal tenderness There is no guarding or rebound.  Musculoskeletal:        General: No  tenderness. Normal range of motion.     Cervical back: Normal range of motion and neck supple.  Skin:    General: Skin is warm.  Neurological:     Mental Status: She is alert and oriented to person, place, and time.  Psychiatric:        Mood and Affect: Affect normal.     LABORATORY DATA:  I have reviewed the data as listed Lab Results  Component Value Date   WBC 8.0 11/18/2020   HGB 10.9 (L) 11/18/2020   HCT 33.6 (L) 11/18/2020   MCV 93.1 11/18/2020   PLT 314 11/18/2020   Recent Labs    10/07/20 0819 10/17/20 1122 10/28/20 0817 11/07/20 1319 11/18/20 0858  NA 138   < > 137 140 135  K 3.6   < > 4.3 4.0 3.7  CL 106   < > 106 106 103  CO2 26   < > '24 27 26  ' GLUCOSE 124*   < > 110* 103* 99  BUN 18   < > '20 14 19  ' CREATININE 0.69   < > 0.72 0.80 0.66  CALCIUM 9.1   < > 9.3 8.9 9.0  GFRNONAA >60   < > >60 >60 >60  PROT 6.5  --  6.7  --  6.7  ALBUMIN 4.0  --  4.1  --  4.1  AST 16  --  15  --  14*  ALT 20  --  14  --  12  ALKPHOS 70  --  70  --  66  BILITOT 0.5  --  0.6  --  0.7   < > = values in this interval not displayed.    RADIOGRAPHIC STUDIES: I have personally reviewed the radiological images as listed and agreed with the findings in the report. No results found.  ASSESSMENT & PLAN:   Carcinoma of upper-outer quadrant of left breast in female, estrogen receptor positive ([place]) #Stage I breast cancer- pT1a [5 mm; 4 mm] grade 3; pN0-postlumpectomy no residual malignancy noted.  ER positive PR negative; HER2 negative. HIGH RISK ONCOTYPE- RS score =35..  Currently on adjuvant Taxotere Cytoxan  # Proceed with Taxotere-Cytoxan- cycle #4.  Today.Labs today reviewed;  acceptable for treatment today.  We will make referral to radiation oncology.  Patient will be candidate for AI post radiation.  # Myalgias/bone ache- sec to Taxotere/ udenyca.On Claritin;stable on prn Tylenol- STABLE   # Diarrhea- G-1 monitor closely- STABLE.   # DISPOSITION: # referral to radiation  Oncology # chemo today; D-2 Ellen Henri # labs in 10 days- cbc/bmp # follow up in 4weeks- MD; labs- cbc/cmp-No chemo Dr.B  All questions were answered. The patient/family knows to call the clinic with any problems, questions or concerns.   Cammie Sickle, MD 11/18/2020 10:41 PM

## 2020-11-18 NOTE — Assessment & Plan Note (Addendum)
#  Stage I breast cancer- pT1a [5 mm; 4 mm] grade 3; pN0-postlumpectomy no residual malignancy noted.  ER positive PR negative; HER2 negative. HIGH RISK ONCOTYPE- RS score =35..  Currently on adjuvant Taxotere Cytoxan  # Proceed with Taxotere-Cytoxan- cycle #4.  Today.Labs today reviewed;  acceptable for treatment today.  We will make referral to radiation oncology.  Patient will be candidate for AI post radiation.  # Myalgias/bone ache- sec to Taxotere/ udenyca.On Claritin;stable on prn Tylenol- STABLE   # Diarrhea- G-1 monitor closely- STABLE.   # DISPOSITION: # referral to radiation Oncology # chemo today; D-2 Ellen Henri # labs in 10 days- cbc/bmp # follow up in 4weeks- MD; labs- cbc/cmp-No chemo Dr.B

## 2020-11-18 NOTE — Patient Instructions (Signed)
[facility]  Discharge Instructions: Thank you for choosing [facility] to provide your oncology and hematology care.  If you have a lab appointment with the [facility], please go directly to the [facility] and check in at the registration area.  Wear comfortable clothing and clothing appropriate for easy access to any Portacath or PICC line.   We strive to give you quality time with your provider. You may need to reschedule your appointment if you arrive late (15 or more minutes).  Arriving late affects you and other patients whose appointments are after yours.  Also, if you miss three or more appointments without notifying the office, you may be dismissed from the clinic at the provider's discretion.      For prescription refill requests, have your pharmacy contact our office and allow 72 hours for refills to be completed.    Today you received the following chemotherapy and/or immunotherapy agents Taxotere & Cytoxan      To help prevent nausea and vomiting after your treatment, we encourage you to take your nausea medication as directed.  BELOW ARE SYMPTOMS THAT SHOULD BE REPORTED IMMEDIATELY: *FEVER GREATER THAN 100.4 F (38 C) OR HIGHER *CHILLS OR SWEATING *NAUSEA AND VOMITING THAT IS NOT CONTROLLED WITH YOUR NAUSEA MEDICATION *UNUSUAL SHORTNESS OF BREATH *UNUSUAL BRUISING OR BLEEDING *URINARY PROBLEMS (pain or burning when urinating, or frequent urination) *BOWEL PROBLEMS (unusual diarrhea, constipation, pain near the anus) TENDERNESS IN MOUTH AND THROAT WITH OR WITHOUT PRESENCE OF ULCERS (sore throat, sores in mouth, or a toothache) UNUSUAL RASH, SWELLING OR PAIN  UNUSUAL VAGINAL DISCHARGE OR ITCHING   Items with * indicate a potential emergency and should be followed up as soon as possible or go to the Emergency Department if any problems should occur.  Please show the CHEMOTHERAPY ALERT CARD or IMMUNOTHERAPY ALERT CARD at  check-in to the Emergency Department and triage nurse.  Should you have questions after your visit or need to cancel or reschedule your appointment, please contact [facility]  [phone] and follow the prompts.  Office hours are 8:00 a.m. to 4:30 p.m. Monday - Friday. Please note that voicemails left after 4:00 p.m. may not be returned until the following business day.  We are closed weekends and major holidays. You have access to a nurse at all times for urgent questions. Please call the main number to the clinic [phone] and follow the prompts.  For any non-urgent questions, you may also contact your provider using MyChart. We now offer e-Visits for anyone 65 and older to request care online for non-urgent symptoms. For details visit [email or web address].   Also download the MyChart app! Go to the app store, search "MyChart", open the app, select [facility], and log in with your MyChart username and password.  Due to Covid, a mask is required upon entering the hospital/clinic. If you do not have a mask, one will be given to you upon arrival. For doctor visits, patients may have 1 support person aged 22 or older with them. For treatment visits, patients cannot have anyone with them due to current Covid guidelines and our immunocompromised population.

## 2020-11-19 ENCOUNTER — Inpatient Hospital Stay: Payer: Medicare HMO

## 2020-11-19 DIAGNOSIS — C50412 Malignant neoplasm of upper-outer quadrant of left female breast: Secondary | ICD-10-CM | POA: Diagnosis not present

## 2020-11-19 DIAGNOSIS — Z170 Estrogen receptor positive status [ER+]: Secondary | ICD-10-CM

## 2020-11-19 MED ORDER — PEGFILGRASTIM-CBQV 6 MG/0.6ML ~~LOC~~ SOSY
6.0000 mg | PREFILLED_SYRINGE | Freq: Once | SUBCUTANEOUS | Status: AC
  Administered 2020-11-19: 6 mg via SUBCUTANEOUS
  Filled 2020-11-19: qty 0.6

## 2020-11-28 ENCOUNTER — Ambulatory Visit
Admission: RE | Admit: 2020-11-28 | Discharge: 2020-11-28 | Disposition: A | Discharge status: Home / Self Care | Payer: Medicare HMO | Source: Ambulatory Visit | Attending: Radiation Oncology | Admitting: Radiation Oncology

## 2020-11-28 ENCOUNTER — Encounter: Payer: Self-pay | Admitting: Radiation Oncology

## 2020-11-28 ENCOUNTER — Inpatient Hospital Stay: Payer: Medicare HMO

## 2020-11-28 VITALS — BP 137/80 | HR 76 | Temp 96.0°F | Resp 18 | Wt 152.6 lb

## 2020-11-28 DIAGNOSIS — Z170 Estrogen receptor positive status [ER+]: Secondary | ICD-10-CM

## 2020-11-28 DIAGNOSIS — G629 Polyneuropathy, unspecified: Secondary | ICD-10-CM | POA: Insufficient documentation

## 2020-11-28 DIAGNOSIS — C50412 Malignant neoplasm of upper-outer quadrant of left female breast: Secondary | ICD-10-CM | POA: Diagnosis present

## 2020-11-28 LAB — CBC WITH DIFFERENTIAL/PLATELET
Abs Immature Granulocytes: 1.84 10*3/uL — ABNORMAL HIGH (ref 0.00–0.07)
Basophils Absolute: 0.1 10*3/uL (ref 0.0–0.1)
Basophils Relative: 0 %
Eosinophils Absolute: 0 10*3/uL (ref 0.0–0.5)
Eosinophils Relative: 0 %
HCT: 35.2 % — ABNORMAL LOW (ref 36.0–46.0)
Hemoglobin: 11.5 g/dL — ABNORMAL LOW (ref 12.0–15.0)
Immature Granulocytes: 9 %
Lymphocytes Relative: 11 %
Lymphs Abs: 2.1 10*3/uL (ref 0.7–4.0)
MCH: 30.6 pg (ref 26.0–34.0)
MCHC: 32.7 g/dL (ref 30.0–36.0)
MCV: 93.6 fL (ref 80.0–100.0)
Monocytes Absolute: 1.2 10*3/uL — ABNORMAL HIGH (ref 0.1–1.0)
Monocytes Relative: 6 %
Neutro Abs: 14.3 10*3/uL — ABNORMAL HIGH (ref 1.7–7.7)
Neutrophils Relative %: 74 %
Platelets: 246 10*3/uL (ref 150–400)
RBC: 3.76 MIL/uL — ABNORMAL LOW (ref 3.87–5.11)
RDW: 15 % (ref 11.5–15.5)
Smear Review: NORMAL
WBC: 19.5 10*3/uL — ABNORMAL HIGH (ref 4.0–10.5)
nRBC: 0.5 % — ABNORMAL HIGH (ref 0.0–0.2)

## 2020-11-28 LAB — BASIC METABOLIC PANEL
Anion gap: 7 (ref 5–15)
BUN: 12 mg/dL (ref 8–23)
CO2: 27 mmol/L (ref 22–32)
Calcium: 8.8 mg/dL — ABNORMAL LOW (ref 8.9–10.3)
Chloride: 103 mmol/L (ref 98–111)
Creatinine, Ser: 0.86 mg/dL (ref 0.44–1.00)
GFR, Estimated: >60 mL/min (ref >60)
Glucose, Bld: 108 mg/dL — ABNORMAL HIGH (ref 70–99)
Potassium: 4 mmol/L (ref 3.5–5.1)
Sodium: 137 mmol/L (ref 135–145)

## 2020-11-28 NOTE — Progress Notes (Signed)
Radiation Oncology Follow up Note  Name: Candice Bowers   Date:   11/28/2020 MRN:  [id number] DOB: 11/09/1955    This 65 y.o. female presents to the clinic today for reevaluation after completion of chemotherapy for stage I ER positive invasive mammary carcinoma the left breast.  Her initial staging was stage Ib (T1b N0 M0) grade 3 ER positive PR negative HER2/neu negative  REFERRING PROVIDER: Sofie Hartigan, MD  HPI: Patient is a 65 year old female originally consulted back in May 2022 when she presented with a stage I ER.  Positive invasive mammary carcinoma of the left breast.  Her pathology was interesting in that she had multiple areas of abnormality 1 area showing ductal carcinoma in situ with margin close to 0.1 cm.  She also had a initial biopsy which was positive for grade 3 invasive mammary carcinoma with high-grade ductal carcinoma in situ with calcifications.  The area of 0.4 cm group of upper outer left breast calcifications was the area of grade 3 invasive mammary carcinoma.  She had 2 sentinel lymph nodes which were negative.  Based on Oncotype DX showing high risk of recurrence and a 15% benefit of chemotherapy she has undergone 4 cycles of Taxotere Cytoxan.  She is developed some peripheral neuropathy also some bone pain mostly in her back.  She is completed chemotherapy and is now referred to radiation oncology for opinion.  From a breast standpoint she is doing well specifically denies breast tenderness cough.  COMPLICATIONS OF TREATMENT: none  FOLLOW UP COMPLIANCE: keeps appointments   PHYSICAL EXAM:  BP 137/80 (BP Location: Right Arm, Patient Position: Sitting)   Pulse 76   Temp (!) 96 F (35.6 C) (Tympanic)   Resp 18   Wt 152 lb 9.6 oz (69.2 kg)   SpO2 (!) 76%   BMI 26.19 kg/m patient is wheelchair-bound appears somewhat frail Lungs are clear to A&P cardiac examination essentially unremarkable with regular rate and rhythm. No dominant mass or nodularity is noted in  either breast in 2 positions examined. Incision is well-healed. No axillary or supraclavicular adenopathy is appreciated. Cosmetic result is excellent.  Well-developed well-nourished patient in NAD. HEENT reveals PERLA, EOMI, discs not visualized.  Oral cavity is clear. No oral mucosal lesions are identified. Neck is clear without evidence of cervical or supraclavicular adenopathy. Lungs are clear to A&P. Cardiac examination is essentially unremarkable with regular rate and rhythm without murmur rub or thrill. Abdomen is benign with no organomegaly or masses noted. Motor sensory and DTR levels are equal and symmetric in the upper and lower extremities. Cranial nerves II through XII are grossly intact. Proprioception is intact. No peripheral adenopathy or edema is identified. No motor or sensory levels are noted. Crude visual fields are within normal range.  RADIOLOGY RESULTS: Breast MRIs mammograms ultrasound all reviewed compatible with above-stated findings  PLAN: At this time elect to go with whole breast hypofractionated course of treatment over 3 weeks.  Would also boost her scar another 1000 cGy using electron beam.  Risks and benefits of treatment including skin reaction fatigue alteration blood counts possible occlusion of superficial lung all were described in detail to the patient.  She seems to comprehend my treatment plan well.  I will give her another week or 2 to recover from chemotherapy and have set up CT simulation towards the end of August.  Patient seems to comprehend my recommendations well.  I would like to take this opportunity to thank you for allowing me to participate in  the care of your patient.Noreene Filbert, MD

## 2020-12-02 ENCOUNTER — Ambulatory Visit
Admission: RE | Admit: 2020-12-02 | Discharge: 2020-12-02 | Disposition: A | Discharge status: Home / Self Care | Payer: Medicare HMO | Source: Ambulatory Visit | Attending: Radiation Oncology | Admitting: Radiation Oncology

## 2020-12-02 DIAGNOSIS — C50412 Malignant neoplasm of upper-outer quadrant of left female breast: Secondary | ICD-10-CM | POA: Insufficient documentation

## 2020-12-02 DIAGNOSIS — Z510 Encounter for antineoplastic radiation therapy: Secondary | ICD-10-CM | POA: Insufficient documentation

## 2020-12-05 ENCOUNTER — Other Ambulatory Visit: Payer: Self-pay | Admitting: *Deleted

## 2020-12-05 DIAGNOSIS — C50412 Malignant neoplasm of upper-outer quadrant of left female breast: Secondary | ICD-10-CM

## 2020-12-05 DIAGNOSIS — Z170 Estrogen receptor positive status [ER+]: Secondary | ICD-10-CM

## 2020-12-06 DIAGNOSIS — Z510 Encounter for antineoplastic radiation therapy: Secondary | ICD-10-CM | POA: Diagnosis not present

## 2020-12-09 ENCOUNTER — Ambulatory Visit: Admission: RE | Admit: 2020-12-09 | Payer: Medicare HMO | Source: Ambulatory Visit

## 2020-12-09 DIAGNOSIS — Z510 Encounter for antineoplastic radiation therapy: Secondary | ICD-10-CM | POA: Diagnosis not present

## 2020-12-10 ENCOUNTER — Ambulatory Visit
Admission: RE | Admit: 2020-12-10 | Discharge: 2020-12-10 | Disposition: A | Discharge status: Home / Self Care | Payer: Medicare HMO | Source: Ambulatory Visit | Attending: Radiation Oncology | Admitting: Radiation Oncology

## 2020-12-10 DIAGNOSIS — Z510 Encounter for antineoplastic radiation therapy: Secondary | ICD-10-CM | POA: Diagnosis not present

## 2020-12-11 ENCOUNTER — Ambulatory Visit
Admission: RE | Admit: 2020-12-11 | Discharge: 2020-12-11 | Disposition: A | Discharge status: Home / Self Care | Payer: Medicare HMO | Source: Ambulatory Visit | Attending: Radiation Oncology | Admitting: Radiation Oncology

## 2020-12-11 DIAGNOSIS — Z510 Encounter for antineoplastic radiation therapy: Secondary | ICD-10-CM | POA: Diagnosis not present

## 2020-12-12 ENCOUNTER — Ambulatory Visit
Admission: RE | Admit: 2020-12-12 | Discharge: 2020-12-12 | Disposition: A | Discharge status: Home / Self Care | Payer: Medicare HMO | Source: Ambulatory Visit | Attending: Radiation Oncology | Admitting: Radiation Oncology

## 2020-12-12 DIAGNOSIS — Z510 Encounter for antineoplastic radiation therapy: Secondary | ICD-10-CM | POA: Insufficient documentation

## 2020-12-12 DIAGNOSIS — C50412 Malignant neoplasm of upper-outer quadrant of left female breast: Secondary | ICD-10-CM | POA: Insufficient documentation

## 2020-12-13 ENCOUNTER — Ambulatory Visit
Admission: RE | Admit: 2020-12-13 | Discharge: 2020-12-13 | Disposition: A | Discharge status: Home / Self Care | Payer: Medicare HMO | Source: Ambulatory Visit | Attending: Radiation Oncology | Admitting: Radiation Oncology

## 2020-12-13 DIAGNOSIS — Z510 Encounter for antineoplastic radiation therapy: Secondary | ICD-10-CM | POA: Diagnosis not present

## 2020-12-17 ENCOUNTER — Ambulatory Visit
Admission: RE | Admit: 2020-12-17 | Discharge: 2020-12-17 | Disposition: A | Discharge status: Home / Self Care | Payer: Medicare HMO | Source: Ambulatory Visit | Attending: Radiation Oncology | Admitting: Radiation Oncology

## 2020-12-17 DIAGNOSIS — Z510 Encounter for antineoplastic radiation therapy: Secondary | ICD-10-CM | POA: Diagnosis not present

## 2020-12-18 ENCOUNTER — Other Ambulatory Visit: Payer: Self-pay

## 2020-12-18 ENCOUNTER — Inpatient Hospital Stay: Payer: Medicare HMO | Admitting: Internal Medicine

## 2020-12-18 ENCOUNTER — Inpatient Hospital Stay: Payer: Medicare HMO | Attending: Internal Medicine

## 2020-12-18 ENCOUNTER — Ambulatory Visit
Admission: RE | Admit: 2020-12-18 | Discharge: 2020-12-18 | Disposition: A | Discharge status: Home / Self Care | Payer: Medicare HMO | Source: Ambulatory Visit | Attending: Radiation Oncology | Admitting: Radiation Oncology

## 2020-12-18 DIAGNOSIS — Z87891 Personal history of nicotine dependence: Secondary | ICD-10-CM | POA: Insufficient documentation

## 2020-12-18 DIAGNOSIS — Z170 Estrogen receptor positive status [ER+]: Secondary | ICD-10-CM | POA: Insufficient documentation

## 2020-12-18 DIAGNOSIS — Z79899 Other long term (current) drug therapy: Secondary | ICD-10-CM | POA: Diagnosis not present

## 2020-12-18 DIAGNOSIS — R197 Diarrhea, unspecified: Secondary | ICD-10-CM | POA: Diagnosis not present

## 2020-12-18 DIAGNOSIS — Z803 Family history of malignant neoplasm of breast: Secondary | ICD-10-CM | POA: Diagnosis not present

## 2020-12-18 DIAGNOSIS — C50412 Malignant neoplasm of upper-outer quadrant of left female breast: Secondary | ICD-10-CM

## 2020-12-18 DIAGNOSIS — Z510 Encounter for antineoplastic radiation therapy: Secondary | ICD-10-CM | POA: Diagnosis not present

## 2020-12-18 DIAGNOSIS — Z8041 Family history of malignant neoplasm of ovary: Secondary | ICD-10-CM | POA: Insufficient documentation

## 2020-12-18 LAB — CBC WITH DIFFERENTIAL/PLATELET
Abs Immature Granulocytes: 0.01 10*3/uL (ref 0.00–0.07)
Basophils Absolute: 0 10*3/uL (ref 0.0–0.1)
Basophils Relative: 1 %
Eosinophils Absolute: 0 10*3/uL (ref 0.0–0.5)
Eosinophils Relative: 1 %
HCT: 35.8 % — ABNORMAL LOW (ref 36.0–46.0)
Hemoglobin: 11.7 g/dL — ABNORMAL LOW (ref 12.0–15.0)
Immature Granulocytes: 0 %
Lymphocytes Relative: 16 %
Lymphs Abs: 0.6 10*3/uL — ABNORMAL LOW (ref 0.7–4.0)
MCH: 31.1 pg (ref 26.0–34.0)
MCHC: 32.7 g/dL (ref 30.0–36.0)
MCV: 95.2 fL (ref 80.0–100.0)
Monocytes Absolute: 0.3 10*3/uL (ref 0.1–1.0)
Monocytes Relative: 7 %
Neutro Abs: 2.8 10*3/uL (ref 1.7–7.7)
Neutrophils Relative %: 75 %
Platelets: 183 10*3/uL (ref 150–400)
RBC: 3.76 MIL/uL — ABNORMAL LOW (ref 3.87–5.11)
RDW: 15.5 % (ref 11.5–15.5)
WBC: 3.8 10*3/uL — ABNORMAL LOW (ref 4.0–10.5)
nRBC: 0 % (ref 0.0–0.2)

## 2020-12-18 LAB — COMPREHENSIVE METABOLIC PANEL
ALT: 11 U/L (ref 0–44)
AST: 14 U/L — ABNORMAL LOW (ref 15–41)
Albumin: 4.1 g/dL (ref 3.5–5.0)
Alkaline Phosphatase: 61 U/L (ref 38–126)
Anion gap: 3 — ABNORMAL LOW (ref 5–15)
BUN: 23 mg/dL (ref 8–23)
CO2: 27 mmol/L (ref 22–32)
Calcium: 8.9 mg/dL (ref 8.9–10.3)
Chloride: 107 mmol/L (ref 98–111)
Creatinine, Ser: 0.64 mg/dL (ref 0.44–1.00)
GFR, Estimated: >60 mL/min (ref >60)
Glucose, Bld: 107 mg/dL — ABNORMAL HIGH (ref 70–99)
Potassium: 4.3 mmol/L (ref 3.5–5.1)
Sodium: 137 mmol/L (ref 135–145)
Total Bilirubin: 0.4 mg/dL (ref 0.3–1.2)
Total Protein: 6.3 g/dL — ABNORMAL LOW (ref 6.5–8.1)

## 2020-12-18 NOTE — Progress Notes (Signed)
Pt has no new concerns today other than when she can eat regular food. Rx for atorvastatin has expired.

## 2020-12-18 NOTE — Progress Notes (Signed)
[facility] NOTE  Patient Care Team: Sofie Hartigan, MD as PCP - General (Family Medicine) Theodore Demark, RN as Oncology Nurse Navigator Cammie Sickle, MD as Consulting Physician (Hematology and Oncology)  CHIEF COMPLAINTS/PURPOSE OF CONSULTATION: Breast cancer  #  Oncology History Overview Note  # JAN 2022-  0.5 cm irregular UPPER-OUTER LEFT breast mass with calcifications extending 1.5 cm anteroinferiorly, suspicious for malignancy. Ultrasound-guided biopsy of the mass is recommended. Stereotactic guided biopsy of the anterior/inferior most calcifications extending from this mass is also recommended. 2. Indeterminate 0.4 cm group of UPPER-OUTER LEFT breast calcifications, located 2.5 cm from the irregular mass above. 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 This was found to be concordant by Dr. Zerita Boers.   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 This was found to be concordant by Dr. Zerita Boers.   Pathology revealed COLUMNAR CELL CHANGE WITH ASSOCIATED CALCIFICATIONS, NEGATIVE FOR ATYPIA AND MALIGNANCY of the LEFT breast calcifications, medial upper outer quadrant, coil clip. This was found to be concordant by Dr. Zerita Boers.   Of note, the area of invasive mammary carcinoma spans at least 1.9 cm mammographically.  # BREAST, LEFT 2:00 7 CM FN; ULTRASOUND-GUIDED BIOPSY (VISION CLIP):  - INVASIVE MAMMARY CARCINOMA, NO SPECIAL TYPE.  Size of invasive carcinoma: 5 mm in this sample  Histologic grade  of invasive carcinoma: Grade 3                       Glandular/tubular differentiation score: 3                       Nuclear pleomorphism score: 3                       Mitotic rate score: 2                       Total score: 8  Ductal carcinoma in situ: Present, high-grade with calcification  Lymphovascular invasion: Not identified   B.  BREAST WITH CALCIFICATIONS, LEFT LATERAL UPPER OUTER QUADRANT;  STEREOTACTIC BIOPSY (X SHAPED CLIP):  - INVASIVE MAMMARY CARCINOMA, NO SPECIAL TYPE.  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  - CALCIFICATIONS ASSOCIATED WITH DCIS AND SCLEROSING ADENOSIS.  - AREAS SUSPICIOUS FOR LYMPHOVASCULAR INVASION.  Size of invasive carcinoma: 4 mm in this sample  Histologic grade of invasive carcinoma: Grade 3                       Glandular/tubular differentiation score: 3                       Nuclear pleomorphism score: 3                       Mitotic rate score: 2                       Total score: 8   C.  BREAST WITH CALCIFICATIONS, LEFT MEDIAL UPPER OUTER  QUADRANT;  STEREOTACTIC BIOPSY (COIL-SHAPED CLIP):  - COLUMNAR CELL CHANGE WITH ASSOCIATED CALCIFICATIONS.  - NEGATIVE FOR ATYPIA AND MALIGNANCY. BIOPSY- ER > 90%; PR-NEG; Her-2 NEG -----------------------------------------------------------------  TNM Descriptors: M (multifocal)  pT1a  Regional Lymph Nodes Modifier: SN  pN0  pM - Not applicable   #  HIGH RISK ONCOTYPE- RS score =35; which unfortunately translates to up to 23% risk of distant recurrence at 9 years.  Benefit from chemotherapy is more than 15%.  # JUne 2nd 2022- START TAX-CYTOXAN q3w x4.   Carcinoma of upper-outer quadrant of left breast in female, estrogen receptor positive ([place])  06/06/2020 Initial Diagnosis   Carcinoma of upper-outer quadrant of left breast in female, estrogen receptor positive ([place])   06/06/2020 Cancer Staging   Staging form: Breast, AJCC 8th Edition - Clinical: Stage IB  (cT1b, cN0, cM0, G3, ER+, PR-, HER2-) - Signed by Cammie Sickle, MD on 06/06/2020 Stage prefix: Initial diagnosis   09/16/2020 -  Chemotherapy    Patient is on Treatment Plan: BREAST TC Q21D          HISTORY OF PRESENTING ILLNESS: Patient is alone.  Ambulating independently. Candice Bowers 65 y.o.  female newly diagnosed stage I breast cancer ER/PR positive HER2 negative status postlumpectomy -high risk Oncotype currently s/p adjuvant chemotherapy with Taxotere Cytoxan.  Negative patient has been evaluated by radiation oncology.  Patient is getting breast radiation.   No nausea vomiting.  No fevers or chills.    Review of Systems  Constitutional:  Negative for chills, diaphoresis, fever, malaise/fatigue and weight loss.  HENT:  Negative for nosebleeds and sore throat.   Eyes:  Negative for double vision.  Respiratory:  Negative for cough, hemoptysis, sputum production, shortness of breath and wheezing.   Cardiovascular:  Negative for chest pain, palpitations, orthopnea and leg swelling.  Gastrointestinal:  Positive for diarrhea. Negative for abdominal pain, blood in stool, constipation, heartburn, melena, nausea and vomiting.  Genitourinary:  Negative for dysuria, frequency and urgency.  Musculoskeletal:  Positive for joint pain and myalgias. Negative for back pain.  Skin: Negative.  Negative for itching and rash.  Neurological:  Negative for dizziness, tingling, focal weakness, weakness and headaches.  Endo/Heme/Allergies:  Does not bruise/bleed easily.  Psychiatric/Behavioral:  Negative for depression. The patient is not nervous/anxious and does not have insomnia.     MEDICAL HISTORY:  Past Medical History:  Diagnosis Date   Carcinoma of upper-outer quadrant of left breast in female, estrogen receptor positive ([place]) 06/06/2020   Diabetes mellitus without complication (HCC)    diet controlled   Family history of breast cancer    Family history of ovarian cancer    GERD  (gastroesophageal reflux disease)    History of hiatal hernia    Hyperlipidemia    Hypertension    Migraines     SURGICAL HISTORY: Past Surgical History:  Procedure Laterality Date   BREAST BIOPSY Left 05/29/2020   affirm bx "X" clp-invasive   BREAST BIOPSY Left 05/29/2020   Affirm bx"Coil" clip-invasive   BREAST BIOPSY Right 2001   excisional biopsy   BREAST EXCISIONAL BIOPSY Right 06/19/1999   neg   BREAST LUMPECTOMY     BREAST LUMPECTOMY,RADIO FREQ LOCALIZER,AXILLARY SENTINEL LYMPH NODE BIOPSY Left 07/12/2020   Procedure: BREAST LUMPECTOMY,RADIO FREQ LOCALIZER,AXILLARY SENTINEL LYMPH NODE BIOPSY;  Surgeon: Fredirick Maudlin, MD;  Location: [facility];  Service: General;  Laterality: Left;   COLONOSCOPY  02/15/2009   EXCISION OF BREAST BIOPSY Left 07/12/2020   tag placement 07/10/20 vison  tag [id number]; hourglass tag [phone]; cylinder tag [phone]   TUBAL LIGATION      SOCIAL HISTORY: Social History   Socioeconomic History   Marital status: Married    Spouse name: Darrell   Number of children: Not on file   Years of education: Not on file   Highest education level: Not on file  Occupational History   Not on file  Tobacco Use   Smoking status: Former    Years: 20.00    Types: Cigarettes    Quit date: 2020    Years since quitting: 2.6   Smokeless tobacco: Never  Vaping Use   Vaping Use: Never used  Substance and Sexual Activity   Alcohol use: Never   Drug use: Never   Sexual activity: Not on file  Other Topics Concern   Not on file  Social History Narrative   Not on file   Social Determinants of Health   Financial Resource Strain: Not on file  Food Insecurity: Not on file  Transportation Needs: Not on file  Physical Activity: Not on file  Stress: Not on file  Social Connections: Not on file  Intimate Partner Violence: Not on file    FAMILY HISTORY: Family History  Problem Relation Age of Onset   Ovarian cancer Sister 31   Breast cancer Maternal Grandmother         dx 81s   Breast cancer Paternal Grandmother     ALLERGIES:  has No Known Allergies.  MEDICATIONS:  Current Outpatient Medications  Medication Sig Dispense Refill   aspirin-acetaminophen-caffeine (EXCEDRIN MIGRAINE) 250-250-65 MG tablet Take 1 tablet by mouth daily as needed (headaches).     Cholecalciferol 25 MCG (1000 UT) tablet Take 1,000 Units by mouth daily.     magnesium oxide (MAG-OX) 400 MG tablet Take 400 mg by mouth daily.     Omega-3 Fatty Acids (FISH OIL) 1000 MG CAPS Take 1,000 mg by mouth daily.     omeprazole (PRILOSEC) 20 MG capsule Take 20 mg by mouth daily.     valsartan (DIOVAN) 80 MG tablet Take 80 mg by mouth daily.     atorvastatin (LIPITOR) 10 MG tablet Take 10 mg by mouth daily.     dexamethasone (DECADRON) 4 MG tablet Take one pill AM & PM for 2 days. TAKE day prior & day AFTER chemo. Do NOT take on day of chemo. 20 tablet 0   ondansetron (ZOFRAN) 8 MG tablet One pill every 8 hours as needed for nausea/vomitting. 40 tablet 1   prochlorperazine (COMPAZINE) 10 MG tablet Take 1 tablet (10 mg total) by mouth every 6 (six) hours as needed for nausea or vomiting. 40 tablet 1   No current facility-administered medications for this visit.      Marland Kitchen  PHYSICAL EXAMINATION: ECOG PERFORMANCE STATUS: 0 - Asymptomatic  Vitals:   12/18/20 1011  BP: 127/65  Pulse: 67  Resp: 18  Temp: (!) 96.3 F (35.7 C)  SpO2: 100%   Filed Weights   12/18/20 1011  Weight: 152 lb 12.8 oz (69.3 kg)    Physical Exam HENT:     Head: Normocephalic and atraumatic.     Mouth/Throat:     Pharynx: No oropharyngeal exudate.  Eyes:     Pupils: Pupils are equal, round, and reactive to light.  Cardiovascular:     Rate and Rhythm: Normal rate and regular rhythm.  Pulmonary:     Effort: No respiratory distress.     Breath sounds: No wheezing.  Abdominal:  General: Bowel sounds are normal. There is no distension.     Palpations: Abdomen is soft. There is no mass.     Tenderness:  There is no abdominal tenderness. There is no guarding or rebound.  Musculoskeletal:        General: No tenderness. Normal range of motion.     Cervical back: Normal range of motion and neck supple.  Skin:    General: Skin is warm.  Neurological:     Mental Status: She is alert and oriented to person, place, and time.  Psychiatric:        Mood and Affect: Affect normal.     LABORATORY DATA:  I have reviewed the data as listed Lab Results  Component Value Date   WBC 3.8 (L) 12/18/2020   HGB 11.7 (L) 12/18/2020   HCT 35.8 (L) 12/18/2020   MCV 95.2 12/18/2020   PLT 183 12/18/2020   Recent Labs    10/28/20 0817 11/07/20 1319 11/18/20 0858 11/28/20 1047 12/18/20 0900  NA 137   < > 135 137 137  K 4.3   < > 3.7 4.0 4.3  CL 106   < > 103 103 107  CO2 24   < > '26 27 27  ' GLUCOSE 110*   < > 99 108* 107*  BUN 20   < > '19 12 23  ' CREATININE 0.72   < > 0.66 0.86 0.64  CALCIUM 9.3   < > 9.0 8.8* 8.9  GFRNONAA >60   < > >60 >60 >60  PROT 6.7  --  6.7  --  6.3*  ALBUMIN 4.1  --  4.1  --  4.1  AST 15  --  14*  --  14*  ALT 14  --  12  --  11  ALKPHOS 70  --  66  --  61  BILITOT 0.6  --  0.7  --  0.4   < > = values in this interval not displayed.    RADIOGRAPHIC STUDIES: I have personally reviewed the radiological images as listed and agreed with the findings in the report. No results found.  ASSESSMENT & PLAN:   Carcinoma of upper-outer quadrant of left breast in female, estrogen receptor positive ([place]) #Stage I breast cancer- pT1a [5 mm; 4 mm] grade 3; pN0-postlumpectomy no residual malignancy noted.  ER positive PR negative; HER2 negative. HIGH RISK ONCOTYPE- RS score =35. S/p  Taxotere-Cytoxan- cycle #4.  Today.Labs today reviewed;  acceptable for treatment today.  curently on adjuvant  radiation oncology.[finish oct 3rd. ]  # Patient will be candidate for AI post radiation. #Discussed the mechanism of action of aromatase inhibitors-with blocking of estrogen to prevent breast  cancer.  Also discussed the potential side effects including but not limited to arthralgias hot flashes and increased risk of osteoporosis.order BMD prior.  Plan starting anastrozole at next visit.  # Diarrhea- G-1 monitor closely- resolved  # DISPOSITION: # follow up in 6 weeks- MD; BMD priro- Dr.B  All questions were answered. The patient/family knows to call the clinic with any problems, questions or concerns.   Cammie Sickle, MD 12/18/2020 1:45 PM

## 2020-12-18 NOTE — Assessment & Plan Note (Addendum)
#  Stage I breast cancer- pT1a [5 mm; 4 mm] grade 3; pN0-postlumpectomy no residual malignancy noted.  ER positive PR negative; HER2 negative. HIGH RISK ONCOTYPE- RS score =35. S/p  Taxotere-Cytoxan- cycle #4.  Today.Labs today reviewed;  acceptable for treatment today.  curently on adjuvant  radiation oncology.[finish oct 3rd. ]  # Patient will be candidate for AI post radiation. #Discussed the mechanism of action of aromatase inhibitors-with blocking of estrogen to prevent breast cancer.  Also discussed the potential side effects including but not limited to arthralgias hot flashes and increased risk of osteoporosis.order BMD prior.  Plan starting anastrozole at next visit.  # Diarrhea- G-1 monitor closely- resolved  # DISPOSITION: # follow up in 6 weeks- MD; BMD priro- Dr.B

## 2020-12-19 ENCOUNTER — Ambulatory Visit
Admission: RE | Admit: 2020-12-19 | Discharge: 2020-12-19 | Disposition: A | Discharge status: Home / Self Care | Payer: Medicare HMO | Source: Ambulatory Visit | Attending: Radiation Oncology | Admitting: Radiation Oncology

## 2020-12-19 DIAGNOSIS — Z510 Encounter for antineoplastic radiation therapy: Secondary | ICD-10-CM | POA: Diagnosis not present

## 2020-12-20 ENCOUNTER — Ambulatory Visit
Admission: RE | Admit: 2020-12-20 | Discharge: 2020-12-20 | Disposition: A | Discharge status: Home / Self Care | Payer: Medicare HMO | Source: Ambulatory Visit | Attending: Radiation Oncology | Admitting: Radiation Oncology

## 2020-12-20 DIAGNOSIS — Z510 Encounter for antineoplastic radiation therapy: Secondary | ICD-10-CM | POA: Diagnosis not present

## 2020-12-23 ENCOUNTER — Ambulatory Visit
Admission: RE | Admit: 2020-12-23 | Discharge: 2020-12-23 | Disposition: A | Discharge status: Home / Self Care | Payer: Medicare HMO | Source: Ambulatory Visit | Attending: Radiation Oncology | Admitting: Radiation Oncology

## 2020-12-23 DIAGNOSIS — Z510 Encounter for antineoplastic radiation therapy: Secondary | ICD-10-CM | POA: Diagnosis not present

## 2020-12-24 ENCOUNTER — Ambulatory Visit
Admission: RE | Admit: 2020-12-24 | Discharge: 2020-12-24 | Disposition: A | Discharge status: Home / Self Care | Payer: Medicare HMO | Source: Ambulatory Visit | Attending: Radiation Oncology | Admitting: Radiation Oncology

## 2020-12-24 DIAGNOSIS — Z510 Encounter for antineoplastic radiation therapy: Secondary | ICD-10-CM | POA: Diagnosis not present

## 2020-12-25 ENCOUNTER — Ambulatory Visit
Admission: RE | Admit: 2020-12-25 | Discharge: 2020-12-25 | Disposition: A | Discharge status: Home / Self Care | Payer: Medicare HMO | Source: Ambulatory Visit | Attending: Radiation Oncology | Admitting: Radiation Oncology

## 2020-12-25 DIAGNOSIS — Z510 Encounter for antineoplastic radiation therapy: Secondary | ICD-10-CM | POA: Diagnosis not present

## 2020-12-26 ENCOUNTER — Ambulatory Visit
Admission: RE | Admit: 2020-12-26 | Discharge: 2020-12-26 | Disposition: A | Discharge status: Home / Self Care | Payer: Medicare HMO | Source: Ambulatory Visit | Attending: Radiation Oncology | Admitting: Radiation Oncology

## 2020-12-26 DIAGNOSIS — Z510 Encounter for antineoplastic radiation therapy: Secondary | ICD-10-CM | POA: Diagnosis not present

## 2020-12-27 ENCOUNTER — Ambulatory Visit
Admission: RE | Admit: 2020-12-27 | Discharge: 2020-12-27 | Disposition: A | Discharge status: Home / Self Care | Payer: Medicare HMO | Source: Ambulatory Visit | Attending: Radiation Oncology | Admitting: Radiation Oncology

## 2020-12-27 DIAGNOSIS — Z510 Encounter for antineoplastic radiation therapy: Secondary | ICD-10-CM | POA: Diagnosis not present

## 2020-12-30 ENCOUNTER — Ambulatory Visit
Admission: RE | Admit: 2020-12-30 | Discharge: 2020-12-30 | Disposition: A | Discharge status: Home / Self Care | Payer: Medicare HMO | Source: Ambulatory Visit | Attending: Radiation Oncology | Admitting: Radiation Oncology

## 2020-12-30 DIAGNOSIS — Z510 Encounter for antineoplastic radiation therapy: Secondary | ICD-10-CM | POA: Diagnosis not present

## 2020-12-31 ENCOUNTER — Ambulatory Visit
Admission: RE | Admit: 2020-12-31 | Discharge: 2020-12-31 | Disposition: A | Discharge status: Home / Self Care | Payer: Medicare HMO | Source: Ambulatory Visit | Attending: Radiation Oncology | Admitting: Radiation Oncology

## 2020-12-31 DIAGNOSIS — Z510 Encounter for antineoplastic radiation therapy: Secondary | ICD-10-CM | POA: Diagnosis not present

## 2021-01-01 ENCOUNTER — Ambulatory Visit
Admission: RE | Admit: 2021-01-01 | Discharge: 2021-01-01 | Disposition: A | Discharge status: Home / Self Care | Payer: Medicare HMO | Source: Ambulatory Visit | Attending: Radiation Oncology | Admitting: Radiation Oncology

## 2021-01-01 ENCOUNTER — Other Ambulatory Visit: Payer: Self-pay

## 2021-01-01 ENCOUNTER — Inpatient Hospital Stay: Payer: Medicare HMO

## 2021-01-01 DIAGNOSIS — Z510 Encounter for antineoplastic radiation therapy: Secondary | ICD-10-CM | POA: Diagnosis not present

## 2021-01-01 DIAGNOSIS — Z170 Estrogen receptor positive status [ER+]: Secondary | ICD-10-CM

## 2021-01-01 DIAGNOSIS — C50412 Malignant neoplasm of upper-outer quadrant of left female breast: Secondary | ICD-10-CM

## 2021-01-01 LAB — CBC
HCT: 36.7 % (ref 36.0–46.0)
Hemoglobin: 11.8 g/dL — ABNORMAL LOW (ref 12.0–15.0)
MCH: 30.9 pg (ref 26.0–34.0)
MCHC: 32.2 g/dL (ref 30.0–36.0)
MCV: 96.1 fL (ref 80.0–100.0)
Platelets: 170 10*3/uL (ref 150–400)
RBC: 3.82 MIL/uL — ABNORMAL LOW (ref 3.87–5.11)
RDW: 14.1 % (ref 11.5–15.5)
WBC: 4.3 10*3/uL (ref 4.0–10.5)
nRBC: 0 % (ref 0.0–0.2)

## 2021-01-02 ENCOUNTER — Ambulatory Visit
Admission: RE | Admit: 2021-01-02 | Discharge: 2021-01-02 | Disposition: A | Discharge status: Home / Self Care | Payer: Medicare HMO | Source: Ambulatory Visit | Attending: Radiation Oncology | Admitting: Radiation Oncology

## 2021-01-02 DIAGNOSIS — Z510 Encounter for antineoplastic radiation therapy: Secondary | ICD-10-CM | POA: Diagnosis not present

## 2021-01-03 ENCOUNTER — Ambulatory Visit
Admission: RE | Admit: 2021-01-03 | Discharge: 2021-01-03 | Disposition: A | Discharge status: Home / Self Care | Payer: Medicare HMO | Source: Ambulatory Visit | Attending: Radiation Oncology | Admitting: Radiation Oncology

## 2021-01-03 DIAGNOSIS — Z510 Encounter for antineoplastic radiation therapy: Secondary | ICD-10-CM | POA: Diagnosis not present

## 2021-01-06 ENCOUNTER — Ambulatory Visit
Admission: RE | Admit: 2021-01-06 | Discharge: 2021-01-06 | Disposition: A | Discharge status: Home / Self Care | Payer: Medicare HMO | Source: Ambulatory Visit | Attending: Radiation Oncology | Admitting: Radiation Oncology

## 2021-01-06 DIAGNOSIS — Z510 Encounter for antineoplastic radiation therapy: Secondary | ICD-10-CM | POA: Diagnosis not present

## 2021-01-07 ENCOUNTER — Ambulatory Visit
Admission: RE | Admit: 2021-01-07 | Discharge: 2021-01-07 | Disposition: A | Discharge status: Home / Self Care | Payer: Medicare HMO | Source: Ambulatory Visit | Attending: Radiation Oncology | Admitting: Radiation Oncology

## 2021-01-07 ENCOUNTER — Ambulatory Visit
Admission: RE | Admit: 2021-01-07 | Discharge: 2021-01-07 | Disposition: A | Discharge status: Home / Self Care | Payer: Medicare HMO | Source: Ambulatory Visit | Attending: Internal Medicine | Admitting: Internal Medicine

## 2021-01-07 ENCOUNTER — Other Ambulatory Visit: Payer: Self-pay

## 2021-01-07 DIAGNOSIS — Z510 Encounter for antineoplastic radiation therapy: Secondary | ICD-10-CM | POA: Diagnosis not present

## 2021-01-07 DIAGNOSIS — C50412 Malignant neoplasm of upper-outer quadrant of left female breast: Secondary | ICD-10-CM | POA: Diagnosis not present

## 2021-01-07 DIAGNOSIS — Z170 Estrogen receptor positive status [ER+]: Secondary | ICD-10-CM | POA: Diagnosis not present

## 2021-01-07 DIAGNOSIS — Z9221 Personal history of antineoplastic chemotherapy: Secondary | ICD-10-CM | POA: Insufficient documentation

## 2021-01-07 DIAGNOSIS — Z13820 Encounter for screening for osteoporosis: Secondary | ICD-10-CM | POA: Diagnosis not present

## 2021-01-07 DIAGNOSIS — M8589 Other specified disorders of bone density and structure, multiple sites: Secondary | ICD-10-CM | POA: Diagnosis not present

## 2021-01-07 DIAGNOSIS — Z923 Personal history of irradiation: Secondary | ICD-10-CM | POA: Insufficient documentation

## 2021-01-07 DIAGNOSIS — Z780 Asymptomatic menopausal state: Secondary | ICD-10-CM | POA: Insufficient documentation

## 2021-01-08 ENCOUNTER — Ambulatory Visit
Admission: RE | Admit: 2021-01-08 | Discharge: 2021-01-08 | Disposition: A | Discharge status: Home / Self Care | Payer: Medicare HMO | Source: Ambulatory Visit | Attending: Radiation Oncology | Admitting: Radiation Oncology

## 2021-01-08 DIAGNOSIS — Z510 Encounter for antineoplastic radiation therapy: Secondary | ICD-10-CM | POA: Diagnosis not present

## 2021-01-09 ENCOUNTER — Ambulatory Visit
Admission: RE | Admit: 2021-01-09 | Discharge: 2021-01-09 | Disposition: A | Discharge status: Home / Self Care | Payer: Medicare HMO | Source: Ambulatory Visit | Attending: Radiation Oncology | Admitting: Radiation Oncology

## 2021-01-09 DIAGNOSIS — Z510 Encounter for antineoplastic radiation therapy: Secondary | ICD-10-CM | POA: Diagnosis not present

## 2021-01-10 ENCOUNTER — Ambulatory Visit
Admission: RE | Admit: 2021-01-10 | Discharge: 2021-01-10 | Disposition: A | Discharge status: Home / Self Care | Payer: Medicare HMO | Source: Ambulatory Visit | Attending: Radiation Oncology | Admitting: Radiation Oncology

## 2021-01-10 DIAGNOSIS — Z510 Encounter for antineoplastic radiation therapy: Secondary | ICD-10-CM | POA: Diagnosis not present

## 2021-01-13 ENCOUNTER — Ambulatory Visit
Admission: RE | Admit: 2021-01-13 | Discharge: 2021-01-13 | Disposition: A | Discharge status: Home / Self Care | Payer: Medicare HMO | Source: Ambulatory Visit | Attending: Radiation Oncology | Admitting: Radiation Oncology

## 2021-01-13 DIAGNOSIS — Z510 Encounter for antineoplastic radiation therapy: Secondary | ICD-10-CM | POA: Insufficient documentation

## 2021-01-13 DIAGNOSIS — C50412 Malignant neoplasm of upper-outer quadrant of left female breast: Secondary | ICD-10-CM | POA: Insufficient documentation

## 2021-01-27 ENCOUNTER — Encounter: Payer: Self-pay | Admitting: General Surgery

## 2021-01-29 ENCOUNTER — Other Ambulatory Visit: Payer: Self-pay

## 2021-01-29 ENCOUNTER — Inpatient Hospital Stay: Payer: Medicare HMO | Attending: Internal Medicine | Admitting: Internal Medicine

## 2021-01-29 DIAGNOSIS — M791 Myalgia, unspecified site: Secondary | ICD-10-CM | POA: Insufficient documentation

## 2021-01-29 DIAGNOSIS — Z8041 Family history of malignant neoplasm of ovary: Secondary | ICD-10-CM | POA: Diagnosis not present

## 2021-01-29 DIAGNOSIS — Z923 Personal history of irradiation: Secondary | ICD-10-CM | POA: Diagnosis not present

## 2021-01-29 DIAGNOSIS — Z170 Estrogen receptor positive status [ER+]: Secondary | ICD-10-CM | POA: Diagnosis not present

## 2021-01-29 DIAGNOSIS — R202 Paresthesia of skin: Secondary | ICD-10-CM | POA: Insufficient documentation

## 2021-01-29 DIAGNOSIS — Z87891 Personal history of nicotine dependence: Secondary | ICD-10-CM | POA: Insufficient documentation

## 2021-01-29 DIAGNOSIS — M2550 Pain in unspecified joint: Secondary | ICD-10-CM | POA: Insufficient documentation

## 2021-01-29 DIAGNOSIS — C50412 Malignant neoplasm of upper-outer quadrant of left female breast: Secondary | ICD-10-CM | POA: Insufficient documentation

## 2021-01-29 DIAGNOSIS — R200 Anesthesia of skin: Secondary | ICD-10-CM | POA: Insufficient documentation

## 2021-01-29 DIAGNOSIS — Z803 Family history of malignant neoplasm of breast: Secondary | ICD-10-CM | POA: Insufficient documentation

## 2021-01-29 DIAGNOSIS — R21 Rash and other nonspecific skin eruption: Secondary | ICD-10-CM | POA: Insufficient documentation

## 2021-01-29 DIAGNOSIS — Z79899 Other long term (current) drug therapy: Secondary | ICD-10-CM | POA: Diagnosis not present

## 2021-01-29 MED ORDER — ANASTROZOLE 1 MG PO TABS: 1.0000 mg | ORAL_TABLET | Freq: Every day | ORAL | 1 refills | Status: DC

## 2021-01-29 NOTE — Assessment & Plan Note (Addendum)
#  Stage I breast cancer- pT1a [5 mm; 4 mm] grade 3; pN0- ER positive PR negative; HER2 negative. HIGH RISK ONCOTYPE- RS score =35. S/p  Taxotere-Cytoxan- cycle #4.  Curently s/p adjuvant  radiation oncology.[finish oct 3rd.]  #Proceed with anastrozole.  Again reviewed the potential side effects including but not limited to hot flashes arthralgias risk of osteoporosis.  Anastrozole prescription sent.  Adjuvant Zometa/see below.  #September 2022-bone density osteopenia T score -2.1;  Discussed the potential risk factors for osteoporosis- age/gender/postmenopausal status/use of anti-estrogen treatments. Discussed multiple options including exercise/ calcium and vitamin D supplementation/ and also use of bisphosphonates. continue exercises; vit D + ca+vit D. Discussed the potential benefits and/side effects  Including but not limited to Osteonecrosis of jaw/ hypocalcemia.  Interested/plan next visit.  # vaccination counseling: Recommend flu shots/ COVID-19 booster/ Pneumonia shots.   # DISPOSITION: # follow up in 3 months- MD; labs- cbc/cmp;Zometa  Dr.B

## 2021-01-29 NOTE — Progress Notes (Signed)
[facility] NOTE  Patient Care Team: Sofie Hartigan, MD as PCP - General (Family Medicine) Theodore Demark, RN as Oncology Nurse Navigator Cammie Sickle, MD as Consulting Physician (Hematology and Oncology)  CHIEF COMPLAINTS/PURPOSE OF CONSULTATION: Breast cancer  #  Oncology History Overview Note  # JAN 2022-  0.5 cm irregular UPPER-OUTER LEFT breast mass with calcifications extending 1.5 cm anteroinferiorly, suspicious for malignancy. Ultrasound-guided biopsy of the mass is recommended. Stereotactic guided biopsy of the anterior/inferior most calcifications extending from this mass is also recommended. 2. Indeterminate 0.4 cm group of UPPER-OUTER LEFT breast calcifications, located 2.5 cm from the irregular mass above. 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 This was found to be concordant by Dr. Zerita Boers.   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 This was found to be concordant by Dr. Zerita Boers.   Pathology revealed COLUMNAR CELL CHANGE WITH ASSOCIATED CALCIFICATIONS, NEGATIVE FOR ATYPIA AND MALIGNANCY of the LEFT breast calcifications, medial upper outer quadrant, coil clip. This was found to be concordant by Dr. Zerita Boers.   Of note, the area of invasive mammary carcinoma spans at least 1.9 cm mammographically.  # BREAST, LEFT 2:00 7 CM FN; ULTRASOUND-GUIDED BIOPSY (VISION CLIP):  - INVASIVE MAMMARY CARCINOMA, NO SPECIAL TYPE.  Size of invasive carcinoma: 5 mm in this sample  Histologic grade  of invasive carcinoma: Grade 3                       Glandular/tubular differentiation score: 3                       Nuclear pleomorphism score: 3                       Mitotic rate score: 2                       Total score: 8  Ductal carcinoma in situ: Present, high-grade with calcification  Lymphovascular invasion: Not identified   B.  BREAST WITH CALCIFICATIONS, LEFT LATERAL UPPER OUTER QUADRANT;  STEREOTACTIC BIOPSY (X SHAPED CLIP):  - INVASIVE MAMMARY CARCINOMA, NO SPECIAL TYPE.  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  - CALCIFICATIONS ASSOCIATED WITH DCIS AND SCLEROSING ADENOSIS.  - AREAS SUSPICIOUS FOR LYMPHOVASCULAR INVASION.  Size of invasive carcinoma: 4 mm in this sample  Histologic grade of invasive carcinoma: Grade 3                       Glandular/tubular differentiation score: 3                       Nuclear pleomorphism score: 3                       Mitotic rate score: 2                       Total score: 8   C.  BREAST WITH CALCIFICATIONS, LEFT MEDIAL UPPER OUTER  QUADRANT;  STEREOTACTIC BIOPSY (COIL-SHAPED CLIP):  - COLUMNAR CELL CHANGE WITH ASSOCIATED CALCIFICATIONS.  - NEGATIVE FOR ATYPIA AND MALIGNANCY. BIOPSY- ER > 90%; PR-NEG; Her-2 NEG -----------------------------------------------------------------  TNM Descriptors: M (multifocal)  pT1a  Regional Lymph Nodes Modifier: SN  pN0  pM - Not applicable   #  HIGH RISK ONCOTYPE- RS score =35; which unfortunately translates to up to 23% risk of distant recurrence at 9 years.  Benefit from chemotherapy is more than 15%.  # JUne 2nd 2022- START TAX-CYTOXAN q3w x4. S/p RT  #October 2022-anastrozole; # FEB 2022-plan adjuvant Zometa every 6 months 35 years   Carcinoma of upper-outer quadrant of left breast in female, estrogen receptor positive ([place])  06/06/2020 Initial Diagnosis   Carcinoma of upper-outer quadrant of left breast in female, estrogen receptor positive ([place])    06/06/2020 Cancer Staging   Staging form: Breast, AJCC 8th Edition - Clinical: Stage IB (cT1b, cN0, cM0, G3, ER+, PR-, HER2-) - Signed by Cammie Sickle, MD on 06/06/2020 Stage prefix: Initial diagnosis   09/16/2020 -  Chemotherapy    Patient is on Treatment Plan: BREAST TC Q21D          HISTORY OF PRESENTING ILLNESS: Patient is accompanied by her husband.  Ambulating independently. Candice Bowers 65 y.o.  female newly diagnosed stage I breast cancer ER/PR positive HER2 negative status postlumpectomy -high risk Oncotype currently s/p s/p adjuvant chemotherapy/radiation is here for follow-up.  Patient is currently s/p adjuvant radiation.  Tolerated well except for mild rash.  Mild tingling and numbness in the extremities.  Not any worse.  Review of Systems  Constitutional:  Negative for chills, diaphoresis, fever, malaise/fatigue and weight loss.  HENT:  Negative for nosebleeds and sore throat.   Eyes:  Negative for double vision.  Respiratory:  Negative for cough, hemoptysis, sputum production, shortness of breath and wheezing.   Cardiovascular:  Negative for chest pain, palpitations, orthopnea and leg swelling.  Gastrointestinal:  Negative for abdominal pain, blood in stool, constipation, heartburn, melena, nausea and vomiting.  Genitourinary:  Negative for dysuria, frequency and urgency.  Musculoskeletal:  Positive for joint pain and myalgias. Negative for back pain.  Skin: Negative.  Negative for itching and rash.  Neurological:  Positive for tingling. Negative for dizziness, focal weakness, weakness and headaches.  Endo/Heme/Allergies:  Does not bruise/bleed easily.  Psychiatric/Behavioral:  Negative for depression. The patient is not nervous/anxious and does not have insomnia.     MEDICAL HISTORY:  Past Medical History:  Diagnosis Date   Carcinoma of upper-outer quadrant of left breast in female, estrogen receptor positive ([place]) 06/06/2020   Diabetes mellitus without  complication ([place])    diet controlled   Family history of breast cancer    Family history of ovarian cancer    GERD (gastroesophageal reflux disease)    History of hiatal hernia    Hyperlipidemia    Hypertension    Migraines     SURGICAL HISTORY: Past Surgical History:  Procedure Laterality Date   BREAST BIOPSY Left 05/29/2020   affirm bx "X" clp-invasive   BREAST BIOPSY Left 05/29/2020   Affirm bx"Coil" clip-invasive   BREAST BIOPSY Right 2001   excisional biopsy   BREAST EXCISIONAL BIOPSY Right 06/19/1999   neg   BREAST LUMPECTOMY     BREAST LUMPECTOMY,RADIO FREQ LOCALIZER,AXILLARY SENTINEL LYMPH NODE BIOPSY Left 07/12/2020   Procedure: BREAST LUMPECTOMY,RADIO FREQ LOCALIZER,AXILLARY SENTINEL LYMPH NODE BIOPSY;  Surgeon: Fredirick Maudlin, MD;  Location: [facility] ORS;  Service: General;  Laterality: Left;  COLONOSCOPY  02/15/2009   EXCISION OF BREAST BIOPSY Left 07/12/2020   tag placement 07/10/20 vison tag [id number]; hourglass tag [id number]; cylinder tag [id number]   TUBAL LIGATION      SOCIAL HISTORY: Social History   Socioeconomic History   Marital status: Married    Spouse name: Darrell   Number of children: Not on file   Years of education: Not on file   Highest education level: Not on file  Occupational History   Not on file  Tobacco Use   Smoking status: Former    Years: 20.00    Types: Cigarettes    Quit date: 2020    Years since quitting: 2.8   Smokeless tobacco: Never  Vaping Use   Vaping Use: Never used  Substance and Sexual Activity   Alcohol use: Never   Drug use: Never   Sexual activity: Not on file  Other Topics Concern   Not on file  Social History Narrative   Not on file   Social Determinants of Health   Financial Resource Strain: Not on file  Food Insecurity: Not on file  Transportation Needs: Not on file  Physical Activity: Not on file  Stress: Not on file  Social Connections: Not on file  Intimate Partner Violence: Not on file    FAMILY  HISTORY: Family History  Problem Relation Age of Onset   Ovarian cancer Sister 89   Breast cancer Maternal Grandmother        dx 54s   Breast cancer Paternal Grandmother     ALLERGIES:  has No Known Allergies.  MEDICATIONS:  Current Outpatient Medications  Medication Sig Dispense Refill   anastrozole (ARIMIDEX) 1 MG tablet Take 1 tablet (1 mg total) by mouth daily. 90 tablet 1   aspirin-acetaminophen-caffeine (EXCEDRIN MIGRAINE) 250-250-65 MG tablet Take 1 tablet by mouth daily as needed (headaches).     atorvastatin (LIPITOR) 10 MG tablet Take 10 mg by mouth daily.     Cholecalciferol 25 MCG (1000 UT) tablet Take 1,000 Units by mouth daily.     dexamethasone (DECADRON) 4 MG tablet Take one pill AM & PM for 2 days. TAKE day prior & day AFTER chemo. Do NOT take on day of chemo. 20 tablet 0   magnesium oxide (MAG-OX) 400 MG tablet Take 400 mg by mouth daily.     Omega-3 Fatty Acids (FISH OIL) 1000 MG CAPS Take 1,000 mg by mouth daily.     omeprazole (PRILOSEC) 20 MG capsule Take 20 mg by mouth daily.     ondansetron (ZOFRAN) 8 MG tablet One pill every 8 hours as needed for nausea/vomitting. 40 tablet 1   prochlorperazine (COMPAZINE) 10 MG tablet Take 1 tablet (10 mg total) by mouth every 6 (six) hours as needed for nausea or vomiting. 40 tablet 1   valsartan (DIOVAN) 80 MG tablet Take 80 mg by mouth daily.     No current facility-administered medications for this visit.      Marland Kitchen  PHYSICAL EXAMINATION: ECOG PERFORMANCE STATUS: 0 - Asymptomatic  Vitals:   01/29/21 1038  BP: 126/79  Pulse: 64  Resp: 18  Temp: 98 F (36.7 C)  SpO2: 100%   Filed Weights   01/29/21 1038  Weight: 150 lb (68 kg)    Physical Exam HENT:     Head: Normocephalic and atraumatic.     Mouth/Throat:     Pharynx: No oropharyngeal exudate.  Eyes:     Pupils: Pupils are equal, round, and reactive to light.  Cardiovascular:     Rate and Rhythm: Normal rate and regular rhythm.  Pulmonary:      Effort: No respiratory distress.     Breath sounds: No wheezing.  Abdominal:     General: Bowel sounds are normal. There is no distension.     Palpations: Abdomen is soft. There is no mass.     Tenderness: There is no abdominal tenderness. There is no guarding or rebound.  Musculoskeletal:        General: No tenderness. Normal range of motion.     Cervical back: Normal range of motion and neck supple.  Skin:    General: Skin is warm.  Neurological:     Mental Status: She is alert and oriented to person, place, and time.  Psychiatric:        Mood and Affect: Affect normal.     LABORATORY DATA:  I have reviewed the data as listed Lab Results  Component Value Date   WBC 4.3 01/01/2021   HGB 11.8 (L) 01/01/2021   HCT 36.7 01/01/2021   MCV 96.1 01/01/2021   PLT 170 01/01/2021   Recent Labs    10/28/20 0817 11/07/20 1319 11/18/20 0858 11/28/20 1047 12/18/20 0900  NA 137   < > 135 137 137  K 4.3   < > 3.7 4.0 4.3  CL 106   < > 103 103 107  CO2 24   < > '26 27 27  ' GLUCOSE 110*   < > 99 108* 107*  BUN 20   < > '19 12 23  ' CREATININE 0.72   < > 0.66 0.86 0.64  CALCIUM 9.3   < > 9.0 8.8* 8.9  GFRNONAA >60   < > >60 >60 >60  PROT 6.7  --  6.7  --  6.3*  ALBUMIN 4.1  --  4.1  --  4.1  AST 15  --  14*  --  14*  ALT 14  --  12  --  11  ALKPHOS 70  --  66  --  61  BILITOT 0.6  --  0.7  --  0.4   < > = values in this interval not displayed.    RADIOGRAPHIC STUDIES: I have personally reviewed the radiological images as listed and agreed with the findings in the report. DG Bone Density  Result Date: 01/07/2021 EXAM: DUAL X-RAY ABSORPTIOMETRY (DXA) FOR BONE MINERAL DENSITY IMPRESSION: Dear Dr. Rogue Bussing, Your patient Candice Bowers completed a FRAX assessment on 01/07/2021 using the [employer] (analysis version: 14.10) manufactured by [employer]. The following summarizes the results of our evaluation. PATIENT BIOGRAPHICAL: Name: Candice Bowers, Candice Bowers Patient ID: [id number]  Birth Date: 01-02-1956 Height:    63.0 in. Gender:     Female    [age]       Weight:    149.9 lbs. Ethnicity:  White                            Exam Date: 01/07/2021 FRAX* RESULTS:  (version: 3.5) 10-year Probability of Fracture1 Major Osteoporotic Fracture2 Hip Fracture 9.5% 1.2% Population: [place] (Caucasian) Risk Factors: None Based on Femur (Right) Neck BMD 1 -The 10-year probability of fracture may be lower than reported if the patient has received treatment. 2 -Major Osteoporotic Fracture: Clinical Spine, Forearm, Hip or Shoulder *FRAX is a Materials engineer of the [employer] for Metabolic Bone Disease,  a [place] (WHO) [facility]. ASSESSMENT: The probability of a major osteoporotic fracture is 9.5% within the next ten years. The probability of a hip fracture is 1.2% within the next ten years. . Your patient [place] completed a BMD test on 01/07/2021 using the Honor (software version: 14.10) manufactured by [employer]. The following summarizes the results of our evaluation. Technologist: [facility] PATIENT BIOGRAPHICAL: Name: Candice Bowers, Candice Bowers Patient ID: [id number] Birth Date: Mar 08, 1956 Height: 63.0 in. Gender: Female Exam Date: 01/07/2021 Weight: 149.9 lbs. Indications: Breast CA, Caucasian, History of Chemo, History of Radiation, Postmenopausal Fractures: Treatments: Vitamin D DENSITOMETRY RESULTS: Site      Region     Measured Date Measured Age WHO Classification [place] Adult T-score BMD         %Change vs. Previous Significant Change (*) AP Spine L1-L4 01/07/2021 65.5 Osteopenia -2.1 0.932 g/cm2 DualFemur Neck Right 01/07/2021 65.5 Osteopenia -1.7 0.808 g/cm2 ASSESSMENT: The BMD measured at AP Spine L1-L4 is 0.932 g/cm2 with a T-score of -2.1. This patient is considered osteopenic according to [facility]) criteria. The scan quality is good. World Pharmacologist [facility]) criteria for  post-menopausal, Caucasian Women: Normal:                   T-score at or above -1 SD Osteopenia/low bone mass: T-score between -1 and -2.5 SD Osteoporosis:             T-score at or below -2.5 SD RECOMMENDATIONS: 1. All patients should optimize calcium and vitamin D intake. 2. Consider FDA-approved medical therapies in postmenopausal women and men aged 41 years and older, based on the following: a. A hip or vertebral(clinical or morphometric) fracture b. T-score < -2.5 at the femoral neck or spine after appropriate evaluation to exclude secondary causes c. Low bone mass (T-score between -1.0 and -2.5 at the femoral neck or spine) and a 10-year probability of a hip fracture > 3% or a 10-year probability of a major osteoporosis-related fracture > 20% based on the [place]-adapted WHO algorithm 3. Clinician judgment and/or patient preferences may indicate treatment for people with 10-year fracture probabilities above or below these levels FOLLOW-UP: People with diagnosed cases of osteoporosis or at high risk for fracture should have regular bone mineral density tests. For patients eligible for Medicare, routine testing is allowed once every 2 years. The testing frequency can be increased to one year for patients who have rapidly progressing disease, those who are receiving or discontinuing medical therapy to restore bone mass, or have additional risk factors. I have reviewed this report, and agree with the above findings. Mark A. Thornton Papas, M.D. [facility] Electronically Signed   By: Lavonia Dana M.D.   On: 01/07/2021 16:01    ASSESSMENT & PLAN:   Carcinoma of upper-outer quadrant of left breast in female, estrogen receptor positive ([place]) #Stage I breast cancer- pT1a [5 mm; 4 mm] grade 3; pN0-postlumpectomy no residual malignancy noted.  ER positive PR negative; HER2 negative. HIGH RISK ONCOTYPE- RS score =35. S/p  Taxotere-Cytoxan- cycle #4.  Today. Labs today reviewed;  acceptable for treatment today.   curently on adjuvant  radiation oncology.[finish oct 3rd.]  # Patient will be candidate for AI post radiation. #Discussed the mechanism of action of aromatase inhibitors-with blocking of estrogen to prevent breast cancer.  Also discussed the potential side effects including but not limited to arthralgias hot flashes and increased risk of osteoporosis.order BMD prior.  Plan starting anastrozole  at next visit.  The BMD measured at AP Spine L1-L4 is 0.932 g/cm2 with a T-score of -2.1. This patient is considered osteopenic according to [facility]) criteria.   Excercises; vit D + cad=D.Marland Kitchen   # vaccination: Flu shots/ COVID-19 booster/ Pneumonia shots.   # DISPOSITION: # follow up in 3 months- MD; labs- cbc/cmp;Zometa  Dr.B  All questions were answered. The patient/family knows to call the clinic with any problems, questions or concerns.   Cammie Sickle, MD 01/29/2021 2:13 PM

## 2021-01-29 NOTE — Progress Notes (Signed)
Numbness and tingling in pointer finger and thumb on right hand after her last visit.

## 2021-02-13 ENCOUNTER — Other Ambulatory Visit: Payer: Self-pay

## 2021-02-13 ENCOUNTER — Ambulatory Visit
Admission: RE | Admit: 2021-02-13 | Discharge: 2021-02-13 | Disposition: A | Discharge status: Home / Self Care | Payer: Medicare HMO | Source: Ambulatory Visit | Attending: Radiation Oncology | Admitting: Radiation Oncology

## 2021-02-13 VITALS — BP 134/87 | HR 60 | Temp 96.1°F | Resp 17 | Wt 149.5 lb

## 2021-02-13 DIAGNOSIS — C50412 Malignant neoplasm of upper-outer quadrant of left female breast: Secondary | ICD-10-CM | POA: Diagnosis present

## 2021-02-13 DIAGNOSIS — Z79811 Long term (current) use of aromatase inhibitors: Secondary | ICD-10-CM | POA: Insufficient documentation

## 2021-02-13 DIAGNOSIS — Z170 Estrogen receptor positive status [ER+]: Secondary | ICD-10-CM | POA: Diagnosis not present

## 2021-02-13 DIAGNOSIS — Z923 Personal history of irradiation: Secondary | ICD-10-CM | POA: Diagnosis not present

## 2021-02-13 NOTE — Progress Notes (Signed)
Radiation Oncology Follow up Note  Name: Candice Bowers   Date:   02/13/2021 MRN:  [id number] DOB: 1956-03-08    This 65 y.o. female presents to the clinic today for 1 month follow-up status post whole breast radiation to her left breast for stage Ib (T1b N0 M0) ER positive PR negative invasive mammary carcinoma.  REFERRING PROVIDER: Sofie Hartigan, MD  HPI: Patient is a 65 year old female now at 1 month having completed whole breast radiation to her left breast for stage Ib invasive mammary carcinoma ER positive.  Seen today in routine follow-up she is doing well.  She specifically denies breast tenderness cough or bone pain..  She has been started on Arimidex tolerating that well without side effects.  COMPLICATIONS OF TREATMENT: none  FOLLOW UP COMPLIANCE: keeps appointments   PHYSICAL EXAM:  BP 134/87 (Patient Position: Sitting)   Pulse 60   Temp (!) 96.1 F (35.6 C) (Tympanic)   Resp 17   Wt 149 lb 8 oz (67.8 kg)   SpO2 100%   BMI 25.66 kg/m  Lungs are clear to A&P cardiac examination essentially unremarkable with regular rate and rhythm. No dominant mass or nodularity is noted in either breast in 2 positions examined. Incision is well-healed. No axillary or supraclavicular adenopathy is appreciated. Cosmetic result is excellent.  Still some hyperpigmentation left breast which we would expect.  Cosmetic result is still good to excellent.  Well-developed well-nourished patient in NAD. HEENT reveals PERLA, EOMI, discs not visualized.  Oral cavity is clear. No oral mucosal lesions are identified. Neck is clear without evidence of cervical or supraclavicular adenopathy. Lungs are clear to A&P. Cardiac examination is essentially unremarkable with regular rate and rhythm without murmur rub or thrill. Abdomen is benign with no organomegaly or masses noted. Motor sensory and DTR levels are equal and symmetric in the upper and lower extremities. Cranial nerves II through XII are grossly  intact. Proprioception is intact. No peripheral adenopathy or edema is identified. No motor or sensory levels are noted. Crude visual fields are within normal range.  RADIOLOGY RESULTS: No current films for review  PLAN: Present time patient is doing well 1 month out from whole breast radiation very little side effects.  On pleased with her overall progress.  She continues on Arimidex.  Of asked to see her back in 4 to 5 months for follow-up.  Patient knows to call with any concerns.  I would like to take this opportunity to thank you for allowing me to participate in the care of your patient.Noreene Filbert, MD

## 2021-05-01 ENCOUNTER — Other Ambulatory Visit: Payer: Self-pay

## 2021-05-01 ENCOUNTER — Inpatient Hospital Stay: Payer: Medicare HMO

## 2021-05-01 ENCOUNTER — Inpatient Hospital Stay (HOSPITAL_BASED_OUTPATIENT_CLINIC_OR_DEPARTMENT_OTHER): Payer: Medicare HMO | Admitting: Internal Medicine

## 2021-05-01 ENCOUNTER — Encounter: Payer: Self-pay | Admitting: Internal Medicine

## 2021-05-01 ENCOUNTER — Inpatient Hospital Stay: Payer: Medicare HMO | Attending: Internal Medicine

## 2021-05-01 DIAGNOSIS — Z170 Estrogen receptor positive status [ER+]: Secondary | ICD-10-CM

## 2021-05-01 DIAGNOSIS — C50412 Malignant neoplasm of upper-outer quadrant of left female breast: Secondary | ICD-10-CM

## 2021-05-01 DIAGNOSIS — Z7952 Long term (current) use of systemic steroids: Secondary | ICD-10-CM | POA: Insufficient documentation

## 2021-05-01 DIAGNOSIS — Z79899 Other long term (current) drug therapy: Secondary | ICD-10-CM | POA: Insufficient documentation

## 2021-05-01 DIAGNOSIS — Z87891 Personal history of nicotine dependence: Secondary | ICD-10-CM | POA: Insufficient documentation

## 2021-05-01 DIAGNOSIS — M2550 Pain in unspecified joint: Secondary | ICD-10-CM | POA: Insufficient documentation

## 2021-05-01 DIAGNOSIS — Z803 Family history of malignant neoplasm of breast: Secondary | ICD-10-CM | POA: Insufficient documentation

## 2021-05-01 DIAGNOSIS — M791 Myalgia, unspecified site: Secondary | ICD-10-CM | POA: Diagnosis not present

## 2021-05-01 DIAGNOSIS — Z8041 Family history of malignant neoplasm of ovary: Secondary | ICD-10-CM | POA: Insufficient documentation

## 2021-05-01 DIAGNOSIS — M8580 Other specified disorders of bone density and structure, unspecified site: Secondary | ICD-10-CM | POA: Insufficient documentation

## 2021-05-01 LAB — CBC WITH DIFFERENTIAL/PLATELET
Abs Immature Granulocytes: 0.01 10*3/uL (ref 0.00–0.07)
Basophils Absolute: 0 10*3/uL (ref 0.0–0.1)
Basophils Relative: 1 %
Eosinophils Absolute: 0 10*3/uL (ref 0.0–0.5)
Eosinophils Relative: 1 %
HCT: 37.9 % (ref 36.0–46.0)
Hemoglobin: 12.7 g/dL (ref 12.0–15.0)
Immature Granulocytes: 0 %
Lymphocytes Relative: 24 %
Lymphs Abs: 1 10*3/uL (ref 0.7–4.0)
MCH: 30.2 pg (ref 26.0–34.0)
MCHC: 33.5 g/dL (ref 30.0–36.0)
MCV: 90.2 fL (ref 80.0–100.0)
Monocytes Absolute: 0.3 10*3/uL (ref 0.1–1.0)
Monocytes Relative: 7 %
Neutro Abs: 2.9 10*3/uL (ref 1.7–7.7)
Neutrophils Relative %: 67 %
Platelets: 183 10*3/uL (ref 150–400)
RBC: 4.2 MIL/uL (ref 3.87–5.11)
RDW: 13.7 % (ref 11.5–15.5)
WBC: 4.2 10*3/uL (ref 4.0–10.5)
nRBC: 0 % (ref 0.0–0.2)

## 2021-05-01 LAB — COMPREHENSIVE METABOLIC PANEL
ALT: 16 U/L (ref 0–44)
AST: 15 U/L (ref 15–41)
Albumin: 4.5 g/dL (ref 3.5–5.0)
Alkaline Phosphatase: 68 U/L (ref 38–126)
Anion gap: 4 — ABNORMAL LOW (ref 5–15)
BUN: 20 mg/dL (ref 8–23)
CO2: 27 mmol/L (ref 22–32)
Calcium: 9.2 mg/dL (ref 8.9–10.3)
Chloride: 107 mmol/L (ref 98–111)
Creatinine, Ser: 0.8 mg/dL (ref 0.44–1.00)
GFR, Estimated: >60 mL/min (ref >60)
Glucose, Bld: 110 mg/dL — ABNORMAL HIGH (ref 70–99)
Potassium: 4.3 mmol/L (ref 3.5–5.1)
Sodium: 138 mmol/L (ref 135–145)
Total Bilirubin: 0.4 mg/dL (ref 0.3–1.2)
Total Protein: 6.8 g/dL (ref 6.5–8.1)

## 2021-05-01 MED ORDER — SODIUM CHLORIDE 0.9 % IV SOLN
Freq: Once | INTRAVENOUS | Status: AC
  Filled 2021-05-01: qty 250

## 2021-05-01 MED ORDER — ZOLEDRONIC ACID 4 MG/100ML IV SOLN
4.0000 mg | Freq: Once | INTRAVENOUS | Status: AC
  Administered 2021-05-01: 4 mg via INTRAVENOUS
  Filled 2021-05-01: qty 100

## 2021-05-01 NOTE — Progress Notes (Signed)
Survivorship Care Plan visit completed.  Treatment summary reviewed and given to patient.  ASCO answers booklet reviewed and given to patient.  CARE program and Cancer Transitions discussed with patient along with other resources cancer center offers to patients and caregivers.  Patient verbalized understanding.    

## 2021-05-01 NOTE — Progress Notes (Signed)
[name] NOTE  Patient Care Team: Sofie Hartigan, MD as PCP - General (Family Medicine) Theodore Demark, RN as Oncology Nurse Navigator Cammie Sickle, MD as Consulting Physician (Hematology and Oncology) Noreene Filbert, MD as Consulting Physician (Radiation Oncology)  CHIEF COMPLAINTS/PURPOSE OF CONSULTATION: Breast cancer  #  Oncology History Overview Note  # JAN 2022-  0.5 cm irregular UPPER-OUTER LEFT breast mass with calcifications extending 1.5 cm anteroinferiorly, suspicious for malignancy. Ultrasound-guided biopsy of the mass is recommended. Stereotactic guided biopsy of the anterior/inferior most calcifications extending from this mass is also recommended. 2. Indeterminate 0.4 cm group of UPPER-OUTER LEFT breast calcifications, located 2.5 cm from the irregular mass above. 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 This was found to be concordant by Dr. Zerita Boers.   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 This was found to be concordant by Dr. Zerita Boers.   Pathology revealed COLUMNAR CELL CHANGE WITH ASSOCIATED CALCIFICATIONS, NEGATIVE FOR ATYPIA AND MALIGNANCY of the LEFT breast calcifications, medial upper outer quadrant, coil clip. This was found to be concordant by Dr. Zerita Boers.   Of note, the area of invasive mammary carcinoma spans at least 1.9 cm mammographically.  # BREAST, LEFT 2:00 7 CM FN; ULTRASOUND-GUIDED BIOPSY (VISION CLIP):  - INVASIVE MAMMARY CARCINOMA, NO SPECIAL TYPE.   Size of invasive carcinoma: 5 mm in this sample  Histologic grade of invasive carcinoma: Grade 3                       Glandular/tubular differentiation score: 3                       Nuclear pleomorphism score: 3                       Mitotic rate score: 2                       Total score: 8  Ductal carcinoma in situ: Present, high-grade with calcification  Lymphovascular invasion: Not identified   B.  BREAST WITH CALCIFICATIONS, LEFT LATERAL UPPER OUTER QUADRANT;  STEREOTACTIC BIOPSY (X SHAPED CLIP):  - INVASIVE MAMMARY CARCINOMA, NO SPECIAL TYPE.  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  - CALCIFICATIONS ASSOCIATED WITH DCIS AND SCLEROSING ADENOSIS.  - AREAS SUSPICIOUS FOR LYMPHOVASCULAR INVASION.  Size of invasive carcinoma: 4 mm in this sample  Histologic grade of invasive carcinoma: Grade 3                       Glandular/tubular differentiation score: 3                       Nuclear pleomorphism score: 3                       Mitotic rate score: 2                       Total score: 8   C.  BREAST WITH CALCIFICATIONS, LEFT MEDIAL UPPER OUTER QUADRANT;  STEREOTACTIC BIOPSY (COIL-SHAPED CLIP):  - COLUMNAR CELL CHANGE WITH ASSOCIATED CALCIFICATIONS.  - NEGATIVE FOR ATYPIA AND MALIGNANCY. BIOPSY- ER > 90%; PR-NEG; Her-2 NEG -----------------------------------------------------------------  TNM Descriptors: M (multifocal)  pT1a  Regional Lymph Nodes Modifier: SN  pN0  pM - Not applicable   #  HIGH RISK ONCOTYPE- RS score =35; which unfortunately translates to up to 23% risk of distant recurrence at 9 years.  Benefit from chemotherapy is more than 15%.  # JUne 2nd 2022- START TAX-CYTOXAN q3w x4. S/p RT  #October 2022-anastrozole; # FEB 2022-plan adjuvant Zometa every 6 months 3-5 years   Carcinoma of upper-outer quadrant of left breast in female, estrogen receptor positive ([place])  06/06/2020 Initial Diagnosis   Carcinoma of upper-outer  quadrant of left breast in female, estrogen receptor positive ([place])   06/06/2020 Cancer Staging   Staging form: Breast, AJCC 8th Edition - Clinical: Stage IB (cT1b, cN0, cM0, G3, ER+, PR-, HER2-) - Signed by Cammie Sickle, MD on 06/06/2020 Stage prefix: Initial diagnosis    09/16/2020 -  Chemotherapy    Patient is on Treatment Plan: BREAST TC Q21D          HISTORY OF PRESENTING ILLNESS: Alone.  Ambulating independently.  Candice Bowers 65 y.o.  female newly diagnosed stage I breast cancer ER/PR positive HER2 negative -currently on adjuvant anastrozole is here for follow-up.  Patient denies any worsening joint pains.  Denies any worsening bone pain.  No hot flashes.  Review of Systems  Constitutional:  Negative for chills, diaphoresis, fever, malaise/fatigue and weight loss.  HENT:  Negative for nosebleeds and sore throat.   Eyes:  Negative for double vision.  Respiratory:  Negative for cough, hemoptysis, sputum production, shortness of breath and wheezing.   Cardiovascular:  Negative for chest pain, palpitations, orthopnea and leg swelling.  Gastrointestinal:  Negative for abdominal pain, blood in stool, constipation, heartburn, melena, nausea and vomiting.  Genitourinary:  Negative for dysuria, frequency and urgency.  Musculoskeletal:  Positive for joint pain and myalgias. Negative for back pain.  Skin: Negative.  Negative for itching and rash.  Neurological:  Positive for tingling. Negative for dizziness, focal weakness, weakness and headaches.  Endo/Heme/Allergies:  Does not bruise/bleed easily.  Psychiatric/Behavioral:  Negative for depression. The patient is not nervous/anxious and does not have insomnia.     MEDICAL HISTORY:  Past Medical History:  Diagnosis Date   Carcinoma of upper-outer quadrant of left breast in female, estrogen receptor positive ([employer]) 06/06/2020   Diabetes mellitus without complication ([place])    diet controlled   Family history of breast cancer     Family history of ovarian cancer    GERD (gastroesophageal reflux disease)    History of hiatal hernia    Hyperlipidemia    Hypertension    Migraines     SURGICAL HISTORY: Past Surgical History:  Procedure Laterality Date   BREAST BIOPSY Left 05/29/2020   affirm bx "X" clp-invasive   BREAST BIOPSY Left 05/29/2020   Affirm bx"Coil" clip-invasive   BREAST BIOPSY Right 2001   excisional biopsy   BREAST EXCISIONAL BIOPSY Right 06/19/1999   neg   BREAST LUMPECTOMY     BREAST LUMPECTOMY,RADIO FREQ LOCALIZER,AXILLARY SENTINEL LYMPH NODE BIOPSY Left 07/12/2020   Procedure: BREAST LUMPECTOMY,RADIO FREQ LOCALIZER,AXILLARY SENTINEL LYMPH NODE BIOPSY;  Surgeon: Fredirick Maudlin, MD;  Location: [facility] ORS;  Service: General;  Laterality: Left;   COLONOSCOPY  02/15/2009   EXCISION OF BREAST BIOPSY Left  07/12/2020   tag placement 07/10/20 vison tag [id number]; hourglass tag [id number]; cylinder tag [id number]   TUBAL LIGATION      SOCIAL HISTORY: Social History   Socioeconomic History   Marital status: Married    Spouse name: Darrell   Number of children: Not on file   Years of education: Not on file   Highest education level: Not on file  Occupational History   Not on file  Tobacco Use   Smoking status: Former    Years: 20.00    Types: Cigarettes    Quit date: 2020    Years since quitting: 3.0   Smokeless tobacco: Never  Vaping Use   Vaping Use: Never used  Substance and Sexual Activity   Alcohol use: Never   Drug use: Never   Sexual activity: Not on file  Other Topics Concern   Not on file  Social History Narrative   Not on file   Social Determinants of Health   Financial Resource Strain: Not on file  Food Insecurity: Not on file  Transportation Needs: Not on file  Physical Activity: Not on file  Stress: Not on file  Social Connections: Not on file  Intimate Partner Violence: Not on file    FAMILY HISTORY: Family History  Problem Relation Age of Onset   Ovarian cancer Sister  56   Breast cancer Maternal Grandmother        dx 35s   Breast cancer Paternal Grandmother     ALLERGIES:  has No Known Allergies.  MEDICATIONS:  Current Outpatient Medications  Medication Sig Dispense Refill   anastrozole (ARIMIDEX) 1 MG tablet Take 1 tablet (1 mg total) by mouth daily. 90 tablet 1   aspirin-acetaminophen-caffeine (EXCEDRIN MIGRAINE) 250-250-65 MG tablet Take 1 tablet by mouth daily as needed (headaches).     Cholecalciferol 25 MCG (1000 UT) tablet Take 1,000 Units by mouth daily.     magnesium oxide (MAG-OX) 400 MG tablet Take 400 mg by mouth daily.     Omega-3 Fatty Acids (FISH OIL) 1000 MG CAPS Take 1,000 mg by mouth daily.     omeprazole (PRILOSEC) 20 MG capsule Take 20 mg by mouth daily.     valsartan (DIOVAN) 80 MG tablet Take 80 mg by mouth daily.     atorvastatin (LIPITOR) 10 MG tablet Take 10 mg by mouth daily.     dexamethasone (DECADRON) 4 MG tablet Take one pill AM & PM for 2 days. TAKE day prior & day AFTER chemo. Do NOT take on day of chemo. 20 tablet 0   ondansetron (ZOFRAN) 8 MG tablet One pill every 8 hours as needed for nausea/vomitting. 40 tablet 1   prochlorperazine (COMPAZINE) 10 MG tablet Take 1 tablet (10 mg total) by mouth every 6 (six) hours as needed for nausea or vomiting. 40 tablet 1   No current facility-administered medications for this visit.      Marland Kitchen  PHYSICAL EXAMINATION: ECOG PERFORMANCE STATUS: 0 - Asymptomatic  Vitals:   05/01/21 1049  BP: 117/79  Pulse: 64  Temp: (!) 96.6 F (35.9 C)  SpO2: 100%   Filed Weights   05/01/21 1049  Weight: 146 lb (66.2 kg)    Physical Exam HENT:     Head: Normocephalic and atraumatic.     Mouth/Throat:     Pharynx: No oropharyngeal exudate.  Eyes:     Pupils: Pupils are equal, round, and reactive to light.  Cardiovascular:     Rate and Rhythm: Normal rate and regular  rhythm.  Pulmonary:     Effort: No respiratory distress.     Breath sounds: No wheezing.  Abdominal:      General: Bowel sounds are normal. There is no distension.     Palpations: Abdomen is soft. There is no mass.     Tenderness: There is no abdominal tenderness. There is no guarding or rebound.  Musculoskeletal:        General: No tenderness. Normal range of motion.     Cervical back: Normal range of motion and neck supple.  Skin:    General: Skin is warm.  Neurological:     Mental Status: She is alert and oriented to person, place, and time.  Psychiatric:        Mood and Affect: Affect normal.     LABORATORY DATA:  I have reviewed the data as listed Lab Results  Component Value Date   WBC 4.2 05/01/2021   HGB 12.7 05/01/2021   HCT 37.9 05/01/2021   MCV 90.2 05/01/2021   PLT 183 05/01/2021   Recent Labs    11/18/20 0858 11/28/20 1047 12/18/20 0900 05/01/21 1036  NA 135 137 137 138  K 3.7 4.0 4.3 4.3  CL 103 103 107 107  CO2 '26 27 27 27  ' GLUCOSE 99 108* 107* 110*  BUN '19 12 23 20  ' CREATININE 0.66 0.86 0.64 0.80  CALCIUM 9.0 8.8* 8.9 9.2  GFRNONAA >60 >60 >60 >60  PROT 6.7  --  6.3* 6.8  ALBUMIN 4.1  --  4.1 4.5  AST 14*  --  14* 15  ALT 12  --  11 16  ALKPHOS 66  --  61 68  BILITOT 0.7  --  0.4 0.4    RADIOGRAPHIC STUDIES: I have personally reviewed the radiological images as listed and agreed with the findings in the report. No results found.  ASSESSMENT & PLAN:   Carcinoma of upper-outer quadrant of left breast in female, estrogen receptor positive ([place]) #Stage I breast cancer- pT1a [5 mm; 4 mm] grade 3; pN0- ER positive PR negative; HER2 negative. HIGH RISK ONCOTYPE- RS score =35. Currently on anastrazole [oct 2022]; continue  with anastrozole- tolerating well.   #September 2022-bone density osteopenia T score -2.1; I again reviewed/discussed the potential risk factors for osteoporosis- age/gender/postmenopausal status/use of anti-estrogen treatments. Discussed multiple options including exercise/ calcium and vitamin D supplementation/ and also use of  bisphosphonates. continue exercises; vit D + ca+vit D. Discussed the potential benefits and/side effects  Including but not limited to Osteonecrosis of jaw/ hypocalcemia.  Patient currently exercises/walks 3 miles a day.  Proceed with Zometa today.   # DISPOSITION: # Zometa today # follow up in 6 months- MD; labs- cbc/cmp;Zometa  Dr.B   All questions were answered. The patient/family knows to call the clinic with any problems, questions or concerns.   Cammie Sickle, MD 05/01/2021 1:20 PM

## 2021-05-01 NOTE — Assessment & Plan Note (Addendum)
#  Stage I breast cancer- pT1a [5 mm; 4 mm] grade 3; pN0- ER positive PR negative; HER2 negative. HIGH RISK ONCOTYPE- RS score =35. Currently on anastrazole [oct 2022]; continue  with anastrozole- tolerating well.   #September 2022-bone density osteopenia T score -2.1; I again reviewed/discussed the potential risk factors for osteoporosis- age/gender/postmenopausal status/use of anti-estrogen treatments. Discussed multiple options including exercise/ calcium and vitamin D supplementation/ and also use of bisphosphonates. continue exercises; vit D + ca+vit D. Discussed the potential benefits and/side effects  Including but not limited to Osteonecrosis of jaw/ hypocalcemia.  Patient currently exercises/walks 3 miles a day.  Proceed with Zometa today.   # DISPOSITION: # Zometa today # follow up in 6 months- MD; labs- cbc/cmp;Zometa  Dr.B

## 2021-07-14 ENCOUNTER — Other Ambulatory Visit: Payer: Self-pay

## 2021-07-14 ENCOUNTER — Encounter: Payer: Self-pay | Admitting: Radiation Oncology

## 2021-07-14 ENCOUNTER — Ambulatory Visit
Admission: RE | Admit: 2021-07-14 | Discharge: 2021-07-14 | Disposition: A | Discharge status: Home / Self Care | Payer: Medicare HMO | Source: Ambulatory Visit | Attending: Radiation Oncology | Admitting: Radiation Oncology

## 2021-07-14 VITALS — BP 147/83 | HR 59 | Temp 96.9°F | Resp 16 | Ht 64.0 in | Wt 149.1 lb

## 2021-07-14 DIAGNOSIS — Z923 Personal history of irradiation: Secondary | ICD-10-CM | POA: Diagnosis not present

## 2021-07-14 DIAGNOSIS — Z170 Estrogen receptor positive status [ER+]: Secondary | ICD-10-CM | POA: Diagnosis not present

## 2021-07-14 DIAGNOSIS — Z79811 Long term (current) use of aromatase inhibitors: Secondary | ICD-10-CM | POA: Diagnosis not present

## 2021-07-14 DIAGNOSIS — C50412 Malignant neoplasm of upper-outer quadrant of left female breast: Secondary | ICD-10-CM | POA: Insufficient documentation

## 2021-07-14 NOTE — Progress Notes (Signed)
Radiation Oncology ?Follow up Note ? ?Name: Candice Bowers   ?Date:   07/14/2021 ?MRN:  [id number] ?DOB: 1956/02/06  ? ? ?This 66 y.o. female presents to the clinic today for 26-monthfollow-up status post whole breast radiation to her left breast for stage Ib.  (T1b N0 M0) ER positive PR negative invasive mammary carcinoma status post wide local excision ? ?REFERRING PROVIDER: FSofie Hartigan MD ? ?HPI: Patient is a 66year old female now out 6 months having completed whole breast radiation to her left breast for stage Ib ER positive invasive mammary carcinoma.  Seen today in routine follow-up she is doing well.  She specifically denies breast tenderness cough or bone pain..  She is currently on Arimidex tolerating it well without side effect.  She has not yet had follow-up mammograms. ? ?COMPLICATIONS OF TREATMENT: none ? ?FOLLOW UP COMPLIANCE: keeps appointments  ? ?PHYSICAL EXAM:  ?BP (!) 147/83 (BP Location: Left Arm, Patient Position: Sitting)   Pulse (!) 59   Temp (!) 96.9 ?F (36.1 ?C) (Tympanic)   Resp 16   Ht '5\' 4"'$  (1.626 m)   Wt 149 lb 1.6 oz (67.6 kg)   BMI 25.59 kg/m?  ?Left breast shows some retraction of her scar although cosmetic result is still good.  No dominant masses noted in either breast.  No axillary or supraclavicular adenopathy is appreciated.  Well-developed well-nourished patient in NAD. HEENT reveals PERLA, EOMI, discs not visualized.  Oral cavity is clear. No oral mucosal lesions are identified. Neck is clear without evidence of cervical or supraclavicular adenopathy. Lungs are clear to A&P. Cardiac examination is essentially unremarkable with regular rate and rhythm without murmur rub or thrill. Abdomen is benign with no organomegaly or masses noted. Motor sensory and DTR levels are equal and symmetric in the upper and lower extremities. Cranial nerves II through XII are grossly intact. Proprioception is intact. No peripheral adenopathy or edema is identified. No motor or sensory  levels are noted. Crude visual fields are within normal range. ? ?RADIOLOGY RESULTS: No current films for review ? ?PLAN: Present time patient is doing well with no evidence of disease 6 months out.  I have asked to see her back in 6 months anticipate she will have a mammogram by that time.  She continues on Arimidex without side effect.  Patient knows to call with any concerns. ? ?I would like to take this opportunity to thank you for allowing me to participate in the care of your patient.. ?  ? GNoreene Filbert MD ? ?

## 2021-07-21 ENCOUNTER — Other Ambulatory Visit: Payer: Self-pay | Admitting: Internal Medicine

## 2021-10-31 ENCOUNTER — Inpatient Hospital Stay: Payer: Medicare HMO | Attending: Oncology

## 2021-10-31 ENCOUNTER — Ambulatory Visit: Payer: Medicare HMO | Admitting: Internal Medicine

## 2021-10-31 ENCOUNTER — Inpatient Hospital Stay: Payer: Medicare HMO

## 2021-10-31 ENCOUNTER — Encounter: Payer: Self-pay | Admitting: Oncology

## 2021-10-31 ENCOUNTER — Inpatient Hospital Stay (HOSPITAL_BASED_OUTPATIENT_CLINIC_OR_DEPARTMENT_OTHER): Payer: Medicare HMO | Admitting: Oncology

## 2021-10-31 VITALS — BP 132/76 | HR 65 | Temp 98.7°F | Resp 19 | Wt 153.0 lb

## 2021-10-31 VITALS — BP 129/87 | HR 67 | Temp 98.1°F | Resp 18

## 2021-10-31 DIAGNOSIS — Z79899 Other long term (current) drug therapy: Secondary | ICD-10-CM | POA: Diagnosis not present

## 2021-10-31 DIAGNOSIS — Z8041 Family history of malignant neoplasm of ovary: Secondary | ICD-10-CM | POA: Diagnosis not present

## 2021-10-31 DIAGNOSIS — Z170 Estrogen receptor positive status [ER+]: Secondary | ICD-10-CM | POA: Diagnosis not present

## 2021-10-31 DIAGNOSIS — Z803 Family history of malignant neoplasm of breast: Secondary | ICD-10-CM | POA: Insufficient documentation

## 2021-10-31 DIAGNOSIS — C50412 Malignant neoplasm of upper-outer quadrant of left female breast: Secondary | ICD-10-CM | POA: Insufficient documentation

## 2021-10-31 DIAGNOSIS — M8580 Other specified disorders of bone density and structure, unspecified site: Secondary | ICD-10-CM | POA: Diagnosis not present

## 2021-10-31 DIAGNOSIS — Z79811 Long term (current) use of aromatase inhibitors: Secondary | ICD-10-CM | POA: Diagnosis not present

## 2021-10-31 LAB — CBC WITH DIFFERENTIAL/PLATELET
Abs Immature Granulocytes: 0.01 10*3/uL (ref 0.00–0.07)
Basophils Absolute: 0 10*3/uL (ref 0.0–0.1)
Basophils Relative: 1 %
Eosinophils Absolute: 0 10*3/uL (ref 0.0–0.5)
Eosinophils Relative: 1 %
HCT: 38.6 % (ref 36.0–46.0)
Hemoglobin: 12.9 g/dL (ref 12.0–15.0)
Immature Granulocytes: 0 %
Lymphocytes Relative: 23 %
Lymphs Abs: 1.4 10*3/uL (ref 0.7–4.0)
MCH: 30.7 pg (ref 26.0–34.0)
MCHC: 33.4 g/dL (ref 30.0–36.0)
MCV: 91.9 fL (ref 80.0–100.0)
Monocytes Absolute: 0.4 10*3/uL (ref 0.1–1.0)
Monocytes Relative: 6 %
Neutro Abs: 4.1 10*3/uL (ref 1.7–7.7)
Neutrophils Relative %: 69 %
Platelets: 202 10*3/uL (ref 150–400)
RBC: 4.2 MIL/uL (ref 3.87–5.11)
RDW: 13.1 % (ref 11.5–15.5)
WBC: 5.9 10*3/uL (ref 4.0–10.5)
nRBC: 0 % (ref 0.0–0.2)

## 2021-10-31 LAB — COMPREHENSIVE METABOLIC PANEL
ALT: 24 U/L (ref 0–44)
AST: 22 U/L (ref 15–41)
Albumin: 4.2 g/dL (ref 3.5–5.0)
Alkaline Phosphatase: 59 U/L (ref 38–126)
Anion gap: 6 (ref 5–15)
BUN: 20 mg/dL (ref 8–23)
CO2: 25 mmol/L (ref 22–32)
Calcium: 9.5 mg/dL (ref 8.9–10.3)
Chloride: 108 mmol/L (ref 98–111)
Creatinine, Ser: 0.81 mg/dL (ref 0.44–1.00)
GFR, Estimated: >60 mL/min (ref >60)
Glucose, Bld: 96 mg/dL (ref 70–99)
Potassium: 4 mmol/L (ref 3.5–5.1)
Sodium: 139 mmol/L (ref 135–145)
Total Bilirubin: 0.2 mg/dL — ABNORMAL LOW (ref 0.3–1.2)
Total Protein: 7 g/dL (ref 6.5–8.1)

## 2021-10-31 MED ORDER — SODIUM CHLORIDE 0.9 % IV SOLN
Freq: Once | INTRAVENOUS | Status: AC
  Filled 2021-10-31: qty 250

## 2021-10-31 MED ORDER — ZOLEDRONIC ACID 4 MG/100ML IV SOLN
4.0000 mg | Freq: Once | INTRAVENOUS | Status: AC
  Administered 2021-10-31: 4 mg via INTRAVENOUS
  Filled 2021-10-31: qty 100

## 2021-10-31 NOTE — Progress Notes (Signed)
[facility] NOTE  Patient Care Team: Sofie Hartigan, MD as PCP - General (Family Medicine) Theodore Demark, RN (Inactive) as Oncology Nurse Navigator Cammie Sickle, MD as Consulting Physician (Hematology and Oncology) Noreene Filbert, MD as Consulting Physician (Radiation Oncology)  CHIEF COMPLAINTS/PURPOSE OF CONSULTATION: Breast cancer  #  Oncology History Overview Note  # JAN 2022-  0.5 cm irregular UPPER-OUTER LEFT breast mass with calcifications extending 1.5 cm anteroinferiorly, suspicious for malignancy. Ultrasound-guided biopsy of the mass is recommended. Stereotactic guided biopsy of the anterior/inferior most calcifications extending from this mass is also recommended. 2. Indeterminate 0.4 cm group of UPPER-OUTER LEFT breast calcifications, located 2.5 cm from the irregular mass above. 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 This was found to be concordant by Dr. Zerita Boers.   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 This was found to be concordant by Dr. Zerita Boers.   Pathology revealed COLUMNAR CELL CHANGE WITH ASSOCIATED CALCIFICATIONS, NEGATIVE FOR ATYPIA AND MALIGNANCY of the LEFT breast calcifications, medial upper outer quadrant, coil clip. This was found to be concordant by Dr. Zerita Boers.   Of note, the area of invasive mammary carcinoma spans at least 1.9 cm mammographically.  # BREAST, LEFT 2:00 7 CM FN; ULTRASOUND-GUIDED BIOPSY (VISION CLIP):  - INVASIVE MAMMARY CARCINOMA, NO  SPECIAL TYPE.  Size of invasive carcinoma: 5 mm in this sample  Histologic grade of invasive carcinoma: Grade 3                       Glandular/tubular differentiation score: 3                       Nuclear pleomorphism score: 3                       Mitotic rate score: 2                       Total score: 8  Ductal carcinoma in situ: Present, high-grade with calcification  Lymphovascular invasion: Not identified   B.  BREAST WITH CALCIFICATIONS, LEFT LATERAL UPPER OUTER QUADRANT;  STEREOTACTIC BIOPSY (X SHAPED CLIP):  - INVASIVE MAMMARY CARCINOMA, NO SPECIAL TYPE.  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  - CALCIFICATIONS ASSOCIATED WITH DCIS AND SCLEROSING ADENOSIS.  - AREAS SUSPICIOUS FOR LYMPHOVASCULAR INVASION.  Size of invasive carcinoma: 4 mm in this sample  Histologic grade of invasive carcinoma: Grade 3                       Glandular/tubular differentiation score: 3                       Nuclear pleomorphism score: 3                       Mitotic rate score: 2                       Total score: 8  C.  BREAST WITH CALCIFICATIONS, LEFT MEDIAL UPPER OUTER QUADRANT;  STEREOTACTIC BIOPSY (COIL-SHAPED CLIP):  - COLUMNAR CELL CHANGE WITH ASSOCIATED CALCIFICATIONS.  - NEGATIVE FOR ATYPIA AND MALIGNANCY. BIOPSY- ER > 90%; PR-NEG; Her-2 NEG -----------------------------------------------------------------  TNM Descriptors: M (multifocal)  pT1a  Regional Lymph Nodes Modifier: SN  pN0  pM - Not applicable   #  HIGH RISK ONCOTYPE- RS score =35; which unfortunately translates to up to 23% risk of distant recurrence at 9 years.  Benefit from chemotherapy is more than 15%.  # JUne 2nd 2022- START TAX-CYTOXAN q3w x4. S/p RT  #October 2022-anastrozole; # FEB 2022-plan adjuvant Zometa every 6 months 3-5 years   Carcinoma of upper-outer quadrant of left breast in female, estrogen receptor positive ([place])  06/06/2020 Initial Diagnosis   Carcinoma of  upper-outer quadrant of left breast in female, estrogen receptor positive ([place])   06/06/2020 Cancer Staging   Staging form: Breast, AJCC 8th Edition - Clinical: Stage IB (cT1b, cN0, cM0, G3, ER+, PR-, HER2-) - Signed by Cammie Sickle, MD on 06/06/2020 Stage prefix: Initial diagnosis   09/16/2020 -  Chemotherapy    Patient is on Treatment Plan: BREAST TC Q21D         HISTORY OF PRESENTING ILLNESS: [place] 66 y.o.  female with stage I breast cancer ER/PR positive HER2/neu negative with high risk Oncotype score status post lumpectomy, XRT and Taxotere/Cytoxan every 3 weeks x 4 rounds.  She was started on anastrozole in October 2022 and appears to be tolerating well.  She was last seen in clinic by Dr. Rogue Bussing in January 2023.  At that time she was doing well and tolerating anastrozole.  Since her last visit she was evaluated by Dr. Donella Stade for follow-up and denied any new concerns.  Review of Systems  Constitutional: Negative.  Negative for chills, fever, malaise/fatigue and weight loss.  HENT:  Negative for congestion, ear pain and tinnitus.   Eyes: Negative.  Negative for blurred vision and double vision.  Respiratory: Negative.  Negative for cough, sputum production and shortness of breath.   Cardiovascular: Negative.  Negative for chest pain, palpitations and leg swelling.  Gastrointestinal: Negative.  Negative for abdominal pain, constipation, diarrhea, nausea and vomiting.  Genitourinary:  Negative for dysuria, frequency and urgency.  Musculoskeletal:  Negative for back pain and falls.  Skin: Negative.  Negative for rash.  Neurological: Negative.  Negative for weakness and headaches.  Endo/Heme/Allergies: Negative.  Does not bruise/bleed easily.  Psychiatric/Behavioral: Negative.  Negative for depression. The patient is not nervous/anxious and does not have insomnia.      MEDICAL HISTORY:  Past Medical History:  Diagnosis Date   Carcinoma of upper-outer quadrant  of left breast in female, estrogen receptor positive ([place]) 06/06/2020   Diabetes mellitus without complication (HCC)    diet controlled   Family history of breast cancer    Family history of ovarian cancer    GERD (gastroesophageal reflux disease)    History of hiatal hernia    Hyperlipidemia    Hypertension    Migraines     SURGICAL HISTORY: Past Surgical History:  Procedure Laterality Date   BREAST BIOPSY Left 05/29/2020   affirm bx "X" clp-invasive   BREAST BIOPSY Left 05/29/2020   Affirm bx"Coil" clip-invasive   BREAST BIOPSY Right 2001   excisional biopsy   BREAST EXCISIONAL BIOPSY Right 06/19/1999   neg   BREAST LUMPECTOMY     BREAST LUMPECTOMY,RADIO FREQ LOCALIZER,AXILLARY SENTINEL LYMPH NODE BIOPSY Left 07/12/2020  Procedure: BREAST Bowman SENTINEL LYMPH NODE BIOPSY;  Surgeon: Fredirick Maudlin, MD;  Location: [facility] ORS;  Service: General;  Laterality: Left;   COLONOSCOPY  02/15/2009   EXCISION OF BREAST BIOPSY Left 07/12/2020   tag placement 07/10/20 vison tag [id number]; hourglass tag [id number]; cylinder tag [id number]   TUBAL LIGATION      SOCIAL HISTORY: Social History   Socioeconomic History   Marital status: Married    Spouse name: Darrell   Number of children: Not on file   Years of education: Not on file   Highest education level: Not on file  Occupational History   Not on file  Tobacco Use   Smoking status: Former    Years: 20.00    Types: Cigarettes    Quit date: 2020    Years since quitting: 3.5   Smokeless tobacco: Never  Vaping Use   Vaping Use: Never used  Substance and Sexual Activity   Alcohol use: Never   Drug use: Never   Sexual activity: Not on file  Other Topics Concern   Not on file  Social History Narrative   Not on file   Social Determinants of Health   Financial Resource Strain: Not on file  Food Insecurity: Not on file  Transportation Needs: Not on file  Physical Activity: Not on file  Stress: Not on file   Social Connections: Not on file  Intimate Partner Violence: Not on file    FAMILY HISTORY: Family History  Problem Relation Age of Onset   Ovarian cancer Sister 62   Breast cancer Maternal Grandmother        dx 67s   Breast cancer Paternal Grandmother     ALLERGIES:  has No Known Allergies.  MEDICATIONS:  Current Outpatient Medications  Medication Sig Dispense Refill   anastrozole (ARIMIDEX) 1 MG tablet TAKE 1 TABLET DAILY 90 tablet 1   aspirin-acetaminophen-caffeine (EXCEDRIN MIGRAINE) 250-250-65 MG tablet Take 1 tablet by mouth daily as needed (headaches).     atorvastatin (LIPITOR) 10 MG tablet Take 10 mg by mouth daily.     Cholecalciferol 25 MCG (1000 UT) tablet Take 1,000 Units by mouth daily.     magnesium oxide (MAG-OX) 400 MG tablet Take 400 mg by mouth daily.     Omega-3 Fatty Acids (FISH OIL) 1000 MG CAPS Take 1,000 mg by mouth daily.     omeprazole (PRILOSEC) 20 MG capsule Take 20 mg by mouth daily.     valsartan (DIOVAN) 80 MG tablet Take 80 mg by mouth daily.     No current facility-administered medications for this visit.      Marland Kitchen  PHYSICAL EXAMINATION: ECOG PERFORMANCE STATUS: 0 - Asymptomatic  There were no vitals filed for this visit.  There were no vitals filed for this visit.   Physical Exam Constitutional:      Appearance: Normal appearance.  HENT:     Head: Normocephalic and atraumatic.  Eyes:     Pupils: Pupils are equal, round, and reactive to light.  Cardiovascular:     Rate and Rhythm: Normal rate and regular rhythm.     Heart sounds: Normal heart sounds. No murmur heard. Pulmonary:     Effort: Pulmonary effort is normal.     Breath sounds: Normal breath sounds. No wheezing.  Abdominal:     General: Bowel sounds are normal. There is no distension.     Palpations: Abdomen is soft.     Tenderness: There is no abdominal tenderness.  Musculoskeletal:  General: Normal range of motion.     Cervical back: Normal range of motion.   Skin:    General: Skin is warm and dry.     Findings: No rash.  Neurological:     Mental Status: She is alert and oriented to person, place, and time.     Gait: Gait is intact.  Psychiatric:        Mood and Affect: Mood and affect normal.        Cognition and Memory: Memory normal.        Judgment: Judgment normal.      LABORATORY DATA:  I have reviewed the data as listed Lab Results  Component Value Date   WBC 5.9 10/31/2021   HGB 12.9 10/31/2021   HCT 38.6 10/31/2021   MCV 91.9 10/31/2021   PLT 202 10/31/2021   Recent Labs    11/18/20 0858 11/28/20 1047 12/18/20 0900 05/01/21 1036  NA 135 137 137 138  K 3.7 4.0 4.3 4.3  CL 103 103 107 107  CO2 _0 GLUCOSE 99 108* 107* 110*  BUN _1 CREATININE 0.66 0.86 0.64 0.80  CALCIUM 9.0 8.8* 8.9 9.2  GFRNONAA >60 >60 >60 >60  PROT 6.7  --  6.3* 6.8  ALBUMIN 4.1  --  4.1 4.5  AST 14*  --  14* 15  ALT 12  --  11 16  ALKPHOS 66  --  61 68  BILITOT 0.7  --  0.4 0.4     RADIOGRAPHIC STUDIES: I have personally reviewed the radiological images as listed and agreed with the findings in the report. No results found.  ASSESSMENT & PLAN:  Left breast cancer stage I- High risk Oncotype score.  Status postlumpectomy, 4 cycles of Cytoxan and Taxotere and radiation followed by 5 years of anastrozole which she started in October 2022.  Appears to be tolerating anastrozole well.  Reviewed labs with patient which are acceptable for treatment.  Proceed with Zometa.  Osteopenia- Baseline bone density showed a T score of -2.1.  She was started on Zometa every 6 months.  Last dose was 05/01/2021.  She will be due for another Zometa injection today.   Disposition- Proceed with Zometa today.  Continue anastrozole.  Return to clinic and 6 months for follow-up with Dr. Rogue Bussing with lab work and next dose of Zometa.  I spent 25 minutes dedicated to the care of this patient (face-to-face and non-face-to-face) on the date  of the encounter to include what is described in the assessment and plan.   No problem-specific Assessment & Plan notes found for this encounter.   All questions were answered. The patient/family knows to call the clinic with any problems, questions or concerns.   Jacquelin Hawking, NP 10/31/2021 12:54 PM

## 2021-11-03 ENCOUNTER — Other Ambulatory Visit: Payer: Self-pay

## 2021-11-20 ENCOUNTER — Ambulatory Visit
Admission: RE | Admit: 2021-11-20 | Discharge: 2021-11-20 | Disposition: A | Discharge status: Home / Self Care | Payer: Medicare HMO | Source: Ambulatory Visit | Attending: Oncology | Admitting: Oncology

## 2021-11-20 DIAGNOSIS — Z170 Estrogen receptor positive status [ER+]: Secondary | ICD-10-CM | POA: Diagnosis present

## 2021-11-20 DIAGNOSIS — C50412 Malignant neoplasm of upper-outer quadrant of left female breast: Secondary | ICD-10-CM

## 2022-01-11 ENCOUNTER — Other Ambulatory Visit: Payer: Self-pay | Admitting: Internal Medicine

## 2022-01-12 ENCOUNTER — Ambulatory Visit
Admission: RE | Admit: 2022-01-12 | Discharge: 2022-01-12 | Disposition: A | Discharge status: Home / Self Care | Payer: Medicare HMO | Source: Ambulatory Visit | Attending: Radiation Oncology | Admitting: Radiation Oncology

## 2022-01-12 ENCOUNTER — Encounter: Payer: Self-pay | Admitting: Internal Medicine

## 2022-01-12 ENCOUNTER — Ambulatory Visit
Admission: RE | Admit: 2022-01-12 | Discharge: 2022-01-12 | Disposition: A | Discharge status: Home / Self Care | Payer: Medicare HMO | Source: Ambulatory Visit | Attending: Oncology | Admitting: Oncology

## 2022-01-12 VITALS — BP 150/74 | HR 68 | Temp 97.8°F | Resp 16 | Ht 64.0 in | Wt 152.8 lb

## 2022-01-12 DIAGNOSIS — M8589 Other specified disorders of bone density and structure, multiple sites: Secondary | ICD-10-CM | POA: Diagnosis not present

## 2022-01-12 DIAGNOSIS — Z923 Personal history of irradiation: Secondary | ICD-10-CM | POA: Insufficient documentation

## 2022-01-12 DIAGNOSIS — C50412 Malignant neoplasm of upper-outer quadrant of left female breast: Secondary | ICD-10-CM | POA: Insufficient documentation

## 2022-01-12 DIAGNOSIS — M8580 Other specified disorders of bone density and structure, unspecified site: Secondary | ICD-10-CM | POA: Insufficient documentation

## 2022-01-12 DIAGNOSIS — Z9221 Personal history of antineoplastic chemotherapy: Secondary | ICD-10-CM | POA: Diagnosis not present

## 2022-01-12 DIAGNOSIS — Z79811 Long term (current) use of aromatase inhibitors: Secondary | ICD-10-CM | POA: Diagnosis not present

## 2022-01-12 DIAGNOSIS — Z170 Estrogen receptor positive status [ER+]: Secondary | ICD-10-CM | POA: Insufficient documentation

## 2022-01-12 DIAGNOSIS — Z853 Personal history of malignant neoplasm of breast: Secondary | ICD-10-CM | POA: Diagnosis not present

## 2022-01-12 DIAGNOSIS — Z780 Asymptomatic menopausal state: Secondary | ICD-10-CM | POA: Diagnosis not present

## 2022-01-12 DIAGNOSIS — Z13820 Encounter for screening for osteoporosis: Secondary | ICD-10-CM | POA: Diagnosis present

## 2022-01-12 NOTE — Progress Notes (Signed)
Radiation Oncology Follow up Note  Name: Candice Bowers   Date:   01/12/2022 MRN:  [id number] DOB: [birth year]    This 66 y.o. female presents to the clinic today for 1 year follow-up status post whole breast radiation to her left breast for stage Ib (T1b N0 M0) ER positive PR negative invasive mammary carcinoma.  REFERRING PROVIDER: Sofie Hartigan, MD  HPI: Patient is a 66 year old female now at 1 year having completed whole breast radiation to her left breast for stage Ib ER positive invasive mammary carcinoma.  Seen today in routine follow-up she is doing well.  She specifically denies breast tenderness cough or bone pain.  She had mammograms back in August.  Which I have reviewed were BI-RADS 2 benign.  She is currently on Arimidex tolerating it well without side effect.  COMPLICATIONS OF TREATMENT: none  FOLLOW UP COMPLIANCE: keeps appointments   PHYSICAL EXAM:  BP (!) 150/74   Pulse 68   Temp 97.8 F (36.6 C)   Resp 16   Ht '5\' 4"'$  (1.626 m)   Wt 152 lb 12.8 oz (69.3 kg)   BMI 26.23 kg/m  Lungs are clear to A&P cardiac examination essentially unremarkable with regular rate and rhythm. No dominant mass or nodularity is noted in either breast in 2 positions examined. Incision is well-healed. No axillary or supraclavicular adenopathy is appreciated. Cosmetic result is excellent.  Well-developed well-nourished patient in NAD. HEENT reveals PERLA, EOMI, discs not visualized.  Oral cavity is clear. No oral mucosal lesions are identified. Neck is clear without evidence of cervical or supraclavicular adenopathy. Lungs are clear to A&P. Cardiac examination is essentially unremarkable with regular rate and rhythm without murmur rub or thrill. Abdomen is benign with no organomegaly or masses noted. Motor sensory and DTR levels are equal and symmetric in the upper and lower extremities. Cranial nerves II through XII are grossly intact. Proprioception is intact. No peripheral adenopathy or edema  is identified. No motor or sensory levels are noted. Crude visual fields are within normal range.  RADIOLOGY RESULTS: Mammograms reviewed compatible with above-stated findings  PLAN: Present time patient is doing well 1 year out with no evidence of disease.  I am pleased with her overall progress.  Of asked to see her back in 1 year for follow-up.  She continues on Arimidex without side effect.  Patient is to call with any concerns.  I would like to take this opportunity to thank you for allowing me to participate in the care of your patient.Noreene Filbert, MD

## 2022-05-01 ENCOUNTER — Other Ambulatory Visit: Payer: Self-pay | Admitting: *Deleted

## 2022-05-01 DIAGNOSIS — C50412 Malignant neoplasm of upper-outer quadrant of left female breast: Secondary | ICD-10-CM

## 2022-05-04 ENCOUNTER — Encounter: Payer: Self-pay | Admitting: Internal Medicine

## 2022-05-04 ENCOUNTER — Inpatient Hospital Stay (HOSPITAL_BASED_OUTPATIENT_CLINIC_OR_DEPARTMENT_OTHER): Payer: Medicare HMO | Admitting: Internal Medicine

## 2022-05-04 ENCOUNTER — Inpatient Hospital Stay: Payer: Medicare HMO

## 2022-05-04 ENCOUNTER — Inpatient Hospital Stay: Payer: Medicare HMO | Attending: Internal Medicine

## 2022-05-04 VITALS — BP 132/81 | HR 80 | Temp 97.2°F | Resp 16 | Wt 156.2 lb

## 2022-05-04 DIAGNOSIS — M2550 Pain in unspecified joint: Secondary | ICD-10-CM | POA: Diagnosis not present

## 2022-05-04 DIAGNOSIS — M791 Myalgia, unspecified site: Secondary | ICD-10-CM | POA: Diagnosis not present

## 2022-05-04 DIAGNOSIS — Z79811 Long term (current) use of aromatase inhibitors: Secondary | ICD-10-CM | POA: Diagnosis not present

## 2022-05-04 DIAGNOSIS — Z8041 Family history of malignant neoplasm of ovary: Secondary | ICD-10-CM | POA: Diagnosis not present

## 2022-05-04 DIAGNOSIS — R232 Flushing: Secondary | ICD-10-CM | POA: Insufficient documentation

## 2022-05-04 DIAGNOSIS — Z79899 Other long term (current) drug therapy: Secondary | ICD-10-CM | POA: Insufficient documentation

## 2022-05-04 DIAGNOSIS — Z170 Estrogen receptor positive status [ER+]: Secondary | ICD-10-CM

## 2022-05-04 DIAGNOSIS — Z803 Family history of malignant neoplasm of breast: Secondary | ICD-10-CM | POA: Diagnosis not present

## 2022-05-04 DIAGNOSIS — C50412 Malignant neoplasm of upper-outer quadrant of left female breast: Secondary | ICD-10-CM

## 2022-05-04 DIAGNOSIS — M8580 Other specified disorders of bone density and structure, unspecified site: Secondary | ICD-10-CM | POA: Insufficient documentation

## 2022-05-04 DIAGNOSIS — Z87891 Personal history of nicotine dependence: Secondary | ICD-10-CM | POA: Diagnosis not present

## 2022-05-04 LAB — COMPREHENSIVE METABOLIC PANEL
ALT: 22 U/L (ref 0–44)
AST: 21 U/L (ref 15–41)
Albumin: 4.3 g/dL (ref 3.5–5.0)
Alkaline Phosphatase: 47 U/L (ref 38–126)
Anion gap: 9 (ref 5–15)
BUN: 23 mg/dL (ref 8–23)
CO2: 25 mmol/L (ref 22–32)
Calcium: 9 mg/dL (ref 8.9–10.3)
Chloride: 104 mmol/L (ref 98–111)
Creatinine, Ser: 0.97 mg/dL (ref 0.44–1.00)
GFR, Estimated: >60 mL/min (ref >60)
Glucose, Bld: 121 mg/dL — ABNORMAL HIGH (ref 70–99)
Potassium: 3.9 mmol/L (ref 3.5–5.1)
Sodium: 138 mmol/L (ref 135–145)
Total Bilirubin: 0.3 mg/dL (ref 0.3–1.2)
Total Protein: 7.1 g/dL (ref 6.5–8.1)

## 2022-05-04 LAB — CBC WITH DIFFERENTIAL/PLATELET
Abs Immature Granulocytes: 0.02 10*3/uL (ref 0.00–0.07)
Basophils Absolute: 0.1 10*3/uL (ref 0.0–0.1)
Basophils Relative: 1 %
Eosinophils Absolute: 0 10*3/uL (ref 0.0–0.5)
Eosinophils Relative: 0 %
HCT: 38.9 % (ref 36.0–46.0)
Hemoglobin: 13.5 g/dL (ref 12.0–15.0)
Immature Granulocytes: 0 %
Lymphocytes Relative: 27 %
Lymphs Abs: 1.8 10*3/uL (ref 0.7–4.0)
MCH: 30.5 pg (ref 26.0–34.0)
MCHC: 34.7 g/dL (ref 30.0–36.0)
MCV: 88 fL (ref 80.0–100.0)
Monocytes Absolute: 0.4 10*3/uL (ref 0.1–1.0)
Monocytes Relative: 6 %
Neutro Abs: 4.4 10*3/uL (ref 1.7–7.7)
Neutrophils Relative %: 66 %
Platelets: 207 10*3/uL (ref 150–400)
RBC: 4.42 MIL/uL (ref 3.87–5.11)
RDW: 12.9 % (ref 11.5–15.5)
WBC: 6.7 10*3/uL (ref 4.0–10.5)
nRBC: 0 % (ref 0.0–0.2)

## 2022-05-04 MED ORDER — SODIUM CHLORIDE 0.9 % IV SOLN
Freq: Once | INTRAVENOUS | Status: AC
  Filled 2022-05-04: qty 250

## 2022-05-04 MED ORDER — ZOLEDRONIC ACID 4 MG/100ML IV SOLN
4.0000 mg | Freq: Once | INTRAVENOUS | Status: AC
  Administered 2022-05-04: 4 mg via INTRAVENOUS
  Filled 2022-05-04: qty 100

## 2022-05-04 NOTE — Patient Instructions (Signed)

## 2022-05-04 NOTE — Progress Notes (Signed)
Patient denies new problems/concerns today.   °

## 2022-05-04 NOTE — Assessment & Plan Note (Signed)
#  Stage I breast cancer- pT1a [5 mm; 4 mm] grade 3; pN0- ER positive PR negative; HER2 negative. HIGH RISK ONCOTYPE- RS score =35. Currently on anastrazole [oct 2022]; continue  with anastrozole+ adjuvant zometa- tolerating well. Mammo BIL- AUg 2023- WNL.   #September 2022-bone density osteopenia T score -2.1;on adjuvant Zometa [JAN 2023] q 6 m x3 years.  continue exercises; vit D + ca+vit D. Discussed the potential benefits and/side effects  Including but not limited to Osteonecrosis of jaw/ hypocalcemia.  Patient currently exercises/walks 3 miles a day.  Proceed with Zometa today.   # DISPOSITION: # Zometa today # follow up in 6 months- MD; labs- cbc/cmp;Zometa  Dr.B

## 2022-05-04 NOTE — Progress Notes (Signed)
[facility] NOTE  Patient Care Team: Sofie Hartigan, MD as PCP - General (Family Medicine) Theodore Demark, RN (Inactive) as Oncology Nurse Navigator Cammie Sickle, MD as Consulting Physician (Hematology and Oncology) Noreene Filbert, MD as Consulting Physician (Radiation Oncology)  CHIEF COMPLAINTS/PURPOSE OF CONSULTATION: Breast cancer  #  Oncology History Overview Note  # JAN 2022-  0.5 cm irregular UPPER-OUTER LEFT breast mass with calcifications extending 1.5 cm anteroinferiorly, suspicious for malignancy. Ultrasound-guided biopsy of the mass is recommended. Stereotactic guided biopsy of the anterior/inferior most calcifications extending from this mass is also recommended. 2. Indeterminate 0.4 cm group of UPPER-OUTER LEFT breast calcifications, located 2.5 cm from the irregular mass above. 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 This was found to be concordant by Dr. Zerita Boers.   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 This was found to be concordant by Dr. Zerita Boers.   Pathology revealed COLUMNAR CELL CHANGE WITH ASSOCIATED CALCIFICATIONS, NEGATIVE FOR ATYPIA AND MALIGNANCY of the LEFT breast calcifications, medial upper outer quadrant, coil clip. This was found to be concordant by Dr. Zerita Boers.   Of note, the area of invasive mammary carcinoma spans at least 1.9 cm mammographically.  # BREAST, LEFT 2:00 7 CM FN; ULTRASOUND-GUIDED BIOPSY (VISION CLIP):  - INVASIVE MAMMARY CARCINOMA, NO  SPECIAL TYPE.  Size of invasive carcinoma: 5 mm in this sample  Histologic grade of invasive carcinoma: Grade 3                       Glandular/tubular differentiation score: 3                       Nuclear pleomorphism score: 3                       Mitotic rate score: 2                       Total score: 8  Ductal carcinoma in situ: Present, high-grade with calcification  Lymphovascular invasion: Not identified   B.  BREAST WITH CALCIFICATIONS, LEFT LATERAL UPPER OUTER QUADRANT;  STEREOTACTIC BIOPSY (X SHAPED CLIP):  - INVASIVE MAMMARY CARCINOMA, NO SPECIAL TYPE.  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  - CALCIFICATIONS ASSOCIATED WITH DCIS AND SCLEROSING ADENOSIS.  - AREAS SUSPICIOUS FOR LYMPHOVASCULAR INVASION.  Size of invasive carcinoma: 4 mm in this sample  Histologic grade of invasive carcinoma: Grade 3                       Glandular/tubular differentiation score: 3                       Nuclear pleomorphism score: 3                       Mitotic rate score: 2                       Total score: 8  C.  BREAST WITH CALCIFICATIONS, LEFT MEDIAL UPPER OUTER QUADRANT;  STEREOTACTIC BIOPSY (COIL-SHAPED CLIP):  - COLUMNAR CELL CHANGE WITH ASSOCIATED CALCIFICATIONS.  - NEGATIVE FOR ATYPIA AND MALIGNANCY. BIOPSY- ER > 90%; PR-NEG; Her-2 NEG -----------------------------------------------------------------  TNM Descriptors: M (multifocal)  pT1a  Regional Lymph Nodes Modifier: SN  pN0  pM - Not applicable   #  HIGH RISK ONCOTYPE- RS score =35; which unfortunately translates to up to 23% risk of distant recurrence at 9 years.  Benefit from chemotherapy is more than 15%.  # JUne 2nd 2022- START TAX-CYTOXAN q3w x4. S/p RT  #October 2022-anastrozole; # FEB 2022-plan adjuvant Zometa every 6 months 3-5 years   Carcinoma of upper-outer quadrant of left breast in female, estrogen receptor positive ([place])  06/06/2020 Initial Diagnosis   Carcinoma of  upper-outer quadrant of left breast in female, estrogen receptor positive ([place])   06/06/2020 Cancer Staging   Staging form: Breast, AJCC 8th Edition - Clinical: Stage IB (cT1b, cN0, cM0, G3, ER+, PR-, HER2-) - Signed by Cammie Sickle, MD on 06/06/2020 Stage prefix: Initial diagnosis   09/16/2020 - 11/19/2020 Chemotherapy   Patient is on Treatment Plan : BREAST TC q21d      HISTORY OF PRESENTING ILLNESS: Alone.  Ambulating independently.  [place] 67 y.o.  female newly diagnosed stage I breast cancer ER/PR positive HER2 negative -currently on adjuvant anastrozole is here for follow-up.  Patient denies any worsening joint pains.  Denies any worsening bone pain.  mild hot flashes.  Review of Systems  Constitutional:  Negative for chills, diaphoresis, fever, malaise/fatigue and weight loss.  HENT:  Negative for nosebleeds and sore throat.   Eyes:  Negative for double vision.  Respiratory:  Negative for cough, hemoptysis, sputum production, shortness of breath and wheezing.   Cardiovascular:  Negative for chest pain, palpitations, orthopnea and leg swelling.  Gastrointestinal:  Negative for abdominal pain, blood in stool, constipation, heartburn, melena, nausea and vomiting.  Genitourinary:  Negative for dysuria, frequency and urgency.  Musculoskeletal:  Positive for joint pain and myalgias. Negative for back pain.  Skin: Negative.  Negative for itching and rash.  Neurological:  Positive for tingling. Negative for dizziness, focal weakness, weakness and headaches.  Endo/Heme/Allergies:  Does not bruise/bleed easily.  Psychiatric/Behavioral:  Negative for depression. The patient is not nervous/anxious and does not have insomnia.      MEDICAL HISTORY:  Past Medical History:  Diagnosis Date   Carcinoma of upper-outer quadrant of left breast in female, estrogen receptor positive ([place]) 06/06/2020   Diabetes mellitus without complication ([place])    diet controlled   Family history of  breast cancer    Family history of ovarian cancer    GERD (gastroesophageal reflux disease)    History of hiatal hernia    Hyperlipidemia    Hypertension    Migraines     SURGICAL HISTORY: Past Surgical History:  Procedure Laterality Date   BREAST BIOPSY Left 05/29/2020   affirm bx "X" clp-invasive   BREAST BIOPSY Left 05/29/2020   Affirm bx"Coil" clip-invasive   BREAST BIOPSY Right 2001   excisional biopsy   BREAST EXCISIONAL BIOPSY Right 06/19/1999   neg   BREAST LUMPECTOMY     BREAST LUMPECTOMY,RADIO FREQ LOCALIZER,AXILLARY SENTINEL LYMPH NODE BIOPSY Left 07/12/2020   Procedure: BREAST LUMPECTOMY,RADIO FREQ LOCALIZER,AXILLARY SENTINEL LYMPH NODE BIOPSY;  Surgeon: Fredirick Maudlin, MD;  Location: [facility] ORS;  Service: General;  Laterality: Left;   COLONOSCOPY  02/15/2009   EXCISION OF BREAST BIOPSY Left 07/12/2020  tag placement 07/10/20 vison tag [id number]; hourglass tag [phone]; cylinder tag [id number]   TUBAL LIGATION      SOCIAL HISTORY: Social History   Socioeconomic History   Marital status: Married    Spouse name: Darrell   Number of children: Not on file   Years of education: Not on file   Highest education level: Not on file  Occupational History   Not on file  Tobacco Use   Smoking status: Former    Years: 20.00    Types: Cigarettes    Quit date: 2020    Years since quitting: 4.0   Smokeless tobacco: Never  Vaping Use   Vaping Use: Never used  Substance and Sexual Activity   Alcohol use: Never   Drug use: Never   Sexual activity: Not on file  Other Topics Concern   Not on file  Social History Narrative   Not on file   Social Determinants of Health   Financial Resource Strain: Not on file  Food Insecurity: Not on file  Transportation Needs: Not on file  Physical Activity: Not on file  Stress: Not on file  Social Connections: Not on file  Intimate Partner Violence: Not on file    FAMILY HISTORY: Family History  Problem Relation Age of Onset   Ovarian  cancer Sister 33   Breast cancer Maternal Grandmother        dx 35s   Breast cancer Paternal Grandmother     ALLERGIES:  has No Known Allergies.  MEDICATIONS:  Current Outpatient Medications  Medication Sig Dispense Refill   anastrozole (ARIMIDEX) 1 MG tablet TAKE 1 TABLET DAILY 90 tablet 1   aspirin-acetaminophen-caffeine (EXCEDRIN MIGRAINE) 250-250-65 MG tablet Take 1 tablet by mouth daily as needed (headaches).     atorvastatin (LIPITOR) 10 MG tablet Take 10 mg by mouth daily.     Cholecalciferol 25 MCG (1000 UT) tablet Take 1,000 Units by mouth daily.     magnesium oxide (MAG-OX) 400 MG tablet Take 400 mg by mouth daily.     Omega-3 Fatty Acids (FISH OIL) 1000 MG CAPS Take 1,000 mg by mouth daily.     omeprazole (PRILOSEC) 20 MG capsule Take 20 mg by mouth daily.     valsartan (DIOVAN) 80 MG tablet Take 80 mg by mouth daily.     No current facility-administered medications for this visit.      Marland Kitchen  PHYSICAL EXAMINATION: ECOG PERFORMANCE STATUS: 0 - Asymptomatic  Vitals:   05/04/22 1300  BP: 132/81  Pulse: 80  Resp: 16  Temp: (!) 97.2 F (36.2 C)   Filed Weights   05/04/22 1300  Weight: 156 lb 3.2 oz (70.9 kg)    Physical Exam HENT:     Head: Normocephalic and atraumatic.     Mouth/Throat:     Pharynx: No oropharyngeal exudate.  Eyes:     Pupils: Pupils are equal, round, and reactive to light.  Cardiovascular:     Rate and Rhythm: Normal rate and regular rhythm.  Pulmonary:     Effort: No respiratory distress.     Breath sounds: No wheezing.  Abdominal:     General: Bowel sounds are normal. There is no distension.     Palpations: Abdomen is soft. There is no mass.     Tenderness: There is no abdominal tenderness. There is no guarding or rebound.  Musculoskeletal:        General: No tenderness. Normal range of motion.     Cervical back: Normal range  of motion and neck supple.  Skin:    General: Skin is warm.  Neurological:     Mental Status: She is  alert and oriented to person, place, and time.  Psychiatric:        Mood and Affect: Affect normal.      LABORATORY DATA:  I have reviewed the data as listed Lab Results  Component Value Date   WBC 6.7 05/04/2022   HGB 13.5 05/04/2022   HCT 38.9 05/04/2022   MCV 88.0 05/04/2022   PLT 207 05/04/2022   Recent Labs    10/31/21 1242 05/04/22 1306  NA 139 138  K 4.0 3.9  CL 108 104  CO2 25 25  GLUCOSE 96 121*  BUN 20 23  CREATININE 0.81 0.97  CALCIUM 9.5 9.0  GFRNONAA >60 >60  PROT 7.0 7.1  ALBUMIN 4.2 4.3  AST 22 21  ALT 24 22  ALKPHOS 59 47  BILITOT 0.2* 0.3    RADIOGRAPHIC STUDIES: I have personally reviewed the radiological images as listed and agreed with the findings in the report. No results found.  ASSESSMENT & PLAN:   Carcinoma of upper-outer quadrant of left breast in female, estrogen receptor positive ([place]) #Stage I breast cancer- pT1a [5 mm; 4 mm] grade 3; pN0- ER positive PR negative; HER2 negative. HIGH RISK ONCOTYPE- RS score =35. Currently on anastrazole [oct 2022]; continue  with anastrozole+ adjuvant zometa- tolerating well. Mammo BIL- AUg 2023- WNL.   #September 2022-bone density osteopenia T score -2.1;on adjuvant Zometa [JAN 2023] q 6 m x3 years.  continue exercises; vit D + ca+vit D. Discussed the potential benefits and/side effects  Including but not limited to Osteonecrosis of jaw/ hypocalcemia.  Patient currently exercises/walks 3 miles a day.  Proceed with Zometa today.   # DISPOSITION: # Zometa today # follow up in 6 months- MD; labs- cbc/cmp;Zometa  Dr.B   All questions were answered. The patient/family knows to call the clinic with any problems, questions or concerns.   Cammie Sickle, MD 05/04/2022 1:55 PM

## 2022-07-16 ENCOUNTER — Other Ambulatory Visit: Payer: Self-pay | Admitting: Internal Medicine

## 2022-10-07 ENCOUNTER — Encounter: Payer: Self-pay | Admitting: Family Medicine

## 2022-10-08 ENCOUNTER — Other Ambulatory Visit: Payer: Self-pay | Admitting: Family Medicine

## 2022-10-08 DIAGNOSIS — Z853 Personal history of malignant neoplasm of breast: Secondary | ICD-10-CM

## 2022-11-02 ENCOUNTER — Inpatient Hospital Stay: Payer: Medicare HMO

## 2022-11-02 ENCOUNTER — Inpatient Hospital Stay: Payer: Medicare HMO | Attending: Internal Medicine

## 2022-11-02 ENCOUNTER — Encounter: Payer: Self-pay | Admitting: Internal Medicine

## 2022-11-02 ENCOUNTER — Inpatient Hospital Stay (HOSPITAL_BASED_OUTPATIENT_CLINIC_OR_DEPARTMENT_OTHER): Payer: Medicare HMO | Admitting: Internal Medicine

## 2022-11-02 DIAGNOSIS — Z87891 Personal history of nicotine dependence: Secondary | ICD-10-CM | POA: Diagnosis not present

## 2022-11-02 DIAGNOSIS — Z170 Estrogen receptor positive status [ER+]: Secondary | ICD-10-CM | POA: Insufficient documentation

## 2022-11-02 DIAGNOSIS — C50412 Malignant neoplasm of upper-outer quadrant of left female breast: Secondary | ICD-10-CM | POA: Insufficient documentation

## 2022-11-02 DIAGNOSIS — Z79811 Long term (current) use of aromatase inhibitors: Secondary | ICD-10-CM | POA: Diagnosis not present

## 2022-11-02 DIAGNOSIS — Z8041 Family history of malignant neoplasm of ovary: Secondary | ICD-10-CM | POA: Insufficient documentation

## 2022-11-02 DIAGNOSIS — Z803 Family history of malignant neoplasm of breast: Secondary | ICD-10-CM | POA: Insufficient documentation

## 2022-11-02 DIAGNOSIS — R232 Flushing: Secondary | ICD-10-CM | POA: Insufficient documentation

## 2022-11-02 DIAGNOSIS — Z79899 Other long term (current) drug therapy: Secondary | ICD-10-CM | POA: Insufficient documentation

## 2022-11-02 LAB — COMPREHENSIVE METABOLIC PANEL
ALT: 23 U/L (ref 0–44)
AST: 19 U/L (ref 15–41)
Albumin: 4 g/dL (ref 3.5–5.0)
Alkaline Phosphatase: 50 U/L (ref 38–126)
Anion gap: 9 (ref 5–15)
BUN: 19 mg/dL (ref 8–23)
CO2: 23 mmol/L (ref 22–32)
Calcium: 9 mg/dL (ref 8.9–10.3)
Chloride: 106 mmol/L (ref 98–111)
Creatinine, Ser: 0.89 mg/dL (ref 0.44–1.00)
GFR, Estimated: >60 mL/min (ref >60)
Glucose, Bld: 118 mg/dL — ABNORMAL HIGH (ref 70–99)
Potassium: 3.5 mmol/L (ref 3.5–5.1)
Sodium: 138 mmol/L (ref 135–145)
Total Bilirubin: 0.4 mg/dL (ref 0.3–1.2)
Total Protein: 6.6 g/dL (ref 6.5–8.1)

## 2022-11-02 LAB — CBC WITH DIFFERENTIAL/PLATELET
Abs Immature Granulocytes: 0.02 10*3/uL (ref 0.00–0.07)
Basophils Absolute: 0 10*3/uL (ref 0.0–0.1)
Basophils Relative: 1 %
Eosinophils Absolute: 0 10*3/uL (ref 0.0–0.5)
Eosinophils Relative: 1 %
HCT: 38.4 % (ref 36.0–46.0)
Hemoglobin: 12.7 g/dL (ref 12.0–15.0)
Immature Granulocytes: 0 %
Lymphocytes Relative: 29 %
Lymphs Abs: 1.6 10*3/uL (ref 0.7–4.0)
MCH: 29.7 pg (ref 26.0–34.0)
MCHC: 33.1 g/dL (ref 30.0–36.0)
MCV: 89.9 fL (ref 80.0–100.0)
Monocytes Absolute: 0.4 10*3/uL (ref 0.1–1.0)
Monocytes Relative: 8 %
Neutro Abs: 3.3 10*3/uL (ref 1.7–7.7)
Neutrophils Relative %: 61 %
Platelets: 190 10*3/uL (ref 150–400)
RBC: 4.27 MIL/uL (ref 3.87–5.11)
RDW: 12.9 % (ref 11.5–15.5)
WBC: 5.4 10*3/uL (ref 4.0–10.5)
nRBC: 0 % (ref 0.0–0.2)

## 2022-11-02 MED ORDER — ZOLEDRONIC ACID 4 MG/100ML IV SOLN
4.0000 mg | Freq: Once | INTRAVENOUS | Status: AC
  Administered 2022-11-02: 4 mg via INTRAVENOUS
  Filled 2022-11-02: qty 100

## 2022-11-02 MED ORDER — SODIUM CHLORIDE 0.9 % IV SOLN
Freq: Once | INTRAVENOUS | Status: AC
  Filled 2022-11-02: qty 250

## 2022-11-02 NOTE — Progress Notes (Signed)
[facility] CONSULT NOTE  Patient Care Team: Marina Goodell, MD as PCP - General (Family Medicine) Scarlett Presto, RN (Inactive) as Oncology Nurse Navigator Earna Coder, MD as Consulting Physician (Hematology and Oncology) Carmina Miller, MD as Consulting Physician (Radiation Oncology)  CHIEF COMPLAINTS/PURPOSE OF CONSULTATION:  Breast cancer.   Oncology History Overview Note  # JAN 2022-  0.5 cm irregular UPPER-OUTER LEFT breast mass with calcifications extending 1.5 cm anteroinferiorly, suspicious for malignancy. Ultrasound-guided biopsy of the mass is recommended. Stereotactic guided biopsy of the anterior/inferior most calcifications extending from this mass is also recommended. 2. Indeterminate 0.4 cm group of UPPER-OUTER LEFT breast calcifications, located 2.5 cm from the irregular mass above. 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 This was found to be concordant by Dr. Romona Curls.   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 This was found to be concordant by Dr. Romona Curls.   Pathology revealed COLUMNAR CELL CHANGE WITH ASSOCIATED CALCIFICATIONS, NEGATIVE FOR ATYPIA AND MALIGNANCY of the LEFT breast calcifications, medial upper outer quadrant, coil clip. This was found to be concordant by Dr. Romona Curls.   Of note, the area of invasive mammary carcinoma spans at least 1.9 cm mammographically.  # BREAST, LEFT 2:00 7 CM FN; ULTRASOUND-GUIDED BIOPSY (VISION CLIP):  - INVASIVE MAMMARY CARCINOMA, NO  SPECIAL TYPE.  Size of invasive carcinoma: 5 mm in this sample  Histologic grade of invasive carcinoma: Grade 3                       Glandular/tubular differentiation score: 3                       Nuclear pleomorphism score: 3                       Mitotic rate score: 2                       Total score: 8  Ductal carcinoma in situ: Present, high-grade with calcification  Lymphovascular invasion: Not identified   B.  BREAST WITH CALCIFICATIONS, LEFT LATERAL UPPER OUTER QUADRANT;  STEREOTACTIC BIOPSY (X SHAPED CLIP):  - INVASIVE MAMMARY CARCINOMA, NO SPECIAL TYPE.  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  - CALCIFICATIONS ASSOCIATED WITH DCIS AND SCLEROSING ADENOSIS.  - AREAS SUSPICIOUS FOR LYMPHOVASCULAR INVASION.  Size of invasive carcinoma: 4 mm in this sample  Histologic grade of invasive carcinoma: Grade 3                       Glandular/tubular differentiation score: 3                       Nuclear pleomorphism score: 3                       Mitotic rate score: 2                       Total score: 8  C.  BREAST WITH CALCIFICATIONS, LEFT MEDIAL UPPER OUTER QUADRANT;  STEREOTACTIC BIOPSY (COIL-SHAPED CLIP):  - COLUMNAR CELL CHANGE WITH ASSOCIATED CALCIFICATIONS.  - NEGATIVE FOR ATYPIA AND MALIGNANCY. BIOPSY- ER > 90%; PR-NEG; Her-2 NEG -----------------------------------------------------------------  TNM Descriptors: M (multifocal)  pT1a  Regional Lymph Nodes Modifier: SN  pN0  pM - Not applicable   #  HIGH RISK ONCOTYPE- RS score =35; which unfortunately translates to up to 23% risk of distant recurrence at 9 years.  Benefit from chemotherapy is more than 15%.  # JUne 2nd 2022- START TAX-CYTOXAN q3w x4. S/p RT  #October 2022-anastrozole; # FEB 2022-plan adjuvant Zometa every 6 months 3-5 years   Carcinoma of upper-outer quadrant of left breast in female, estrogen receptor positive ([facility])  06/06/2020 Initial Diagnosis   Carcinoma of  upper-outer quadrant of left breast in female, estrogen receptor positive ([facility])   06/06/2020 Cancer Staging   Staging form: Breast, AJCC 8th Edition - Clinical: Stage IB (cT1b, cN0, cM0, G3, ER+, PR-, HER2-) - Signed by Earna Coder, MD on 06/06/2020 Stage prefix: Initial diagnosis   09/16/2020 - 11/19/2020 Chemotherapy   Patient is on Treatment Plan : BREAST TC q21d       HISTORY OF PRESENTING ILLNESS: Patient ambulating-independently. e.  Alone.  [place] 67 y.o.  female with stage I breast cancer ER/PR positive HER2 negative -currently on adjuvant anastrozole is here for follow-up.  Patient denies any worsening joint pains.  Denies any worsening bone pain.  Mild hot flashes.   Denies any jaw pain. No dental extractions.   Review of Systems  Constitutional:  Negative for chills, diaphoresis, fever, malaise/fatigue and weight loss.  HENT:  Negative for nosebleeds and sore throat.   Eyes:  Negative for double vision.  Respiratory:  Negative for cough, hemoptysis, sputum production, shortness of breath and wheezing.   Cardiovascular:  Negative for chest pain, palpitations, orthopnea and leg swelling.  Gastrointestinal:  Negative for abdominal pain, blood in stool, constipation, diarrhea, heartburn, melena, nausea and vomiting.  Genitourinary:  Negative for dysuria, frequency and urgency.  Musculoskeletal:  Negative for back pain and joint pain.  Skin: Negative.  Negative for itching and rash.  Neurological:  Negative for dizziness, tingling, focal weakness, weakness and headaches.  Endo/Heme/Allergies:  Does not bruise/bleed easily.  Psychiatric/Behavioral:  Negative for depression. The patient is not nervous/anxious and does not have insomnia.     MEDICAL HISTORY:  Past Medical History:  Diagnosis Date   Carcinoma of upper-outer quadrant of left breast in female, estrogen receptor positive ([facility]) 06/06/2020   Diabetes mellitus without complication ([facility])    diet  controlled   Family history of breast cancer    Family history of ovarian cancer    GERD (gastroesophageal reflux disease)    History of hiatal hernia    Hyperlipidemia    Hypertension    Migraines     SURGICAL HISTORY: Past Surgical History:  Procedure Laterality Date   BREAST BIOPSY Left 05/29/2020   affirm bx "X" clp-invasive   BREAST BIOPSY Left 05/29/2020   Affirm bx"Coil" clip-invasive   BREAST BIOPSY Right 2001   excisional biopsy   BREAST EXCISIONAL BIOPSY Right 06/19/1999   neg   BREAST LUMPECTOMY     BREAST LUMPECTOMY,RADIO FREQ LOCALIZER,AXILLARY SENTINEL LYMPH NODE BIOPSY Left 07/12/2020   Procedure: BREAST LUMPECTOMY,RADIO FREQ LOCALIZER,AXILLARY SENTINEL LYMPH NODE BIOPSY;  Surgeon: Duanne Guess, MD;  Location: [facility] ORS;  Service: General;  Laterality: Left;   COLONOSCOPY  02/15/2009   EXCISION  OF BREAST BIOPSY Left 07/12/2020   tag placement 07/10/20 vison tag [id number]; hourglass tag [id number]; cylinder tag [id number]   TUBAL LIGATION      SOCIAL HISTORY: Social History   Socioeconomic History   Marital status: Married    Spouse name: Darrell   Number of children: Not on file   Years of education: Not on file   Highest education level: Not on file  Occupational History   Not on file  Tobacco Use   Smoking status: Former    Current packs/day: 0.00    Types: Cigarettes    Start date: 2000    Quit date: 2020    Years since quitting: 4.5   Smokeless tobacco: Never  Vaping Use   Vaping status: Never Used  Substance and Sexual Activity   Alcohol use: Never   Drug use: Never   Sexual activity: Not on file  Other Topics Concern   Not on file  Social History Narrative   Not on file   Social Determinants of Health   Financial Resource Strain: Not on file  Food Insecurity: Not on file  Transportation Needs: Not on file  Physical Activity: Not on file  Stress: Not on file  Social Connections: Not on file  Intimate Partner Violence: Not on file    FAMILY  HISTORY: Family History  Problem Relation Age of Onset   Ovarian cancer Sister 68   Breast cancer Maternal Grandmother        dx 30s   Breast cancer Paternal Grandmother     ALLERGIES:  has No Known Allergies.  MEDICATIONS:  Current Outpatient Medications  Medication Sig Dispense Refill   anastrozole (ARIMIDEX) 1 MG tablet TAKE 1 TABLET DAILY 90 tablet 1   aspirin-acetaminophen-caffeine (EXCEDRIN MIGRAINE) 250-250-65 MG tablet Take 1 tablet by mouth daily as needed (headaches).     atorvastatin (LIPITOR) 10 MG tablet Take 10 mg by mouth daily.     Cholecalciferol 25 MCG (1000 UT) tablet Take 1,000 Units by mouth daily.     magnesium oxide (MAG-OX) 400 MG tablet Take 400 mg by mouth daily.     Omega-3 Fatty Acids (FISH OIL) 1000 MG CAPS Take 1,000 mg by mouth daily.     omeprazole (PRILOSEC) 20 MG capsule Take 20 mg by mouth daily.     valsartan (DIOVAN) 80 MG tablet Take 80 mg by mouth daily.     No current facility-administered medications for this visit.   Facility-Administered Medications Ordered in Other Visits  Medication Dose Route Frequency Provider Last Rate Last Admin   0.9 %  sodium chloride infusion   Intravenous Once Earna Coder, MD       Zoledronic Acid (ZOMETA) IVPB 4 mg  4 mg Intravenous Once Louretta Shorten R, MD        PHYSICAL EXAMINATION:   Vitals:   11/02/22 1343  BP: (!) 140/82  Pulse: 66  Resp: 18  Temp: 97.8 F (36.6 C)  SpO2: 98%   Filed Weights   11/02/22 1343  Weight: 159 lb 3.2 oz (72.2 kg)    Physical Exam Vitals and nursing note reviewed.  HENT:     Head: Normocephalic and atraumatic.     Mouth/Throat:     Pharynx: Oropharynx is clear.  Eyes:     Extraocular Movements: Extraocular movements intact.     Pupils: Pupils are equal, round, and reactive to light.  Cardiovascular:     Rate and Rhythm: Normal rate and regular rhythm.  Pulmonary:  Comments: Decreased breath sounds bilaterally.  Abdominal:      Palpations: Abdomen is soft.  Musculoskeletal:        General: Normal range of motion.     Cervical back: Normal range of motion.  Skin:    General: Skin is warm.  Neurological:     General: No focal deficit present.     Mental Status: She is alert and oriented to person, place, and time.  Psychiatric:        Behavior: Behavior normal.        Judgment: Judgment normal.     LABORATORY DATA:  I have reviewed the data as listed Lab Results  Component Value Date   WBC 5.4 11/02/2022   HGB 12.7 11/02/2022   HCT 38.4 11/02/2022   MCV 89.9 11/02/2022   PLT 190 11/02/2022   Recent Labs    05/04/22 1306 11/02/22 1335  NA 138 138  K 3.9 3.5  CL 104 106  CO2 25 23  GLUCOSE 121* 118*  BUN 23 19  CREATININE 0.97 0.89  CALCIUM 9.0 9.0  GFRNONAA >60 >60  PROT 7.1 6.6  ALBUMIN 4.3 4.0  AST 21 19  ALT 22 23  ALKPHOS 47 50  BILITOT 0.3 0.4    RADIOGRAPHIC STUDIES: I have personally reviewed the radiological images as listed and agreed with the findings in the report. No results found.   Carcinoma of upper-outer quadrant of left breast in female, estrogen receptor positive ([facility]) #Stage I breast cancer- pT1a [5 mm; 4 mm] grade 3; pN0- ER positive PR negative; HER2 negative. HIGH RISK ONCOTYPE- RS score =35. Currently on anastrazole [oct 2022]; continue  with anastrozole+ adjuvant zometa- tolerating well. Mammo BIL- AUg 2023- WNL. Awaiting mammo in AUG 2024.   # Hot flashes- G-1- monitor for now.   #September 2022-bone density osteopenia T score -2.1;on adjuvant Zometa [JAN 2023] q 6 m x3 years [2026-FALL].  continue exercises; vit D + ca+vit D. Patient currently exercises/walks 2-3 miles a day.  Proceed with Zometa today.   # DISPOSITION: # Zometa today # follow up in 6 months- MD; labs- cbc/cmp;Zometa  Dr.B  Above plan of care was discussed with patient/family in detail.  My contact information was given to the patient/family.      Earna Coder, MD 11/02/2022  2:15 PM

## 2022-11-02 NOTE — Progress Notes (Signed)
Patient has no concerns 

## 2022-11-02 NOTE — Patient Instructions (Signed)

## 2022-11-02 NOTE — Assessment & Plan Note (Addendum)
#  Stage I breast cancer- pT1a [5 mm; 4 mm] grade 3; pN0- ER positive PR negative; HER2 negative. HIGH RISK ONCOTYPE- RS score =35. Currently on anastrazole [oct 2022]; continue  with anastrozole+ adjuvant zometa- tolerating well. Mammo BIL- AUg 2023- WNL. Awaiting mammo in AUG 2024.   # Hot flashes- G-1- monitor for now.   #September 2022-bone density osteopenia T score -2.1;on adjuvant Zometa [JAN 2023] q 6 m x3 years [2026-FALL].  continue exercises; vit D + ca+vit D. Patient currently exercises/walks 2-3 miles a day.  Proceed with Zometa today.   # DISPOSITION: # Zometa today # follow up in 6 months- MD; labs- cbc/cmp;Zometa  Dr.B

## 2022-11-23 ENCOUNTER — Ambulatory Visit
Admission: RE | Admit: 2022-11-23 | Discharge: 2022-11-23 | Disposition: A | Discharge status: Home / Self Care | Payer: Medicare HMO | Source: Ambulatory Visit | Attending: Family Medicine | Admitting: Family Medicine

## 2022-11-23 ENCOUNTER — Inpatient Hospital Stay: Admission: RE | Admit: 2022-11-23 | Payer: Medicare HMO | Source: Ambulatory Visit

## 2022-11-23 DIAGNOSIS — Z853 Personal history of malignant neoplasm of breast: Secondary | ICD-10-CM

## 2022-12-30 ENCOUNTER — Other Ambulatory Visit: Payer: Self-pay | Admitting: Internal Medicine

## 2023-01-18 ENCOUNTER — Encounter: Payer: Self-pay | Admitting: Radiation Oncology

## 2023-01-18 ENCOUNTER — Ambulatory Visit
Admission: RE | Admit: 2023-01-18 | Discharge: 2023-01-18 | Disposition: A | Discharge status: Home / Self Care | Payer: Medicare HMO | Source: Ambulatory Visit | Attending: Radiation Oncology | Admitting: Radiation Oncology

## 2023-01-18 VITALS — BP 121/86 | HR 68 | Temp 98.6°F | Resp 16 | Wt 158.2 lb

## 2023-01-18 DIAGNOSIS — Z79811 Long term (current) use of aromatase inhibitors: Secondary | ICD-10-CM | POA: Insufficient documentation

## 2023-01-18 DIAGNOSIS — Z923 Personal history of irradiation: Secondary | ICD-10-CM | POA: Insufficient documentation

## 2023-01-18 DIAGNOSIS — Z170 Estrogen receptor positive status [ER+]: Secondary | ICD-10-CM | POA: Diagnosis not present

## 2023-01-18 DIAGNOSIS — C50412 Malignant neoplasm of upper-outer quadrant of left female breast: Secondary | ICD-10-CM | POA: Insufficient documentation

## 2023-01-18 NOTE — Progress Notes (Signed)
Radiation Oncology Follow up Note  Name: Candice Bowers   Date:   01/18/2023 MRN:  [id number] DOB: [birth year]    This 67 y.o. 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  REFERRING PROVIDER: Marina Goodell, MD  HPI: Patient is a 67 year old female now out 2 years having completed whole breast radiation to her left breast for stage Ib ER positive invasive mammary carcinoma seen today in routine follow-up she is doing fairly well.  She specifically denies breast tenderness cough or bone pain..  She had mammograms back in August which I have reviewed were BI-RADS 2 benign.  She is currently on Arimidex tolerating that well without side effect.  COMPLICATIONS OF TREATMENT: none  FOLLOW UP COMPLIANCE: keeps appointments   PHYSICAL EXAM:  BP 121/86   Pulse 68   Temp 98.6 F (37 C)   Resp 16   Wt 158 lb 3.2 oz (71.8 kg)   BMI 27.15 kg/m  Left breast shows area of indentation from prior surgery.  Cosmetic result is good no dominant masses noted in either breast no axillary or supraclavicular adenopathy is appreciated.  Well-developed well-nourished patient in NAD. HEENT reveals PERLA, EOMI, discs not visualized.  Oral cavity is clear. No oral mucosal lesions are identified. Neck is clear without evidence of cervical or supraclavicular adenopathy. Lungs are clear to A&P. Cardiac examination is essentially unremarkable with regular rate and rhythm without murmur rub or thrill. Abdomen is benign with no organomegaly or masses noted. Motor sensory and DTR levels are equal and symmetric in the upper and lower extremities. Cranial nerves II through XII are grossly intact. Proprioception is intact. No peripheral adenopathy or edema is identified. No motor or sensory levels are noted. Crude visual fields are within normal range.  RADIOLOGY RESULTS: Mammograms  reviewed compatible with above-stated findings  PLAN: Present time patient is doing well now out over 2 years with no evidence of disease.  I will turn follow-up care over to medical oncology.  Patient knows to call at anytime with any concerns.  I would like to take this opportunity to thank you for allowing me to participate in the care of your patient.Candice Miller, MD

## 2023-05-05 ENCOUNTER — Encounter: Payer: Self-pay | Admitting: Internal Medicine

## 2023-05-05 ENCOUNTER — Inpatient Hospital Stay (HOSPITAL_BASED_OUTPATIENT_CLINIC_OR_DEPARTMENT_OTHER): Payer: Medicare HMO | Admitting: Internal Medicine

## 2023-05-05 ENCOUNTER — Inpatient Hospital Stay: Payer: Medicare HMO | Attending: Radiation Oncology

## 2023-05-05 ENCOUNTER — Inpatient Hospital Stay: Payer: Medicare HMO

## 2023-05-05 VITALS — BP 143/81 | HR 59 | Temp 97.1°F | Wt 160.0 lb

## 2023-05-05 DIAGNOSIS — R232 Flushing: Secondary | ICD-10-CM | POA: Insufficient documentation

## 2023-05-05 DIAGNOSIS — Z79811 Long term (current) use of aromatase inhibitors: Secondary | ICD-10-CM | POA: Diagnosis not present

## 2023-05-05 DIAGNOSIS — Z79899 Other long term (current) drug therapy: Secondary | ICD-10-CM | POA: Diagnosis not present

## 2023-05-05 DIAGNOSIS — Z803 Family history of malignant neoplasm of breast: Secondary | ICD-10-CM | POA: Insufficient documentation

## 2023-05-05 DIAGNOSIS — Z1721 Progesterone receptor positive status: Secondary | ICD-10-CM | POA: Insufficient documentation

## 2023-05-05 DIAGNOSIS — Z1722 Progesterone receptor negative status: Secondary | ICD-10-CM | POA: Diagnosis not present

## 2023-05-05 DIAGNOSIS — C50412 Malignant neoplasm of upper-outer quadrant of left female breast: Secondary | ICD-10-CM

## 2023-05-05 DIAGNOSIS — Z8041 Family history of malignant neoplasm of ovary: Secondary | ICD-10-CM | POA: Insufficient documentation

## 2023-05-05 DIAGNOSIS — Z170 Estrogen receptor positive status [ER+]: Secondary | ICD-10-CM | POA: Insufficient documentation

## 2023-05-05 DIAGNOSIS — Z87891 Personal history of nicotine dependence: Secondary | ICD-10-CM | POA: Insufficient documentation

## 2023-05-05 DIAGNOSIS — Z9851 Tubal ligation status: Secondary | ICD-10-CM | POA: Diagnosis not present

## 2023-05-05 LAB — CBC WITH DIFFERENTIAL (CANCER CENTER ONLY)
Abs Immature Granulocytes: 0.01 10*3/uL (ref 0.00–0.07)
Basophils Absolute: 0 10*3/uL (ref 0.0–0.1)
Basophils Relative: 1 %
Eosinophils Absolute: 0 10*3/uL (ref 0.0–0.5)
Eosinophils Relative: 1 %
HCT: 39.2 % (ref 36.0–46.0)
Hemoglobin: 13 g/dL (ref 12.0–15.0)
Immature Granulocytes: 0 %
Lymphocytes Relative: 29 %
Lymphs Abs: 1.5 10*3/uL (ref 0.7–4.0)
MCH: 30 pg (ref 26.0–34.0)
MCHC: 33.2 g/dL (ref 30.0–36.0)
MCV: 90.5 fL (ref 80.0–100.0)
Monocytes Absolute: 0.4 10*3/uL (ref 0.1–1.0)
Monocytes Relative: 8 %
Neutro Abs: 3 10*3/uL (ref 1.7–7.7)
Neutrophils Relative %: 61 %
Platelet Count: 201 10*3/uL (ref 150–400)
RBC: 4.33 MIL/uL (ref 3.87–5.11)
RDW: 13 % (ref 11.5–15.5)
WBC Count: 5 10*3/uL (ref 4.0–10.5)
nRBC: 0 % (ref 0.0–0.2)

## 2023-05-05 LAB — CMP (CANCER CENTER ONLY)
ALT: 21 U/L (ref 0–44)
AST: 20 U/L (ref 15–41)
Albumin: 4.2 g/dL (ref 3.5–5.0)
Alkaline Phosphatase: 49 U/L (ref 38–126)
Anion gap: 8 (ref 5–15)
BUN: 20 mg/dL (ref 8–23)
CO2: 26 mmol/L (ref 22–32)
Calcium: 9 mg/dL (ref 8.9–10.3)
Chloride: 108 mmol/L (ref 98–111)
Creatinine: 0.8 mg/dL (ref 0.44–1.00)
GFR, Estimated: >60 mL/min (ref >60)
Glucose, Bld: 95 mg/dL (ref 70–99)
Potassium: 4.2 mmol/L (ref 3.5–5.1)
Sodium: 142 mmol/L (ref 135–145)
Total Bilirubin: 0.5 mg/dL (ref 0.0–1.2)
Total Protein: 6.7 g/dL (ref 6.5–8.1)

## 2023-05-05 MED ORDER — ZOLEDRONIC ACID 4 MG/100ML IV SOLN
4.0000 mg | Freq: Once | INTRAVENOUS | Status: AC
  Administered 2023-05-05: 4 mg via INTRAVENOUS
  Filled 2023-05-05: qty 100

## 2023-05-05 MED ORDER — SODIUM CHLORIDE 0.9 % IV SOLN
Freq: Once | INTRAVENOUS | Status: AC
  Filled 2023-05-05: qty 250

## 2023-05-05 NOTE — Assessment & Plan Note (Signed)
#  Stage I breast cancer- pT1a [5 mm; 4 mm] grade 3; pN0- ER positive PR negative; HER2 negative. HIGH RISK ONCOTYPE- RS score =35. Currently on anastrazole [oct 2022]; continue  with anastrozole+ adjuvant zometa- tolerating well. Mammo BIL- AUg 2023- WNL. Mammo in AUG 2024 [PCP]- wnl.   # continue anastrozole. Pt will call for refills.   # Hot flashes- G-1- monitor for now.   #September 2022-bone density osteopenia T score -2.1;on adjuvant Zometa [JAN 2023] q 6 m x3 years [2026-FALL].  continue exercises; vit D + ca+vit D. Patient currently exercises/walks 2-3 miles a day.  Proceed with Zometa today.   # DISPOSITION: # Zometa today # follow up in 6 months- MD; labs- cbc/cmp;Zometa  Dr.B

## 2023-05-05 NOTE — Progress Notes (Signed)
[facility] CONSULT NOTE  Patient Care Team: Marina Goodell, MD as PCP - General (Family Medicine) Scarlett Presto, RN (Inactive) as Oncology Nurse Navigator Earna Coder, MD as Consulting Physician (Hematology and Oncology) Carmina Miller, MD as Consulting Physician (Radiation Oncology)  CHIEF COMPLAINTS/PURPOSE OF CONSULTATION:  Breast cancer.   Oncology History Overview Note  # JAN 2022-  0.5 cm irregular UPPER-OUTER LEFT breast mass with calcifications extending 1.5 cm anteroinferiorly, suspicious for malignancy. Ultrasound-guided biopsy of the mass is recommended. Stereotactic guided biopsy of the anterior/inferior most calcifications extending from this mass is also recommended. 2. Indeterminate 0.4 cm group of UPPER-OUTER LEFT breast calcifications, located 2.5 cm from the irregular mass above. 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 This was found to be concordant by Dr. Romona Curls.   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 This was found to be concordant by Dr. Romona Curls.   Pathology revealed COLUMNAR CELL CHANGE WITH ASSOCIATED CALCIFICATIONS, NEGATIVE FOR ATYPIA AND MALIGNANCY of the LEFT breast calcifications, medial upper outer quadrant, coil clip. This was found to be concordant by Dr. Romona Curls.   Of note, the area of invasive mammary carcinoma spans at least 1.9 cm mammographically.  # BREAST, LEFT 2:00 7 CM FN; ULTRASOUND-GUIDED BIOPSY (VISION CLIP):  - INVASIVE MAMMARY CARCINOMA, NO  SPECIAL TYPE.  Size of invasive carcinoma: 5 mm in this sample  Histologic grade of invasive carcinoma: Grade 3                       Glandular/tubular differentiation score: 3                       Nuclear pleomorphism score: 3                       Mitotic rate score: 2                       Total score: 8  Ductal carcinoma in situ: Present, high-grade with calcification  Lymphovascular invasion: Not identified   B.  BREAST WITH CALCIFICATIONS, LEFT LATERAL UPPER OUTER QUADRANT;  STEREOTACTIC BIOPSY (X SHAPED CLIP):  - INVASIVE MAMMARY CARCINOMA, NO SPECIAL TYPE.  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  - CALCIFICATIONS ASSOCIATED WITH DCIS AND SCLEROSING ADENOSIS.  - AREAS SUSPICIOUS FOR LYMPHOVASCULAR INVASION.  Size of invasive carcinoma: 4 mm in this sample  Histologic grade of invasive carcinoma: Grade 3                       Glandular/tubular differentiation score: 3                       Nuclear pleomorphism score: 3                       Mitotic rate score: 2                       Total score: 8  C.  BREAST WITH CALCIFICATIONS, LEFT MEDIAL UPPER OUTER QUADRANT;  STEREOTACTIC BIOPSY (COIL-SHAPED CLIP):  - COLUMNAR CELL CHANGE WITH ASSOCIATED CALCIFICATIONS.  - NEGATIVE FOR ATYPIA AND MALIGNANCY. BIOPSY- ER > 90%; PR-NEG; Her-2 NEG -----------------------------------------------------------------  TNM Descriptors: M (multifocal)  pT1a  Regional Lymph Nodes Modifier: SN  pN0  pM - Not applicable   #  HIGH RISK ONCOTYPE- RS score =35; which unfortunately translates to up to 23% risk of distant recurrence at 9 years.  Benefit from chemotherapy is more than 15%.  # JUne 2nd 2022- START TAX-CYTOXAN q3w x4. S/p RT  #October 2022-anastrozole; # FEB 2022-plan adjuvant Zometa every 6 months 3-5 years   Carcinoma of upper-outer quadrant of left breast in female, estrogen receptor positive (HCC)  06/06/2020 Initial Diagnosis   Carcinoma of  upper-outer quadrant of left breast in female, estrogen receptor positive (HCC)   06/06/2020 Cancer Staging   Staging form: Breast, AJCC 8th Edition - Clinical: Stage IB (cT1b, cN0, cM0, G3, ER+, PR-, HER2-) - Signed by Earna Coder, MD on 06/06/2020 Stage prefix: Initial diagnosis   09/16/2020 - 11/19/2020 Chemotherapy   Patient is on Treatment Plan : BREAST TC q21d       HISTORY OF PRESENTING ILLNESS: Patient ambulating-independently. e.  Alone.  [place] 68 y.o.  female with stage I breast cancer ER/PR positive HER2 negative -currently on adjuvant anastrozole is here for follow-up.  Patient denies any worsening joint pains.  Denies any worsening bone pain.  Mild hot flashes.   Denies any jaw pain. No dental extractions.   Review of Systems  Constitutional:  Negative for chills, diaphoresis, fever, malaise/fatigue and weight loss.  HENT:  Negative for nosebleeds and sore throat.   Eyes:  Negative for double vision.  Respiratory:  Negative for cough, hemoptysis, sputum production, shortness of breath and wheezing.   Cardiovascular:  Negative for chest pain, palpitations, orthopnea and leg swelling.  Gastrointestinal:  Negative for abdominal pain, blood in stool, constipation, diarrhea, heartburn, melena, nausea and vomiting.  Genitourinary:  Negative for dysuria, frequency and urgency.  Musculoskeletal:  Negative for back pain and joint pain.  Skin: Negative.  Negative for itching and rash.  Neurological:  Negative for dizziness, tingling, focal weakness, weakness and headaches.  Endo/Heme/Allergies:  Does not bruise/bleed easily.  Psychiatric/Behavioral:  Negative for depression. The patient is not nervous/anxious and does not have insomnia.     MEDICAL HISTORY:  Past Medical History:  Diagnosis Date   Carcinoma of upper-outer quadrant of left breast in female, estrogen receptor positive (HCC) 06/06/2020   Diabetes mellitus without complication (HCC)    diet  controlled   Family history of breast cancer    Family history of ovarian cancer    GERD (gastroesophageal reflux disease)    History of hiatal hernia    Hyperlipidemia    Hypertension    Migraines     SURGICAL HISTORY: Past Surgical History:  Procedure Laterality Date   BREAST BIOPSY Left 05/29/2020   affirm bx "X" clp-invasive   BREAST BIOPSY Left 05/29/2020   Affirm bx"Coil" clip-invasive   BREAST BIOPSY Right 2001   excisional biopsy   BREAST EXCISIONAL BIOPSY Right 06/19/1999   neg   BREAST LUMPECTOMY     BREAST LUMPECTOMY,RADIO FREQ LOCALIZER,AXILLARY SENTINEL LYMPH NODE BIOPSY Left 07/12/2020   Procedure: BREAST LUMPECTOMY,RADIO FREQ LOCALIZER,AXILLARY SENTINEL LYMPH NODE BIOPSY;  Surgeon: Duanne Guess, MD;  Location: [facility] ORS;  Service: General;  Laterality: Left;   COLONOSCOPY  02/15/2009   EXCISION  OF BREAST BIOPSY Left 07/12/2020   tag placement 07/10/20 vison tag [id number]; hourglass tag [id number]; cylinder tag [id number]   TUBAL LIGATION      SOCIAL HISTORY: Social History   Socioeconomic History   Marital status: Married    Spouse name: Darrell   Number of children: Not on file   Years of education: Not on file   Highest education level: Not on file  Occupational History   Not on file  Tobacco Use   Smoking status: Former    Current packs/day: 0.00    Types: Cigarettes    Start date: 2000    Quit date: 2020    Years since quitting: 5.0   Smokeless tobacco: Never  Vaping Use   Vaping status: Never Used  Substance and Sexual Activity   Alcohol use: Never   Drug use: Never   Sexual activity: Not on file  Other Topics Concern   Not on file  Social History Narrative   Not on file   Social Drivers of Health   Financial Resource Strain: Low Risk  (01/07/2023)   Received from [facility]   Overall Financial Resource Strain (CARDIA)    Difficulty of Paying Living Expenses: Not hard at all  Food Insecurity: No Food Insecurity (01/07/2023)    Received from [facility]   Hunger Vital Sign    Worried About Running Out of Food in the Last Year: Never true    Ran Out of Food in the Last Year: Never true  Transportation Needs: No Transportation Needs (01/07/2023)   Received from [facility] - Transportation    In the past 12 months, has lack of transportation kept you from medical appointments or from getting medications?: No    Lack of Transportation (Non-Medical): No  Physical Activity: Not on file  Stress: Not on file  Social Connections: Not on file  Intimate Partner Violence: Not on file    FAMILY HISTORY: Family History  Problem Relation Age of Onset   Ovarian cancer Sister 29   Breast cancer Maternal Grandmother        dx 30s   Breast cancer Paternal Grandmother     ALLERGIES:  has no known allergies.  MEDICATIONS:  Current Outpatient Medications  Medication Sig Dispense Refill   anastrozole (ARIMIDEX) 1 MG tablet TAKE 1 TABLET DAILY 90 tablet 1   aspirin-acetaminophen-caffeine (EXCEDRIN MIGRAINE) 250-250-65 MG tablet Take 1 tablet by mouth daily as needed (headaches).     Cholecalciferol 25 MCG (1000 UT) tablet Take 1,000 Units by mouth daily.     magnesium oxide (MAG-OX) 400 MG tablet Take 400 mg by mouth daily.     Omega-3 Fatty Acids (FISH OIL) 1000 MG CAPS Take 1,000 mg by mouth daily.     omeprazole (PRILOSEC) 20 MG capsule Take 20 mg by mouth daily.     atorvastatin (LIPITOR) 10 MG tablet Take 10 mg by mouth daily.     valsartan (DIOVAN) 80 MG tablet Take 80 mg by mouth daily.     No current facility-administered medications for this visit.   Facility-Administered Medications Ordered in Other Visits  Medication Dose Route Frequency Provider Last Rate Last Admin   Zoledronic Acid (ZOMETA) IVPB 4 mg  4 mg Intravenous Once Louretta Shorten R, MD 400 mL/hr at 05/05/23 1403 4 mg at 05/05/23 1403    PHYSICAL EXAMINATION:   Vitals:   05/05/23 1317  BP: (!)  143/81  Pulse: (!) 59  Temp: (!) 97.1 F (36.2 C)  SpO2: 100%   Filed Weights   05/05/23 1317  Weight: 160 lb (72.6 kg)    Physical Exam Vitals and nursing note reviewed.  HENT:     Head: Normocephalic and atraumatic.     Mouth/Throat:     Pharynx: Oropharynx is clear.  Eyes:     Extraocular Movements: Extraocular movements intact.     Pupils: Pupils are equal, round, and reactive to light.  Cardiovascular:     Rate and Rhythm: Normal rate and regular rhythm.  Pulmonary:     Comments: Decreased breath sounds bilaterally.  Abdominal:     Palpations: Abdomen is soft.  Musculoskeletal:        General: Normal range of motion.     Cervical back: Normal range of motion.  Skin:    General: Skin is warm.  Neurological:     General: No focal deficit present.     Mental Status: She is alert and oriented to person, place, and time.  Psychiatric:        Behavior: Behavior normal.        Judgment: Judgment normal.     LABORATORY DATA:  I have reviewed the data as listed Lab Results  Component Value Date   WBC 5.0 05/05/2023   HGB 13.0 05/05/2023   HCT 39.2 05/05/2023   MCV 90.5 05/05/2023   PLT 201 05/05/2023   Recent Labs    11/02/22 1335 05/05/23 1308  NA 138 142  K 3.5 4.2  CL 106 108  CO2 23 26  GLUCOSE 118* 95  BUN 19 20  CREATININE 0.89 0.80  CALCIUM 9.0 9.0  GFRNONAA >60 >60  PROT 6.6 6.7  ALBUMIN 4.0 4.2  AST 19 20  ALT 23 21  ALKPHOS 50 49  BILITOT 0.4 0.5    RADIOGRAPHIC STUDIES: I have personally reviewed the radiological images as listed and agreed with the findings in the report. No results found.   Carcinoma of upper-outer quadrant of left breast in female, estrogen receptor positive (HCC) #Stage I breast cancer- pT1a [5 mm; 4 mm] grade 3; pN0- ER positive PR negative; HER2 negative. HIGH RISK ONCOTYPE- RS score =35. Currently on anastrazole [oct 2022]; continue  with anastrozole+ adjuvant zometa- tolerating well. Mammo BIL- AUg 2023- WNL.  Mammo in AUG 2024 [PCP]- wnl.   # continue anastrozole. Pt will call for refills.   # Hot flashes- G-1- monitor for now.   #September 2022-bone density osteopenia T score -2.1;on adjuvant Zometa [JAN 2023] q 6 m x3 years [2026-FALL].  continue exercises; vit D + ca+vit D. Patient currently exercises/walks 2-3 miles a day.  Proceed with Zometa today.   # DISPOSITION: # Zometa today # follow up in 6 months- MD; labs- cbc/cmp;Zometa  Dr.B  Above plan of care was discussed with patient/family in detail.  My contact information was given to the patient/family.      Earna Coder, MD 05/05/2023 2:05 PM

## 2023-05-05 NOTE — Patient Instructions (Signed)

## 2023-05-05 NOTE — Progress Notes (Signed)
Patient here for follow up. No new concerns voiced.  °

## 2023-07-04 ENCOUNTER — Other Ambulatory Visit: Payer: Self-pay | Admitting: Internal Medicine

## 2023-07-05 ENCOUNTER — Encounter: Payer: Self-pay | Admitting: Internal Medicine

## 2023-11-02 ENCOUNTER — Inpatient Hospital Stay: Payer: Medicare HMO | Attending: Internal Medicine

## 2023-11-02 ENCOUNTER — Inpatient Hospital Stay: Payer: Medicare HMO

## 2023-11-02 ENCOUNTER — Inpatient Hospital Stay (HOSPITAL_BASED_OUTPATIENT_CLINIC_OR_DEPARTMENT_OTHER): Payer: Medicare HMO | Admitting: Internal Medicine

## 2023-11-02 ENCOUNTER — Encounter: Payer: Self-pay | Admitting: Internal Medicine

## 2023-11-02 VITALS — BP 134/86 | HR 77 | Temp 97.3°F | Resp 19 | Ht 64.0 in | Wt 161.7 lb

## 2023-11-02 DIAGNOSIS — Z1722 Progesterone receptor negative status: Secondary | ICD-10-CM | POA: Diagnosis not present

## 2023-11-02 DIAGNOSIS — C50412 Malignant neoplasm of upper-outer quadrant of left female breast: Secondary | ICD-10-CM

## 2023-11-02 DIAGNOSIS — Z170 Estrogen receptor positive status [ER+]: Secondary | ICD-10-CM | POA: Diagnosis not present

## 2023-11-02 DIAGNOSIS — Z79899 Other long term (current) drug therapy: Secondary | ICD-10-CM | POA: Diagnosis not present

## 2023-11-02 DIAGNOSIS — M8580 Other specified disorders of bone density and structure, unspecified site: Secondary | ICD-10-CM | POA: Insufficient documentation

## 2023-11-02 DIAGNOSIS — Z87891 Personal history of nicotine dependence: Secondary | ICD-10-CM | POA: Diagnosis not present

## 2023-11-02 DIAGNOSIS — Z1732 Human epidermal growth factor receptor 2 negative status: Secondary | ICD-10-CM | POA: Insufficient documentation

## 2023-11-02 DIAGNOSIS — Z79811 Long term (current) use of aromatase inhibitors: Secondary | ICD-10-CM | POA: Diagnosis not present

## 2023-11-02 DIAGNOSIS — Z803 Family history of malignant neoplasm of breast: Secondary | ICD-10-CM | POA: Insufficient documentation

## 2023-11-02 DIAGNOSIS — R232 Flushing: Secondary | ICD-10-CM | POA: Diagnosis not present

## 2023-11-02 DIAGNOSIS — Z8041 Family history of malignant neoplasm of ovary: Secondary | ICD-10-CM | POA: Insufficient documentation

## 2023-11-02 LAB — CBC WITH DIFFERENTIAL (CANCER CENTER ONLY)
Abs Immature Granulocytes: 0.01 K/uL (ref 0.00–0.07)
Basophils Absolute: 0 K/uL (ref 0.0–0.1)
Basophils Relative: 1 %
Eosinophils Absolute: 0 K/uL (ref 0.0–0.5)
Eosinophils Relative: 1 %
HCT: 38 % (ref 36.0–46.0)
Hemoglobin: 12.5 g/dL (ref 12.0–15.0)
Immature Granulocytes: 0 %
Lymphocytes Relative: 29 %
Lymphs Abs: 1.7 K/uL (ref 0.7–4.0)
MCH: 29.9 pg (ref 26.0–34.0)
MCHC: 32.9 g/dL (ref 30.0–36.0)
MCV: 90.9 fL (ref 80.0–100.0)
Monocytes Absolute: 0.4 K/uL (ref 0.1–1.0)
Monocytes Relative: 8 %
Neutro Abs: 3.7 K/uL (ref 1.7–7.7)
Neutrophils Relative %: 61 %
Platelet Count: 209 K/uL (ref 150–400)
RBC: 4.18 MIL/uL (ref 3.87–5.11)
RDW: 13.2 % (ref 11.5–15.5)
WBC Count: 5.9 K/uL (ref 4.0–10.5)
nRBC: 0 % (ref 0.0–0.2)

## 2023-11-02 LAB — CMP (CANCER CENTER ONLY)
ALT: 20 U/L (ref 0–44)
AST: 21 U/L (ref 15–41)
Albumin: 4.5 g/dL (ref 3.5–5.0)
Alkaline Phosphatase: 48 U/L (ref 38–126)
Anion gap: 7 (ref 5–15)
BUN: 24 mg/dL — ABNORMAL HIGH (ref 8–23)
CO2: 25 mmol/L (ref 22–32)
Calcium: 9.4 mg/dL (ref 8.9–10.3)
Chloride: 108 mmol/L (ref 98–111)
Creatinine: 0.97 mg/dL (ref 0.44–1.00)
GFR, Estimated: >60 mL/min (ref >60)
Glucose, Bld: 105 mg/dL — ABNORMAL HIGH (ref 70–99)
Potassium: 4 mmol/L (ref 3.5–5.1)
Sodium: 140 mmol/L (ref 135–145)
Total Bilirubin: 0.7 mg/dL (ref 0.0–1.2)
Total Protein: 6.9 g/dL (ref 6.5–8.1)

## 2023-11-02 MED ORDER — ZOLEDRONIC ACID 4 MG/100ML IV SOLN
4.0000 mg | Freq: Once | INTRAVENOUS | Status: AC
  Administered 2023-11-02: 4 mg via INTRAVENOUS
  Filled 2023-11-02: qty 100

## 2023-11-02 MED ORDER — SODIUM CHLORIDE 0.9 % IV SOLN
Freq: Once | INTRAVENOUS | Status: AC
  Filled 2023-11-02: qty 250

## 2023-11-02 NOTE — Patient Instructions (Signed)

## 2023-11-02 NOTE — Assessment & Plan Note (Addendum)
#  Stage I breast cancer- pT1a [5 mm; 4 mm] grade 3; pN0- ER positive PR negative; HER2 negative. HIGH RISK ONCOTYPE- RS score =35. Currently on anastrazole [oct 2022]; continue  with anastrozole + adjuvant zometa - tolerating well. Mammo BIL- AUg 2024- WNL. Mammo in AUG 2025 [PCP]- pending.    # continue anastrozole  [plan extended AI- for 10 years- will finish fall 2032]. Pt will call for refills.   # Hot flashes- G-1- monitor for now.   #September 2022-bone density osteopenia T score -2.1;on adjuvant Zometa  [JAN 2022] q 6 m x3 years [2025-FALL].  continue exercises; vit D + ca+vit D. Patient currently exercises/walks 2-3 miles a day.  Proceed with Zometa  today- #6 infusion today- 7/22. Will order BMD today  # DISPOSITION: # Zometa  today # follow up in 6 months- MD; labs- cbc/cmp;vit D 25-OH; - BMD  Dr.B

## 2023-11-02 NOTE — Progress Notes (Signed)
 [facility] CONSULT NOTE  Patient Care Team: Jeffie Cheryl BRAVO, MD as PCP - General (Family Medicine) Dannielle Arlean FALCON, RN (Inactive) as Oncology Nurse Navigator Rennie Cindy SAUNDERS, MD as Consulting Physician (Oncology) Lenn Aran, MD as Consulting Physician (Radiation Oncology)  CHIEF COMPLAINTS/PURPOSE OF CONSULTATION:  Breast cancer.   Oncology History Overview Note  # JAN 2022-  0.5 cm irregular UPPER-OUTER LEFT breast mass with calcifications extending 1.5 cm anteroinferiorly, suspicious for malignancy. Ultrasound-guided biopsy of the mass is recommended. Stereotactic guided biopsy of the anterior/inferior most calcifications extending from this mass is also recommended. 2. Indeterminate 0.4 cm group of UPPER-OUTER LEFT breast calcifications, located 2.5 cm from the irregular mass above. 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 This was found to be concordant by Dr. Norman Hopper.   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 This was found to be concordant by Dr. Norman Hopper.   Pathology revealed COLUMNAR CELL CHANGE WITH ASSOCIATED CALCIFICATIONS, NEGATIVE FOR ATYPIA AND MALIGNANCY of the LEFT breast calcifications, medial upper outer quadrant, coil clip. This was found to be concordant by Dr. Norman Hopper.   Of note, the area of invasive mammary carcinoma spans at least 1.9 cm mammographically.  # BREAST, LEFT 2:00 7 CM FN; ULTRASOUND-GUIDED BIOPSY (VISION CLIP):  - INVASIVE MAMMARY CARCINOMA, NO SPECIAL TYPE.  Size  of invasive carcinoma: 5 mm in this sample  Histologic grade of invasive carcinoma: Grade 3                       Glandular/tubular differentiation score: 3                       Nuclear pleomorphism score: 3                       Mitotic rate score: 2                       Total score: 8  Ductal carcinoma in situ: Present, high-grade with calcification  Lymphovascular invasion: Not identified   B.  BREAST WITH CALCIFICATIONS, LEFT LATERAL UPPER OUTER QUADRANT;  STEREOTACTIC BIOPSY (X SHAPED CLIP):  - INVASIVE MAMMARY CARCINOMA, NO SPECIAL TYPE.  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  - CALCIFICATIONS ASSOCIATED WITH DCIS AND SCLEROSING ADENOSIS.  - AREAS SUSPICIOUS FOR LYMPHOVASCULAR INVASION.  Size of invasive carcinoma: 4 mm in this sample  Histologic grade of invasive carcinoma: Grade 3                       Glandular/tubular differentiation score: 3                       Nuclear pleomorphism score: 3                       Mitotic rate score: 2                       Total score: 8   C.  BREAST WITH CALCIFICATIONS, LEFT MEDIAL UPPER OUTER QUADRANT;  STEREOTACTIC BIOPSY (COIL-SHAPED CLIP):  - COLUMNAR CELL CHANGE WITH ASSOCIATED CALCIFICATIONS.  - NEGATIVE FOR ATYPIA AND MALIGNANCY. BIOPSY- ER > 90%; PR-NEG; Her-2 NEG -----------------------------------------------------------------  TNM Descriptors: M (multifocal)  pT1a  Regional Lymph Nodes Modifier: SN  pN0  pM - Not applicable   #  HIGH RISK ONCOTYPE- RS score =35; which unfortunately translates to up to 23% risk of distant recurrence at 9 years.  Benefit from chemotherapy is more than 15%.  # JUne 2nd 2022- START TAX-CYTOXAN  q3w x4. S/p RT  #October 2022-anastrozole ; # FEB 2022-plan adjuvant Zometa  every 6 months 3-5 years   Carcinoma of upper-outer quadrant of left breast in female, estrogen receptor positive (HCC)  06/06/2020 Initial Diagnosis   Carcinoma of upper-outer quadrant  of left breast in female, estrogen receptor positive (HCC)   06/06/2020 Cancer Staging   Staging form: Breast, AJCC 8th Edition - Clinical: Stage IB (cT1b, cN0, cM0, G3, ER+, PR-, HER2-) - Signed by Rennie Cindy SAUNDERS, MD on 06/06/2020 Stage prefix: Initial diagnosis   09/16/2020 - 11/19/2020 Chemotherapy   Patient is on Treatment Plan : BREAST TC q21d       HISTORY OF PRESENTING ILLNESS: Patient ambulating-independentlyAlone.  Candice Bowers 68 y.o.  female with stage I breast cancer ER/PR positive HER2 negative -currently on adjuvant anastrozole  is here for follow-up.  Patient denies any worsening joint pains.  Denies any worsening bone pain.  Mild hot flashes. Denies any jaw pain. No dental extractions.   Review of Systems  Constitutional:  Negative for chills, diaphoresis, fever, malaise/fatigue and weight loss.  HENT:  Negative for nosebleeds and sore throat.   Eyes:  Negative for double vision.  Respiratory:  Negative for cough, hemoptysis, sputum production, shortness of breath and wheezing.   Cardiovascular:  Negative for chest pain, palpitations, orthopnea and leg swelling.  Gastrointestinal:  Negative for abdominal pain, blood in stool, constipation, diarrhea, heartburn, melena, nausea and vomiting.  Genitourinary:  Negative for dysuria, frequency and urgency.  Musculoskeletal:  Negative for back pain and joint pain.  Skin: Negative.  Negative for itching and rash.  Neurological:  Negative for dizziness, tingling, focal weakness, weakness and headaches.  Endo/Heme/Allergies:  Does not bruise/bleed easily.  Psychiatric/Behavioral:  Negative for depression. The patient is not nervous/anxious and does not have insomnia.     MEDICAL HISTORY:  Past Medical History:  Diagnosis Date   Carcinoma of upper-outer quadrant of left breast in female, estrogen receptor positive (HCC) 06/06/2020   Diabetes mellitus without complication (HCC)    diet controlled   Family history of breast  cancer    Family history of ovarian cancer    GERD (gastroesophageal reflux disease)    History of hiatal hernia    Hyperlipidemia    Hypertension    Migraines     SURGICAL HISTORY: Past Surgical History:  Procedure Laterality Date   BREAST BIOPSY Left 05/29/2020   affirm bx X clp-invasive   BREAST BIOPSY Left 05/29/2020   Affirm bxCoil clip-invasive   BREAST BIOPSY Right 2001   excisional biopsy   BREAST EXCISIONAL BIOPSY Right 06/19/1999   neg   BREAST LUMPECTOMY     BREAST LUMPECTOMY,RADIO FREQ LOCALIZER,AXILLARY SENTINEL LYMPH NODE BIOPSY Left 07/12/2020   Procedure: BREAST LUMPECTOMY,RADIO FREQ LOCALIZER,AXILLARY SENTINEL LYMPH NODE BIOPSY;  Surgeon: Marolyn Nest, MD;  Location: [facility] ORS;  Service: General;  Laterality: Left;   COLONOSCOPY  02/15/2009   EXCISION OF BREAST BIOPSY Left 07/12/2020  tag placement 07/10/20 vison tag [id number]; hourglass tag [phone]; cylinder tag [phone]   TUBAL LIGATION      SOCIAL HISTORY: Social History   Socioeconomic History   Marital status: Married    Spouse name: Darrell   Number of children: Not on file   Years of education: Not on file   Highest education level: Not on file  Occupational History   Not on file  Tobacco Use   Smoking status: Former    Current packs/day: 0.00    Types: Cigarettes    Start date: 2000    Quit date: 2020    Years since quitting: 5.5   Smokeless tobacco: Never  Vaping Use   Vaping status: Never Used  Substance and Sexual Activity   Alcohol use: Never   Drug use: Never   Sexual activity: Not on file  Other Topics Concern   Not on file  Social History Narrative   Not on file   Social Drivers of Health   Financial Resource Strain: Low Risk  (07/20/2023)   Received from [facility]   Overall Financial Resource Strain (CARDIA)    Difficulty of Paying Living Expenses: Not hard at all  Food Insecurity: No Food Insecurity (07/20/2023)   Received from [facility]    Hunger Vital Sign    Within the past 12 months, you worried that your food would run out before you got the money to buy more.: Never true    Within the past 12 months, the food you bought just didn't last and you didn't have money to get more.: Never true  Transportation Needs: No Transportation Needs (07/20/2023)   Received from [facility] - Transportation    In the past 12 months, has lack of transportation kept you from medical appointments or from getting medications?: No    Lack of Transportation (Non-Medical): No  Physical Activity: Not on file  Stress: Not on file  Social Connections: Not on file  Intimate Partner Violence: Not on file    FAMILY HISTORY: Family History  Problem Relation Age of Onset   Ovarian cancer Sister 18   Breast cancer Maternal Grandmother        dx 30s   Breast cancer Paternal Grandmother     ALLERGIES:  has no known allergies.  MEDICATIONS:  Current Outpatient Medications  Medication Sig Dispense Refill   anastrozole  (ARIMIDEX ) 1 MG tablet TAKE 1 TABLET DAILY 90 tablet 1   aspirin-acetaminophen -caffeine (EXCEDRIN MIGRAINE) 250-250-65 MG tablet Take 1 tablet by mouth daily as needed (headaches).     atorvastatin (LIPITOR) 10 MG tablet Take 10 mg by mouth daily.     chlorhexidine  (PERIDEX ) 0.12 % solution DIP TOOTHBRUSH IN SOLUTION AND USE TO BRUSH AROUND IMPLANTS TWICE A DAY     Cholecalciferol 25 MCG (1000 UT) tablet Take 1,000 Units by mouth daily.     levothyroxine (SYNTHROID) 25 MCG tablet Take 25 mcg by mouth.     magnesium oxide (MAG-OX) 400 MG tablet Take 400 mg by mouth daily.     Omega-3 Fatty Acids (FISH OIL) 1000 MG CAPS Take 1,000 mg by mouth daily.     omeprazole (PRILOSEC) 20 MG capsule Take 20 mg by mouth daily.     valsartan (DIOVAN) 80 MG tablet Take 80 mg by mouth daily.     No current facility-administered medications for this visit.    PHYSICAL EXAMINATION:   Vitals:   11/02/23 1407  BP:  134/86  Pulse: 77  Resp: 19  Temp: (!) 97.3 F (36.3 C)  SpO2: 100%   Filed Weights   11/02/23 1407  Weight: 161 lb 11.2 oz (73.3 kg)    Physical Exam Vitals and nursing note reviewed.  HENT:     Head: Normocephalic and atraumatic.     Mouth/Throat:     Pharynx: Oropharynx is clear.  Eyes:     Extraocular Movements: Extraocular movements intact.     Pupils: Pupils are equal, round, and reactive to light.  Cardiovascular:     Rate and Rhythm: Normal rate and regular rhythm.  Pulmonary:     Comments: Decreased breath sounds bilaterally.  Abdominal:     Palpations: Abdomen is soft.  Musculoskeletal:        General: Normal range of motion.     Cervical back: Normal range of motion.  Skin:    General: Skin is warm.  Neurological:     General: No focal deficit present.     Mental Status: She is alert and oriented to person, place, and time.  Psychiatric:        Behavior: Behavior normal.        Judgment: Judgment normal.     LABORATORY DATA:  I have reviewed the data as listed Lab Results  Component Value Date   WBC 5.9 11/02/2023   HGB 12.5 11/02/2023   HCT 38.0 11/02/2023   MCV 90.9 11/02/2023   PLT 209 11/02/2023   Recent Labs    05/05/23 1308 11/02/23 1353  NA 142 140  K 4.2 4.0  CL 108 108  CO2 26 25  GLUCOSE 95 105*  BUN 20 24*  CREATININE 0.80 0.97  CALCIUM 9.0 9.4  GFRNONAA >60 >60  PROT 6.7 6.9  ALBUMIN 4.2 4.5  AST 20 21  ALT 21 20  ALKPHOS 49 48  BILITOT 0.5 0.7    RADIOGRAPHIC STUDIES: I have personally reviewed the radiological images as listed and agreed with the findings in the report. No results found.   Carcinoma of upper-outer quadrant of left breast in female, estrogen receptor positive (HCC) #Stage I breast cancer- pT1a [5 mm; 4 mm] grade 3; pN0- ER positive PR negative; HER2 negative. HIGH RISK ONCOTYPE- RS score =35. Currently on anastrazole [oct 2022]; continue  with anastrozole + adjuvant zometa - tolerating well. Mammo  BIL- AUg 2024- WNL. Mammo in AUG 2025 [PCP]- pending.    # continue anastrozole  [plan extended AI- for 10 years- will finish fall 2032]. Pt will call for refills.   # Hot flashes- G-1- monitor for now.   #September 2022-bone density osteopenia T score -2.1;on adjuvant Zometa  [JAN 2022] q 6 m x3 years [2025-FALL].  continue exercises; vit D + ca+vit D. Patient currently exercises/walks 2-3 miles a day.  Proceed with Zometa  today- #6 infusion today- 7/22. Will order BMD today  # DISPOSITION: # Zometa  today # follow up in 6 months- MD; labs- cbc/cmp;vit D 25-OH; - BMD  Dr.B  Above plan of care was discussed with patient/family in detail.  My contact information was given to the patient/family.      Cindy JONELLE Joe, MD 11/02/2023 2:34 PM

## 2023-11-02 NOTE — Progress Notes (Signed)
 Patient presents to be doing well with no new or acute concerns.

## 2023-12-24 ENCOUNTER — Other Ambulatory Visit: Payer: Self-pay | Admitting: Internal Medicine

## 2024-01-24 ENCOUNTER — Other Ambulatory Visit: Payer: Self-pay | Admitting: Family Medicine

## 2024-01-24 DIAGNOSIS — Z1231 Encounter for screening mammogram for malignant neoplasm of breast: Secondary | ICD-10-CM

## 2024-02-17 ENCOUNTER — Ambulatory Visit

## 2024-02-17 DIAGNOSIS — D485 Neoplasm of uncertain behavior of skin: Secondary | ICD-10-CM

## 2024-02-17 DIAGNOSIS — W908XXA Exposure to other nonionizing radiation, initial encounter: Secondary | ICD-10-CM

## 2024-02-17 DIAGNOSIS — Z1283 Encounter for screening for malignant neoplasm of skin: Secondary | ICD-10-CM

## 2024-02-17 DIAGNOSIS — L821 Other seborrheic keratosis: Secondary | ICD-10-CM

## 2024-02-17 DIAGNOSIS — D1801 Hemangioma of skin and subcutaneous tissue: Secondary | ICD-10-CM

## 2024-02-17 DIAGNOSIS — L814 Other melanin hyperpigmentation: Secondary | ICD-10-CM | POA: Diagnosis not present

## 2024-02-17 DIAGNOSIS — D229 Melanocytic nevi, unspecified: Secondary | ICD-10-CM

## 2024-02-17 DIAGNOSIS — L578 Other skin changes due to chronic exposure to nonionizing radiation: Secondary | ICD-10-CM

## 2024-02-17 NOTE — Patient Instructions (Addendum)

## 2024-02-17 NOTE — Progress Notes (Signed)
 Subjective   Candice  Bowers is a 68 y.o. female who presents for the following: Lesion(s) of concern . Patient is new patient  Today patient reports: Spots at face, one at left forehead itches and she scratches and one at left cheek getting darker. No hx skin cancer.  Review of Systems:    No other skin or systemic complaints except as noted in HPI or Assessment and Plan.  The following portions of the chart were reviewed this encounter and updated as appropriate: medications, allergies, medical history  Relevant Medical History:  Family history of skin cancer - unknown what type   Objective  (SKPE) Well appearing patient in no apparent distress; mood and affect are within normal limits. Examination was performed of the: Sun Exposed Exam: Scalp, head, eyes, ears, nose, lips, neck, upper extremities, hands, fingers, fingernails  Examination notable for: Angioma(s): Scattered red vascular papule(s)  , Lentigo/lentigines: Scattered pigmented macules that are tan to brown in color and are somewhat non-uniform in shape and concentrated in the sun-exposed areas, Nevus/nevi: Scattered well-demarcated, regular, pigmented macule(s) and/or papule(s)  , Seborrheic Keratosis(es): Stuck-on appearing keratotic papule(s) on the trunk, some  irritated with redness, crusting, edema, and/or partial avulsion, Actinic Damage/Elastosis: chronic sun damage: dyspigmentation, telangiectasia, and wrinkling  Examination limited by: Undergarments, Shoes or socks , Clothing, and Patient deferred removal     left malar cheek 5 mm irregular brown macule   Assessment & Plan  (SKAP)   BENIGN SKIN FINDINGS  - Lentigines  - Seborrheic keratoses  - Hemangiomas   - Nevus/Multiple Benign Nevi - Reassurance provided regarding the benign appearance of lesions noted on exam today; no treatment is indicated in the absence of symptoms/changes. - Reinforced importance of photoprotective strategies including liberal and  frequent sunscreen use of a broad-spectrum SPF 30 or greater, use of protective clothing, and sun avoidance for prevention of cutaneous malignancy and photoaging.  Counseled patient on the importance of regular self-skin monitoring as well as routine clinical skin examinations as scheduled.   ACTINIC DAMAGE - Chronic condition, secondary to cumulative UV/sun exposure - Recommend daily broad spectrum sunscreen SPF 30+ to sun-exposed areas, reapply every 2 hours as needed.  - Staying in the shade or wearing long sleeves, sun glasses (UVA+UVB protection) and wide brim hats (4-inch brim around the entire circumference of the hat) are also recommended for sun protection.  - Call for new or changing lesions.   Patient instructions (SKPI)   Procedures, orders, diagnosis for this visit:  NEOPLASM OF UNCERTAIN BEHAVIOR OF SKIN left malar cheek Epidermal / dermal shaving  Lesion diameter (cm):  0.5 Informed consent: discussed and consent obtained   Timeout: patient name, date of birth, surgical site, and procedure verified   Procedure prep:  Patient was prepped and draped in usual sterile fashion Prep type:  Isopropyl alcohol Anesthesia: the lesion was anesthetized in a standard fashion   Anesthetic:  1% lidocaine  w/ epinephrine  1-100,000 buffered w/ 8.4% NaHCO3 Instrument used: DermaBlade   Hemostasis achieved with: pressure and aluminum chloride   Outcome: patient tolerated procedure well   Post-procedure details: wound care instructions given    Specimen 1 - Surgical pathology Differential Diagnosis: Lentigo vs Lentigo Maligna vs Other  Check Margins: No  Neoplasm of uncertain behavior of skin -     Epidermal / dermal shaving -     Surgical pathology; Standing    Return to clinic: No follow-ups on file.  Called patient to check on them after yesterday's surgery.  N/A, LVM to call if any concerns. Lonell RAMAN., RMA   Documentation: I have reviewed the above documentation for accuracy  and completeness, and I agree with the above.  Lauraine JAYSON Kanaris, MD

## 2024-02-22 LAB — SURGICAL PATHOLOGY

## 2024-02-23 ENCOUNTER — Ambulatory Visit: Payer: Self-pay

## 2024-02-23 NOTE — Progress Notes (Signed)
Patient informed and voiced good understanding.

## 2024-03-16 ENCOUNTER — Ambulatory Visit
Admission: RE | Admit: 2024-03-16 | Discharge: 2024-03-16 | Disposition: A | Source: Ambulatory Visit | Attending: Internal Medicine

## 2024-03-16 ENCOUNTER — Ambulatory Visit
Admission: RE | Admit: 2024-03-16 | Discharge: 2024-03-16 | Disposition: A | Source: Ambulatory Visit | Attending: Family Medicine | Admitting: Family Medicine

## 2024-03-16 DIAGNOSIS — Z170 Estrogen receptor positive status [ER+]: Secondary | ICD-10-CM | POA: Insufficient documentation

## 2024-03-16 DIAGNOSIS — Z1231 Encounter for screening mammogram for malignant neoplasm of breast: Secondary | ICD-10-CM | POA: Insufficient documentation

## 2024-03-16 DIAGNOSIS — C50412 Malignant neoplasm of upper-outer quadrant of left female breast: Secondary | ICD-10-CM | POA: Insufficient documentation

## 2024-05-04 ENCOUNTER — Inpatient Hospital Stay: Attending: Internal Medicine

## 2024-05-04 ENCOUNTER — Inpatient Hospital Stay

## 2024-05-04 ENCOUNTER — Inpatient Hospital Stay: Admitting: Internal Medicine

## 2024-05-04 ENCOUNTER — Encounter: Payer: Self-pay | Admitting: Internal Medicine

## 2024-05-04 DIAGNOSIS — Z170 Estrogen receptor positive status [ER+]: Secondary | ICD-10-CM | POA: Diagnosis not present

## 2024-05-04 DIAGNOSIS — C50412 Malignant neoplasm of upper-outer quadrant of left female breast: Secondary | ICD-10-CM

## 2024-05-04 LAB — CBC WITH DIFFERENTIAL (CANCER CENTER ONLY)
Abs Immature Granulocytes: 0.01 K/uL (ref 0.00–0.07)
Basophils Absolute: 0 K/uL (ref 0.0–0.1)
Basophils Relative: 0 %
Eosinophils Absolute: 0 K/uL (ref 0.0–0.5)
Eosinophils Relative: 1 %
HCT: 38.4 % (ref 36.0–46.0)
Hemoglobin: 12.6 g/dL (ref 12.0–15.0)
Immature Granulocytes: 0 %
Lymphocytes Relative: 27 %
Lymphs Abs: 1.3 K/uL (ref 0.7–4.0)
MCH: 29.6 pg (ref 26.0–34.0)
MCHC: 32.8 g/dL (ref 30.0–36.0)
MCV: 90.4 fL (ref 80.0–100.0)
Monocytes Absolute: 0.3 K/uL (ref 0.1–1.0)
Monocytes Relative: 6 %
Neutro Abs: 3.3 K/uL (ref 1.7–7.7)
Neutrophils Relative %: 66 %
Platelet Count: 211 K/uL (ref 150–400)
RBC: 4.25 MIL/uL (ref 3.87–5.11)
RDW: 13.2 % (ref 11.5–15.5)
WBC Count: 5 K/uL (ref 4.0–10.5)
nRBC: 0 % (ref 0.0–0.2)

## 2024-05-04 LAB — CMP (CANCER CENTER ONLY)
ALT: 28 U/L (ref 0–44)
AST: 28 U/L (ref 15–41)
Albumin: 4.7 g/dL (ref 3.5–5.0)
Alkaline Phosphatase: 56 U/L (ref 38–126)
Anion gap: 10 (ref 5–15)
BUN: 18 mg/dL (ref 8–23)
CO2: 25 mmol/L (ref 22–32)
Calcium: 9.6 mg/dL (ref 8.9–10.3)
Chloride: 106 mmol/L (ref 98–111)
Creatinine: 0.86 mg/dL (ref 0.44–1.00)
GFR, Estimated: >60 mL/min
Glucose, Bld: 98 mg/dL (ref 70–99)
Potassium: 4 mmol/L (ref 3.5–5.1)
Sodium: 141 mmol/L (ref 135–145)
Total Bilirubin: 0.3 mg/dL (ref 0.0–1.2)
Total Protein: 6.9 g/dL (ref 6.5–8.1)

## 2024-05-04 LAB — VITAMIN D 25 HYDROXY (VIT D DEFICIENCY, FRACTURES): Vit D, 25-Hydroxy: 28.8 ng/mL — ABNORMAL LOW (ref 30–100)

## 2024-05-04 NOTE — Assessment & Plan Note (Addendum)
#  Stage I breast cancer- pT1a [5 mm; 4 mm] grade 3; pN0- ER positive PR negative; HER2 negative. HIGH RISK ONCOTYPE- RS score =35. Currently on anastrazole [oct 2022]; continue  with anastrozole + adjuvant zometa - tolerating well. Mammo BIL- DEC 2025- WNL.   # continue anastrozole  [plan extended AI- for 10 years- will finish fall 2032]. Pt will call for refills.   # Ear ache- [dentist- Dr.Orr next week]- recommend claritin- D/ daily- x 1 week.  Suggestive ofClinically not ONJ-again discussed with the patient recommend she brings it up with her dentist next week appointment.  # Hot flashes- G-1- monitor for now.   # DEC 2026- -bone density osteopenias/p adjuvant Zometa  [JAN 2022] q 6 m x3 years [2025-FALL].  continue exercises; vit D + ca+vit D. Patient currently exercises/walks 2-3 miles a day.    # DISPOSITION: # no zometa  # follow up in 6 months- MD; labs- cbc/cmp;vit D 25-OH  Dr.B

## 2024-05-04 NOTE — Progress Notes (Signed)
 Bone density and mammogram 03/16/24.  Could you check her rt ear? Hurts behind the ear and jaw x2 weeks. Concerned she may have ear infection. No fever.

## 2024-05-04 NOTE — Progress Notes (Signed)
 [facility] CONSULT NOTE  Patient Care Team: Jeffie Cheryl BRAVO, MD as PCP - General (Family Medicine) Dannielle Arlean FALCON, RN (Inactive) as Oncology Nurse Navigator Rennie Cindy SAUNDERS, MD as Consulting Physician (Oncology) Lenn Aran, MD as Consulting Physician (Radiation Oncology)  CHIEF COMPLAINTS/PURPOSE OF CONSULTATION:  Breast cancer.   Oncology History Overview Note  # JAN 2022-  0.5 cm irregular UPPER-OUTER LEFT breast mass with calcifications extending 1.5 cm anteroinferiorly, suspicious for malignancy. Ultrasound-guided biopsy of the mass is recommended. Stereotactic guided biopsy of the anterior/inferior most calcifications extending from this mass is also recommended. 2. Indeterminate 0.4 cm group of UPPER-OUTER LEFT breast calcifications, located 2.5 cm from the irregular mass above. 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 This was found to be concordant by Dr. Norman Hopper.   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 This was found to be concordant by Dr. Norman Hopper.   Pathology revealed COLUMNAR CELL CHANGE WITH ASSOCIATED CALCIFICATIONS, NEGATIVE FOR ATYPIA AND MALIGNANCY of the LEFT breast calcifications, medial upper outer quadrant, coil clip. This was found to be concordant by Dr. Norman Hopper.   Of note, the area of invasive mammary carcinoma spans at least 1.9 cm mammographically.  # BREAST, LEFT 2:00 7 CM FN; ULTRASOUND-GUIDED BIOPSY (VISION CLIP):  - INVASIVE MAMMARY CARCINOMA, NO SPECIAL TYPE.  Size  of invasive carcinoma: 5 mm in this sample  Histologic grade of invasive carcinoma: Grade 3                       Glandular/tubular differentiation score: 3                       Nuclear pleomorphism score: 3                       Mitotic rate score: 2                       Total score: 8  Ductal carcinoma in situ: Present, high-grade with calcification  Lymphovascular invasion: Not identified   B.  BREAST WITH CALCIFICATIONS, LEFT LATERAL UPPER OUTER QUADRANT;  STEREOTACTIC BIOPSY (X SHAPED CLIP):  - INVASIVE MAMMARY CARCINOMA, NO SPECIAL TYPE.  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  - CALCIFICATIONS ASSOCIATED WITH DCIS AND SCLEROSING ADENOSIS.  - AREAS SUSPICIOUS FOR LYMPHOVASCULAR INVASION.  Size of invasive carcinoma: 4 mm in this sample  Histologic grade of invasive carcinoma: Grade 3                       Glandular/tubular differentiation score: 3                       Nuclear pleomorphism score: 3                       Mitotic rate score: 2                       Total score: 8   C.  BREAST WITH CALCIFICATIONS, LEFT MEDIAL UPPER OUTER QUADRANT;  STEREOTACTIC BIOPSY (COIL-SHAPED CLIP):  - COLUMNAR CELL CHANGE WITH ASSOCIATED CALCIFICATIONS.  - NEGATIVE FOR ATYPIA AND MALIGNANCY. BIOPSY- ER > 90%; PR-NEG; Her-2 NEG -----------------------------------------------------------------  TNM Descriptors: M (multifocal)  pT1a  Regional Lymph Nodes Modifier: SN  pN0  pM - Not applicable   #  HIGH RISK ONCOTYPE- RS score =35; which unfortunately translates to up to 23% risk of distant recurrence at 9 years.  Benefit from chemotherapy is more than 15%.  # JUne 2nd 2022- START TAX-CYTOXAN  q3w x4. S/p RT  #October 2022-anastrozole ; # FEB 2022-plan adjuvant Zometa  every 6 months 3-5 years   Carcinoma of upper-outer quadrant of left breast in female, estrogen receptor positive (HCC)  06/06/2020 Initial Diagnosis   Carcinoma of upper-outer quadrant  of left breast in female, estrogen receptor positive (HCC)   06/06/2020 Cancer Staging   Staging form: Breast, AJCC 8th Edition - Clinical: Stage IB (cT1b, cN0, cM0, G3, ER+, PR-, HER2-) - Signed by Rennie Cindy SAUNDERS, MD on 06/06/2020 Stage prefix: Initial diagnosis   09/16/2020 - 11/19/2020 Chemotherapy   Patient is on Treatment Plan : BREAST TC q21d       HISTORY OF PRESENTING ILLNESS: Patient ambulating-independently. Alone.  Siri  Haji 69 y.o.  female with stage I breast cancer ER/PR positive HER2 negative -currently on adjuvant anastrozole  is here for follow-up.  Discussed the use of AI scribe software for clinical note transcription with the patient, who gave verbal consent to proceed.  History of Present Illness   Jean  Colton is a 69 year old female with stage I ER/PR positive, HER2 negative breast cancer on anastrozole  who presents for evaluation of new jaw and ear discomfort.  She is maintained on anastrozole  for endocrine therapy and previously completed three years of Zometa  infusions for bone health. She remains physically active without new or worsening joint pain, bone pain, or other musculoskeletal complaints. Hot flashes persist but are stable. Her most recent mammogram on December 25 was normal. Bone density testing has shown osteopenia.  Over the past two weeks, she has experienced discomfort radiating from her jaw to the back of her ear. She is uncertain if this represents an earache and notes no pain with mastication. There is no dental pain, and she does not have a tooth in the affected area. She denies fever, pharyngitis, or other systemic symptoms. She plans to follow up with her dentist next week for further evaluation.      Review of Systems  Constitutional:  Negative for chills, diaphoresis, fever, malaise/fatigue and weight loss.  HENT:  Negative for nosebleeds and sore throat.   Eyes:  Negative for double vision.  Respiratory:  Negative for cough,  hemoptysis, sputum production, shortness of breath and wheezing.   Cardiovascular:  Negative for chest pain, palpitations, orthopnea and leg swelling.  Gastrointestinal:  Negative for abdominal pain, blood in stool, constipation, diarrhea, heartburn, melena, nausea and vomiting.  Genitourinary:  Negative for dysuria, frequency and urgency.  Musculoskeletal:  Negative for back pain and joint pain.  Skin: Negative.  Negative for itching and rash.  Neurological:  Negative for dizziness, tingling, focal weakness, weakness and headaches.  Endo/Heme/Allergies:  Does not bruise/bleed easily.  Psychiatric/Behavioral:  Negative for depression. The patient is not nervous/anxious and does not have insomnia.     MEDICAL HISTORY:  Past Medical History:  Diagnosis Date   Carcinoma of upper-outer quadrant of left breast in female, estrogen receptor positive (HCC) 06/06/2020   Diabetes mellitus without  complication (HCC)    diet controlled   Family history of breast cancer    Family history of ovarian cancer    GERD (gastroesophageal reflux disease)    History of hiatal hernia    Hyperlipidemia    Hypertension    Migraines     SURGICAL HISTORY: Past Surgical History:  Procedure Laterality Date   BREAST BIOPSY Left 05/29/2020   affirm bx X clp-invasive   BREAST BIOPSY Left 05/29/2020   Affirm bxCoil clip-invasive   BREAST BIOPSY Right 2001   excisional biopsy   BREAST EXCISIONAL BIOPSY Right 06/19/1999   neg   BREAST LUMPECTOMY     BREAST LUMPECTOMY,RADIO FREQ LOCALIZER,AXILLARY SENTINEL LYMPH NODE BIOPSY Left 07/12/2020   Procedure: BREAST LUMPECTOMY,RADIO FREQ LOCALIZER,AXILLARY SENTINEL LYMPH NODE BIOPSY;  Surgeon: Marolyn Nest, MD;  Location: [facility] ORS;  Service: General;  Laterality: Left;   COLONOSCOPY  02/15/2009   EXCISION OF BREAST BIOPSY Left 07/12/2020   tag placement 07/10/20 vison tag [id number]; hourglass tag [id number]; cylinder tag [id number]   TUBAL LIGATION      SOCIAL  HISTORY: Social History   Socioeconomic History   Marital status: Married    Spouse name: Darrell   Number of children: Not on file   Years of education: Not on file   Highest education level: Not on file  Occupational History   Not on file  Tobacco Use   Smoking status: Former    Current packs/day: 0.00    Types: Cigarettes    Start date: 2000    Quit date: 2020    Years since quitting: 6.0   Smokeless tobacco: Never  Vaping Use   Vaping status: Never Used  Substance and Sexual Activity   Alcohol use: Never   Drug use: Never   Sexual activity: Not on file  Other Topics Concern   Not on file  Social History Narrative   Not on file   Social Drivers of Health   Tobacco Use: Medium Risk (05/04/2024)   Patient History    Smoking Tobacco Use: Former    Smokeless Tobacco Use: Never    Passive Exposure: Not on Actuary Strain: Low Risk  (01/28/2024)   [facility]   Overall Financial Resource Strain (CARDIA)    Difficulty of Paying Living Expenses: Not hard at all  Food Insecurity: No Food Insecurity (01/28/2024)   Received from [facility]   Epic    Within the past 12 months, you worried that your food would run out before you got the money to buy more.: Never true    Within the past 12 months, the food you bought just didn't last and you didn't have money to get more.: Never true  Transportation Needs: No Transportation Needs (01/28/2024)   Received from [facility] - Transportation    In the past 12 months, has lack of transportation kept you from medical appointments or from getting medications?: No    Lack of Transportation (Non-Medical): No  Physical Activity: Not on file  Stress: Not on file  Social Connections: Not on file  Intimate Partner Violence: Not on file  Depression (EYV7-0): Low Risk (05/04/2024)   Depression (PHQ2-9)    PHQ-2 Score: 0  Alcohol Screen: Not on  file  Housing: Low Risk  (01/28/2024)   Received from [facility]   Epic    In the last 12 months, was there a time when you were  not able to pay the mortgage or rent on time?: No    In the past 12 months, how many times have you moved where you were living?: 0    At any time in the past 12 months, were you homeless or living in a shelter (including now)?: No  Utilities: Not At Risk (01/28/2024)   Received from [facility]   Epic    In the past 12 months has the electric, gas, oil, or water company threatened to shut off services in your home?: No  Health Literacy: Not on file    FAMILY HISTORY: Family History  Problem Relation Age of Onset   Ovarian cancer Sister 17   Breast cancer Maternal Grandmother        dx 30s   Breast cancer Paternal Grandmother     ALLERGIES:  has no known allergies.  MEDICATIONS:  Current Outpatient Medications  Medication Sig Dispense Refill   anastrozole  (ARIMIDEX ) 1 MG tablet TAKE 1 TABLET DAILY 90 tablet 1   aspirin-acetaminophen -caffeine (EXCEDRIN MIGRAINE) 250-250-65 MG tablet Take 1 tablet by mouth daily as needed (headaches).     atorvastatin (LIPITOR) 10 MG tablet Take 10 mg by mouth daily.     chlorhexidine  (PERIDEX ) 0.12 % solution DIP TOOTHBRUSH IN SOLUTION AND USE TO BRUSH AROUND IMPLANTS TWICE A DAY     Cholecalciferol 25 MCG (1000 UT) tablet Take 2,000 Units by mouth daily.     levothyroxine (SYNTHROID) 25 MCG tablet Take 25 mcg by mouth.     magnesium oxide (MAG-OX) 400 MG tablet Take 400 mg by mouth daily.     Omega-3 Fatty Acids (FISH OIL) 1000 MG CAPS Take 1,000 mg by mouth daily.     omeprazole (PRILOSEC) 20 MG capsule Take 20 mg by mouth daily.     valsartan (DIOVAN) 80 MG tablet Take 80 mg by mouth daily.     No current facility-administered medications for this visit.    PHYSICAL EXAMINATION:   Vitals:   05/04/24 0951 05/04/24 1009  BP: (!) 147/89 (!) 142/94  Pulse: 65   Resp: 12    Temp: (!) 95.9 F (35.5 C)   SpO2: 100%    Filed Weights   05/04/24 0951  Weight: 160 lb 9.6 oz (72.8 kg)   Mild congestion noted in the right ear; examination of the jaw-mandible no evidence of any infection.  Or concern for ONJ  Physical Exam Vitals and nursing note reviewed.  HENT:     Head: Normocephalic and atraumatic.     Mouth/Throat:     Pharynx: Oropharynx is clear.  Eyes:     Extraocular Movements: Extraocular movements intact.     Pupils: Pupils are equal, round, and reactive to light.  Cardiovascular:     Rate and Rhythm: Normal rate and regular rhythm.  Pulmonary:     Comments: Decreased breath sounds bilaterally.  Abdominal:     Palpations: Abdomen is soft.  Musculoskeletal:        General: Normal range of motion.     Cervical back: Normal range of motion.  Skin:    General: Skin is warm.  Neurological:     General: No focal deficit present.     Mental Status: She is alert and oriented to person, place, and time.  Psychiatric:        Behavior: Behavior normal.        Judgment: Judgment normal.     LABORATORY DATA:  I have reviewed the data as listed  Lab Results  Component Value Date   WBC 5.0 05/04/2024   HGB 12.6 05/04/2024   HCT 38.4 05/04/2024   MCV 90.4 05/04/2024   PLT 211 05/04/2024   Recent Labs    05/05/23 1308 11/02/23 1353 05/04/24 0953  NA 142 140 141  K 4.2 4.0 4.0  CL 108 108 106  CO2 26 25 25   GLUCOSE 95 105* 98  BUN 20 24* 18  CREATININE 0.80 0.97 0.86  CALCIUM 9.0 9.4 9.6  GFRNONAA >60 >60 >60  PROT 6.7 6.9 6.9  ALBUMIN 4.2 4.5 4.7  AST 20 21 28   ALT 21 20 28   ALKPHOS 49 48 56  BILITOT 0.5 0.7 0.3    RADIOGRAPHIC STUDIES: I have personally reviewed the radiological images as listed and agreed with the findings in the report. No results found.   Carcinoma of upper-outer quadrant of left breast in female, estrogen receptor positive (HCC) #Stage I breast cancer- pT1a [5 mm; 4 mm] grade 3; pN0- ER positive PR  negative; HER2 negative. HIGH RISK ONCOTYPE- RS score =35. Currently on anastrazole [oct 2022]; continue  with anastrozole + adjuvant zometa - tolerating well. Mammo BIL- DEC 2025- WNL.   # continue anastrozole  [plan extended AI- for 10 years- will finish fall 2032]. Pt will call for refills.   # Ear ache- [dentist- Dr.Orr next week]- recommend claritin- D/ daily- x 1 week.   # Hot flashes- G-1- monitor for now.   # DEC 2026- -bone density osteopenias/p adjuvant Zometa  [JAN 2022] q 6 m x3 years [2025-FALL].  continue exercises; vit D + ca+vit D. Patient currently exercises/walks 2-3 miles a day.    # DISPOSITION: # no zometa  # follow up in 6 months- MD; labs- cbc/cmp;vit D 25-OH  Dr.B      Cindy JONELLE Joe, MD 05/04/2024 10:50 AM

## 2024-05-09 ENCOUNTER — Ambulatory Visit: Payer: Self-pay | Admitting: Internal Medicine

## 2024-11-01 ENCOUNTER — Inpatient Hospital Stay: Admitting: Internal Medicine

## 2024-11-01 ENCOUNTER — Inpatient Hospital Stay

## 2025-02-22 ENCOUNTER — Ambulatory Visit
